# Patient Record
Sex: Male | Born: 1950 | Race: White | Hispanic: No | Marital: Married | Smoking: Never smoker
Health system: Southern US, Community
[De-identification: ages and names within clinical notes are randomized; demographics above are authoritative.]

## PROBLEM LIST (undated history)

## (undated) DIAGNOSIS — M67919 Unspecified disorder of synovium and tendon, unspecified shoulder: Secondary | ICD-10-CM

## (undated) DIAGNOSIS — R5383 Other fatigue: Secondary | ICD-10-CM

## (undated) DIAGNOSIS — F329 Major depressive disorder, single episode, unspecified: Secondary | ICD-10-CM

## (undated) DIAGNOSIS — M5030 Other cervical disc degeneration, unspecified cervical region: Secondary | ICD-10-CM

## (undated) DIAGNOSIS — E785 Hyperlipidemia, unspecified: Secondary | ICD-10-CM

## (undated) DIAGNOSIS — M719 Bursopathy, unspecified: Secondary | ICD-10-CM

## (undated) DIAGNOSIS — A4902 Methicillin resistant Staphylococcus aureus infection, unspecified site: Secondary | ICD-10-CM

## (undated) DIAGNOSIS — M7710 Lateral epicondylitis, unspecified elbow: Secondary | ICD-10-CM

## (undated) DIAGNOSIS — E039 Hypothyroidism, unspecified: Secondary | ICD-10-CM

## (undated) DIAGNOSIS — R0600 Dyspnea, unspecified: Secondary | ICD-10-CM

## (undated) DIAGNOSIS — M25519 Pain in unspecified shoulder: Secondary | ICD-10-CM

## (undated) DIAGNOSIS — I10 Essential (primary) hypertension: Secondary | ICD-10-CM

## (undated) DIAGNOSIS — J8410 Pulmonary fibrosis, unspecified: Secondary | ICD-10-CM

## (undated) DIAGNOSIS — F32A Depression, unspecified: Secondary | ICD-10-CM

## (undated) DIAGNOSIS — A6000 Herpesviral infection of urogenital system, unspecified: Secondary | ICD-10-CM

## (undated) DIAGNOSIS — R7301 Impaired fasting glucose: Secondary | ICD-10-CM

## (undated) DIAGNOSIS — M5430 Sciatica, unspecified side: Secondary | ICD-10-CM

## (undated) DIAGNOSIS — E119 Type 2 diabetes mellitus without complications: Secondary | ICD-10-CM

## (undated) DIAGNOSIS — M51369 Other intervertebral disc degeneration, lumbar region without mention of lumbar back pain or lower extremity pain: Secondary | ICD-10-CM

## (undated) DIAGNOSIS — IMO0002 Reserved for concepts with insufficient information to code with codable children: Secondary | ICD-10-CM

## (undated) DIAGNOSIS — M5136 Other intervertebral disc degeneration, lumbar region: Secondary | ICD-10-CM

## (undated) DIAGNOSIS — Z9889 Other specified postprocedural states: Secondary | ICD-10-CM

## (undated) DIAGNOSIS — M1610 Unilateral primary osteoarthritis, unspecified hip: Secondary | ICD-10-CM

## (undated) DIAGNOSIS — M5137 Other intervertebral disc degeneration, lumbosacral region: Secondary | ICD-10-CM

## (undated) HISTORY — DX: Hyperlipidemia, unspecified: E78.5

## (undated) HISTORY — DX: Type 2 diabetes mellitus without complications: E11.9

## (undated) HISTORY — DX: Unspecified disorder of synovium and tendon, unspecified shoulder: M67.919

## (undated) HISTORY — DX: Unilateral primary osteoarthritis, unspecified hip: M16.10

## (undated) HISTORY — DX: Sciatica, unspecified side: M54.30

## (undated) HISTORY — DX: Impaired fasting glucose: R73.01

## (undated) HISTORY — PX: ACHILLES TENDON REPAIR: SUR1153

## (undated) HISTORY — PX: OTHER SURGICAL HISTORY: SHX169

## (undated) HISTORY — DX: Herpesviral infection of urogenital system, unspecified: A60.00

## (undated) HISTORY — DX: Major depressive disorder, single episode, unspecified: F32.9

## (undated) HISTORY — DX: Other intervertebral disc degeneration, lumbar region: M51.36

## (undated) HISTORY — DX: Bursopathy, unspecified: M71.9

## (undated) HISTORY — DX: Reserved for concepts with insufficient information to code with codable children: IMO0002

## (undated) HISTORY — DX: Other fatigue: R53.83

## (undated) HISTORY — DX: Other cervical disc degeneration, unspecified cervical region: M50.30

## (undated) HISTORY — DX: Other intervertebral disc degeneration, lumbar region without mention of lumbar back pain or lower extremity pain: M51.369

## (undated) HISTORY — DX: Depression, unspecified: F32.A

## (undated) HISTORY — DX: Other intervertebral disc degeneration, lumbosacral region: M51.37

## (undated) HISTORY — DX: Essential (primary) hypertension: I10

## (undated) HISTORY — PX: COLONOSCOPY: SHX174

## (undated) HISTORY — DX: Lateral epicondylitis, unspecified elbow: M77.10

## (undated) HISTORY — DX: Hypothyroidism, unspecified: E03.9

## (undated) HISTORY — DX: Pain in unspecified shoulder: M25.519

---

## 1995-02-27 HISTORY — PX: ROTATOR CUFF REPAIR: SHX139

## 2001-07-17 ENCOUNTER — Ambulatory Visit (HOSPITAL_BASED_OUTPATIENT_CLINIC_OR_DEPARTMENT_OTHER): Admission: RE | Admit: 2001-07-17 | Discharge: 2001-07-17 | Payer: Self-pay | Admitting: *Deleted

## 2002-04-01 ENCOUNTER — Encounter: Admission: RE | Admit: 2002-04-01 | Discharge: 2002-04-01 | Payer: Self-pay | Admitting: Neurological Surgery

## 2002-04-01 ENCOUNTER — Encounter: Payer: Self-pay | Admitting: Neurological Surgery

## 2002-07-03 ENCOUNTER — Encounter: Payer: Self-pay | Admitting: Family Medicine

## 2002-07-03 ENCOUNTER — Encounter: Admission: RE | Admit: 2002-07-03 | Discharge: 2002-07-03 | Payer: Self-pay | Admitting: Family Medicine

## 2003-02-27 HISTORY — PX: ELBOW SURGERY: SHX618

## 2003-08-09 ENCOUNTER — Encounter: Admission: RE | Admit: 2003-08-09 | Discharge: 2003-11-07 | Payer: Self-pay | Admitting: Family Medicine

## 2004-02-29 ENCOUNTER — Ambulatory Visit: Payer: Self-pay | Admitting: Family Medicine

## 2004-03-16 ENCOUNTER — Ambulatory Visit: Payer: Self-pay | Admitting: Family Medicine

## 2004-03-27 ENCOUNTER — Ambulatory Visit: Payer: Self-pay | Admitting: Family Medicine

## 2004-07-04 ENCOUNTER — Ambulatory Visit: Payer: Self-pay | Admitting: Family Medicine

## 2004-12-26 ENCOUNTER — Ambulatory Visit: Payer: Self-pay | Admitting: Family Medicine

## 2005-01-11 ENCOUNTER — Ambulatory Visit: Payer: Self-pay | Admitting: Family Medicine

## 2005-02-26 HISTORY — PX: KNEE ARTHROSCOPY: SUR90

## 2005-06-12 ENCOUNTER — Ambulatory Visit: Payer: Self-pay | Admitting: Family Medicine

## 2005-06-19 ENCOUNTER — Ambulatory Visit: Payer: Self-pay | Admitting: Family Medicine

## 2006-01-15 ENCOUNTER — Ambulatory Visit: Payer: Self-pay | Admitting: Family Medicine

## 2006-03-27 ENCOUNTER — Ambulatory Visit: Payer: Self-pay | Admitting: Family Medicine

## 2006-03-27 LAB — CONVERTED CEMR LAB
Cholesterol: 275 mg/dL (ref 0–200)
Hgb A1c MFr Bld: 7.2 % — ABNORMAL HIGH (ref 4.6–6.0)

## 2006-04-21 ENCOUNTER — Ambulatory Visit: Payer: Self-pay | Admitting: Internal Medicine

## 2006-04-21 ENCOUNTER — Inpatient Hospital Stay (HOSPITAL_COMMUNITY): Admission: EM | Admit: 2006-04-21 | Discharge: 2006-04-26 | Payer: Self-pay | Admitting: Podiatry

## 2006-04-30 ENCOUNTER — Ambulatory Visit: Payer: Self-pay | Admitting: Family Medicine

## 2006-07-16 ENCOUNTER — Ambulatory Visit: Payer: Self-pay | Admitting: Family Medicine

## 2006-07-16 LAB — CONVERTED CEMR LAB
Alkaline Phosphatase: 26 units/L — ABNORMAL LOW (ref 39–117)
BUN: 17 mg/dL (ref 6–23)
Basophils Relative: 0.6 % (ref 0.0–1.0)
CO2: 32 meq/L (ref 19–32)
Creatinine, Ser: 1.2 mg/dL (ref 0.4–1.5)
Eosinophils Relative: 8.4 % — ABNORMAL HIGH (ref 0.0–5.0)
Glucose, Bld: 141 mg/dL — ABNORMAL HIGH (ref 70–99)
HCT: 42.9 % (ref 39.0–52.0)
Hemoglobin: 14.8 g/dL (ref 13.0–17.0)
LDL Cholesterol: 119 mg/dL — ABNORMAL HIGH (ref 0–99)
Microalb, Ur: 0.3 mg/dL (ref 0.0–1.9)
Monocytes Absolute: 0.5 10*3/uL (ref 0.2–0.7)
Neutrophils Relative %: 57.8 % (ref 43.0–77.0)
Potassium: 4 meq/L (ref 3.5–5.1)
RDW: 12.5 % (ref 11.5–14.6)
TSH: 4.85 microintl units/mL (ref 0.35–5.50)
Total Bilirubin: 1.1 mg/dL (ref 0.3–1.2)
Total Protein: 7.1 g/dL (ref 6.0–8.3)
VLDL: 32 mg/dL (ref 0–40)
WBC: 6 10*3/uL (ref 4.5–10.5)

## 2006-07-23 ENCOUNTER — Ambulatory Visit: Payer: Self-pay | Admitting: Family Medicine

## 2006-10-02 ENCOUNTER — Encounter: Payer: Self-pay | Admitting: Family Medicine

## 2006-11-25 DIAGNOSIS — F329 Major depressive disorder, single episode, unspecified: Secondary | ICD-10-CM | POA: Insufficient documentation

## 2006-11-25 DIAGNOSIS — I10 Essential (primary) hypertension: Secondary | ICD-10-CM | POA: Insufficient documentation

## 2006-11-25 DIAGNOSIS — F3289 Other specified depressive episodes: Secondary | ICD-10-CM

## 2006-11-25 HISTORY — DX: Essential (primary) hypertension: I10

## 2006-11-25 HISTORY — DX: Major depressive disorder, single episode, unspecified: F32.9

## 2006-11-25 HISTORY — DX: Other specified depressive episodes: F32.89

## 2006-11-27 ENCOUNTER — Ambulatory Visit: Payer: Self-pay | Admitting: Family Medicine

## 2006-11-27 DIAGNOSIS — M5030 Other cervical disc degeneration, unspecified cervical region: Secondary | ICD-10-CM

## 2006-11-27 DIAGNOSIS — M5137 Other intervertebral disc degeneration, lumbosacral region: Secondary | ICD-10-CM | POA: Insufficient documentation

## 2006-11-27 DIAGNOSIS — M51379 Other intervertebral disc degeneration, lumbosacral region without mention of lumbar back pain or lower extremity pain: Secondary | ICD-10-CM

## 2006-11-27 HISTORY — DX: Other intervertebral disc degeneration, lumbosacral region: M51.37

## 2006-11-27 HISTORY — DX: Other intervertebral disc degeneration, lumbosacral region without mention of lumbar back pain or lower extremity pain: M51.379

## 2006-11-27 HISTORY — DX: Other cervical disc degeneration, unspecified cervical region: M50.30

## 2006-11-28 ENCOUNTER — Telehealth: Payer: Self-pay | Admitting: Family Medicine

## 2006-12-12 ENCOUNTER — Telehealth: Payer: Self-pay | Admitting: Family Medicine

## 2006-12-26 ENCOUNTER — Ambulatory Visit: Payer: Self-pay | Admitting: Family Medicine

## 2006-12-26 DIAGNOSIS — R7301 Impaired fasting glucose: Secondary | ICD-10-CM

## 2006-12-26 HISTORY — DX: Impaired fasting glucose: R73.01

## 2007-01-13 ENCOUNTER — Ambulatory Visit: Payer: Self-pay | Admitting: Internal Medicine

## 2007-01-13 DIAGNOSIS — R300 Dysuria: Secondary | ICD-10-CM | POA: Insufficient documentation

## 2007-01-13 LAB — CONVERTED CEMR LAB
Blood in Urine, dipstick: NEGATIVE
Nitrite: NEGATIVE
Protein, U semiquant: NEGATIVE
WBC Urine, dipstick: NEGATIVE

## 2007-01-14 ENCOUNTER — Encounter: Payer: Self-pay | Admitting: Internal Medicine

## 2007-01-16 ENCOUNTER — Encounter: Payer: Self-pay | Admitting: Family Medicine

## 2007-01-16 LAB — CONVERTED CEMR LAB
Cholesterol: 189 mg/dL (ref 0–200)
Hgb A1c MFr Bld: 6.5 % — ABNORMAL HIGH (ref 4.6–6.0)
Total CHOL/HDL Ratio: 4.5
Triglycerides: 181 mg/dL — ABNORMAL HIGH (ref 0–149)

## 2007-02-05 ENCOUNTER — Telehealth: Payer: Self-pay | Admitting: Family Medicine

## 2007-03-31 ENCOUNTER — Ambulatory Visit: Payer: Self-pay | Admitting: Cardiology

## 2007-03-31 ENCOUNTER — Ambulatory Visit: Payer: Self-pay | Admitting: Family Medicine

## 2007-03-31 ENCOUNTER — Ambulatory Visit: Payer: Self-pay | Admitting: Internal Medicine

## 2007-03-31 DIAGNOSIS — E785 Hyperlipidemia, unspecified: Secondary | ICD-10-CM

## 2007-03-31 HISTORY — DX: Hyperlipidemia, unspecified: E78.5

## 2007-04-01 ENCOUNTER — Encounter: Payer: Self-pay | Admitting: Family Medicine

## 2007-04-02 ENCOUNTER — Inpatient Hospital Stay (HOSPITAL_COMMUNITY): Admission: EM | Admit: 2007-04-02 | Discharge: 2007-04-03 | Payer: Self-pay | Admitting: Emergency Medicine

## 2007-04-03 ENCOUNTER — Encounter: Payer: Self-pay | Admitting: Internal Medicine

## 2007-04-10 ENCOUNTER — Ambulatory Visit: Payer: Self-pay | Admitting: Family Medicine

## 2007-04-10 DIAGNOSIS — E119 Type 2 diabetes mellitus without complications: Secondary | ICD-10-CM | POA: Insufficient documentation

## 2007-04-10 DIAGNOSIS — R079 Chest pain, unspecified: Secondary | ICD-10-CM | POA: Insufficient documentation

## 2007-04-10 HISTORY — DX: Type 2 diabetes mellitus without complications: E11.9

## 2007-04-10 LAB — CONVERTED CEMR LAB
Hgb A1c MFr Bld: 6.9 % — ABNORMAL HIGH (ref 4.6–6.0)
Total CHOL/HDL Ratio: 4.9
Triglycerides: 106 mg/dL (ref 0–149)
VLDL: 21 mg/dL (ref 0–40)

## 2007-04-29 ENCOUNTER — Telehealth: Payer: Self-pay | Admitting: Family Medicine

## 2007-04-29 ENCOUNTER — Ambulatory Visit: Payer: Self-pay | Admitting: Family Medicine

## 2007-05-05 DIAGNOSIS — M7710 Lateral epicondylitis, unspecified elbow: Secondary | ICD-10-CM | POA: Insufficient documentation

## 2007-05-05 HISTORY — DX: Lateral epicondylitis, unspecified elbow: M77.10

## 2007-05-07 ENCOUNTER — Encounter: Payer: Self-pay | Admitting: Family Medicine

## 2007-06-04 ENCOUNTER — Telehealth: Payer: Self-pay | Admitting: Family Medicine

## 2007-07-16 ENCOUNTER — Telehealth: Payer: Self-pay | Admitting: Family Medicine

## 2007-07-22 ENCOUNTER — Ambulatory Visit: Payer: Self-pay | Admitting: Family Medicine

## 2007-07-22 LAB — CONVERTED CEMR LAB
Nitrite: NEGATIVE
Specific Gravity, Urine: 1.01
WBC Urine, dipstick: NEGATIVE

## 2007-07-30 ENCOUNTER — Ambulatory Visit: Payer: Self-pay | Admitting: Family Medicine

## 2007-07-30 LAB — CONVERTED CEMR LAB
ALT: 25 units/L (ref 0–53)
AST: 19 units/L (ref 0–37)
Albumin: 4.3 g/dL (ref 3.5–5.2)
Alkaline Phosphatase: 35 units/L — ABNORMAL LOW (ref 39–117)
BUN: 14 mg/dL (ref 6–23)
CO2: 30 meq/L (ref 19–32)
Calcium: 9.6 mg/dL (ref 8.4–10.5)
Creatinine,U: 65.6 mg/dL
Eosinophils Relative: 5 % (ref 0.0–5.0)
GFR calc Af Amer: 89 mL/min
Glucose, Bld: 157 mg/dL — ABNORMAL HIGH (ref 70–99)
HCT: 41.7 % (ref 39.0–52.0)
HDL: 35.5 mg/dL — ABNORMAL LOW (ref >39.0)
Hemoglobin: 14.3 g/dL (ref 13.0–17.0)
Microalb Creat Ratio: 6.1 mg/g (ref 0.0–30.0)
Monocytes Absolute: 0.5 10*3/uL (ref 0.1–1.0)
Monocytes Relative: 8.9 % (ref 3.0–12.0)
Neutro Abs: 3.6 10*3/uL (ref 1.4–7.7)
Platelets: 167 10*3/uL (ref 150–400)
Potassium: 4.6 meq/L (ref 3.5–5.1)
RDW: 12.7 % (ref 11.5–14.6)
Total CHOL/HDL Ratio: 5.1
Total Protein: 7.1 g/dL (ref 6.0–8.3)
WBC: 5.7 10*3/uL (ref 4.5–10.5)

## 2007-08-04 LAB — CONVERTED CEMR LAB: Herpes Simplex Vrs I&II-IgM Ab (EIA): NOT DETECTED

## 2007-08-05 ENCOUNTER — Ambulatory Visit: Payer: Self-pay | Admitting: Family Medicine

## 2007-08-07 ENCOUNTER — Telehealth: Payer: Self-pay | Admitting: Family Medicine

## 2007-08-12 ENCOUNTER — Telehealth: Payer: Self-pay | Admitting: Family Medicine

## 2007-08-13 ENCOUNTER — Ambulatory Visit: Payer: Self-pay | Admitting: Family Medicine

## 2007-08-28 ENCOUNTER — Ambulatory Visit: Payer: Self-pay | Admitting: Family Medicine

## 2007-08-28 DIAGNOSIS — B356 Tinea cruris: Secondary | ICD-10-CM | POA: Insufficient documentation

## 2007-09-17 ENCOUNTER — Ambulatory Visit: Payer: Self-pay | Admitting: Family Medicine

## 2007-09-17 DIAGNOSIS — A6000 Herpesviral infection of urogenital system, unspecified: Secondary | ICD-10-CM

## 2007-09-17 DIAGNOSIS — T887XXA Unspecified adverse effect of drug or medicament, initial encounter: Secondary | ICD-10-CM | POA: Insufficient documentation

## 2007-09-17 HISTORY — DX: Herpesviral infection of urogenital system, unspecified: A60.00

## 2007-09-30 ENCOUNTER — Telehealth: Payer: Self-pay | Admitting: Family Medicine

## 2007-10-14 ENCOUNTER — Telehealth: Payer: Self-pay | Admitting: Family Medicine

## 2007-10-15 ENCOUNTER — Telehealth: Payer: Self-pay | Admitting: *Deleted

## 2007-10-28 ENCOUNTER — Ambulatory Visit: Payer: Self-pay | Admitting: Family Medicine

## 2007-10-28 DIAGNOSIS — M5430 Sciatica, unspecified side: Secondary | ICD-10-CM

## 2007-10-28 DIAGNOSIS — E039 Hypothyroidism, unspecified: Secondary | ICD-10-CM

## 2007-10-28 HISTORY — DX: Hypothyroidism, unspecified: E03.9

## 2007-10-28 HISTORY — DX: Sciatica, unspecified side: M54.30

## 2007-11-04 LAB — CONVERTED CEMR LAB
Hgb A1c MFr Bld: 6.8 % — ABNORMAL HIGH (ref 4.6–6.0)
TSH: 7.94 microintl units/mL — ABNORMAL HIGH (ref 0.35–5.50)
Total Protein: 7.2 g/dL (ref 6.0–8.3)

## 2007-11-19 ENCOUNTER — Ambulatory Visit: Payer: Self-pay | Admitting: Family Medicine

## 2007-11-19 LAB — CONVERTED CEMR LAB
Glucose, Urine, Semiquant: NEGATIVE
Nitrite: NEGATIVE
Specific Gravity, Urine: 1.01
WBC Urine, dipstick: NEGATIVE
pH: 6.5

## 2007-12-10 ENCOUNTER — Ambulatory Visit: Payer: Self-pay | Admitting: Family Medicine

## 2007-12-31 ENCOUNTER — Telehealth: Payer: Self-pay | Admitting: Family Medicine

## 2008-04-01 ENCOUNTER — Telehealth: Payer: Self-pay | Admitting: Family Medicine

## 2008-04-08 ENCOUNTER — Ambulatory Visit: Payer: Self-pay | Admitting: Family Medicine

## 2008-04-08 DIAGNOSIS — B002 Herpesviral gingivostomatitis and pharyngotonsillitis: Secondary | ICD-10-CM | POA: Insufficient documentation

## 2008-05-05 ENCOUNTER — Telehealth: Payer: Self-pay | Admitting: Family Medicine

## 2008-05-11 ENCOUNTER — Telehealth: Payer: Self-pay | Admitting: Family Medicine

## 2008-05-20 ENCOUNTER — Ambulatory Visit: Payer: Self-pay | Admitting: Family Medicine

## 2008-05-20 DIAGNOSIS — M67919 Unspecified disorder of synovium and tendon, unspecified shoulder: Secondary | ICD-10-CM

## 2008-05-20 HISTORY — DX: Unspecified disorder of synovium and tendon, unspecified shoulder: M67.919

## 2008-05-27 ENCOUNTER — Ambulatory Visit: Payer: Self-pay | Admitting: Family Medicine

## 2008-05-27 DIAGNOSIS — K121 Other forms of stomatitis: Secondary | ICD-10-CM | POA: Insufficient documentation

## 2008-05-27 DIAGNOSIS — K1230 Oral mucositis (ulcerative), unspecified: Secondary | ICD-10-CM

## 2008-05-27 LAB — CONVERTED CEMR LAB
ALT: 28 units/L (ref 0–53)
Alkaline Phosphatase: 33 units/L — ABNORMAL LOW (ref 39–117)
Bilirubin, Direct: 0.2 mg/dL (ref 0.0–0.3)
Total Bilirubin: 0.8 mg/dL (ref 0.3–1.2)
Total Protein: 7 g/dL (ref 6.0–8.3)

## 2008-05-28 ENCOUNTER — Encounter: Payer: Self-pay | Admitting: Family Medicine

## 2008-06-01 ENCOUNTER — Telehealth: Payer: Self-pay | Admitting: Family Medicine

## 2008-06-03 ENCOUNTER — Telehealth: Payer: Self-pay | Admitting: Family Medicine

## 2008-06-15 ENCOUNTER — Telehealth: Payer: Self-pay | Admitting: Family Medicine

## 2008-07-08 ENCOUNTER — Ambulatory Visit: Payer: Self-pay | Admitting: Family Medicine

## 2008-07-08 LAB — CONVERTED CEMR LAB
Blood in Urine, dipstick: NEGATIVE
Ketones, urine, test strip: NEGATIVE
Nitrite: NEGATIVE
Protein, U semiquant: NEGATIVE
Specific Gravity, Urine: <1.005
WBC Urine, dipstick: NEGATIVE
pH: 7

## 2008-07-13 ENCOUNTER — Telehealth (INDEPENDENT_AMBULATORY_CARE_PROVIDER_SITE_OTHER): Payer: Self-pay | Admitting: *Deleted

## 2008-07-15 ENCOUNTER — Ambulatory Visit: Payer: Self-pay | Admitting: Family Medicine

## 2008-07-20 ENCOUNTER — Encounter: Payer: Self-pay | Admitting: Family Medicine

## 2008-08-03 ENCOUNTER — Ambulatory Visit: Payer: Self-pay | Admitting: Family Medicine

## 2008-08-09 ENCOUNTER — Encounter: Payer: Self-pay | Admitting: Family Medicine

## 2008-08-10 ENCOUNTER — Ambulatory Visit: Payer: Self-pay | Admitting: Family Medicine

## 2008-08-10 DIAGNOSIS — M1610 Unilateral primary osteoarthritis, unspecified hip: Secondary | ICD-10-CM | POA: Insufficient documentation

## 2008-08-10 HISTORY — DX: Unilateral primary osteoarthritis, unspecified hip: M16.10

## 2008-08-13 ENCOUNTER — Telehealth: Payer: Self-pay | Admitting: Family Medicine

## 2008-08-18 ENCOUNTER — Ambulatory Visit: Payer: Self-pay | Admitting: Family Medicine

## 2008-08-18 DIAGNOSIS — M25519 Pain in unspecified shoulder: Secondary | ICD-10-CM

## 2008-08-18 HISTORY — DX: Pain in unspecified shoulder: M25.519

## 2008-08-19 ENCOUNTER — Telehealth: Payer: Self-pay | Admitting: Family Medicine

## 2008-08-20 ENCOUNTER — Encounter: Payer: Self-pay | Admitting: Family Medicine

## 2008-09-07 ENCOUNTER — Encounter: Admission: RE | Admit: 2008-09-07 | Discharge: 2008-09-07 | Payer: Self-pay | Admitting: Sports Medicine

## 2008-09-13 ENCOUNTER — Ambulatory Visit: Payer: Self-pay | Admitting: Family Medicine

## 2008-09-13 LAB — CONVERTED CEMR LAB
Bilirubin Urine: NEGATIVE
Blood in Urine, dipstick: NEGATIVE
Ketones, urine, test strip: NEGATIVE
Nitrite: NEGATIVE
Specific Gravity, Urine: 1.015
Urobilinogen, UA: 0.2

## 2008-09-21 ENCOUNTER — Encounter: Admission: RE | Admit: 2008-09-21 | Discharge: 2008-09-21 | Payer: Self-pay | Admitting: Sports Medicine

## 2008-09-21 ENCOUNTER — Ambulatory Visit: Payer: Self-pay | Admitting: Family Medicine

## 2008-09-21 LAB — CONVERTED CEMR LAB
Blood in Urine, dipstick: NEGATIVE
Nitrite: NEGATIVE
Specific Gravity, Urine: 1.02

## 2008-09-22 ENCOUNTER — Telehealth: Payer: Self-pay | Admitting: Family Medicine

## 2008-09-29 ENCOUNTER — Ambulatory Visit: Payer: Self-pay | Admitting: Family Medicine

## 2008-10-01 LAB — CONVERTED CEMR LAB
AST: 20 units/L (ref 0–37)
Albumin: 4.3 g/dL (ref 3.5–5.2)
Alkaline Phosphatase: 32 units/L — ABNORMAL LOW (ref 39–117)
BUN: 16 mg/dL (ref 6–23)
Basophils Absolute: 0 10*3/uL (ref 0.0–0.1)
Bilirubin, Direct: 0.1 mg/dL (ref 0.0–0.3)
CO2: 33 meq/L — ABNORMAL HIGH (ref 19–32)
Calcium: 9.6 mg/dL (ref 8.4–10.5)
Cholesterol: 206 mg/dL — ABNORMAL HIGH (ref 0–200)
Creatinine, Ser: 1.1 mg/dL (ref 0.4–1.5)
Creatinine,U: 132.9 mg/dL
Direct LDL: 132.2 mg/dL
Eosinophils Absolute: 0.3 10*3/uL (ref 0.0–0.7)
GFR calc non Af Amer: 73.44 mL/min (ref >60)
Glucose, Bld: 148 mg/dL — ABNORMAL HIGH (ref 70–99)
HDL: 55.6 mg/dL (ref >39.00)
Lymphocytes Relative: 25.2 % (ref 12.0–46.0)
MCHC: 34.6 g/dL (ref 30.0–36.0)
Microalb Creat Ratio: 4.5 mg/g (ref 0.0–30.0)
Microalb, Ur: 0.6 mg/dL (ref 0.0–1.9)
Monocytes Relative: 8.4 % (ref 3.0–12.0)
Neutro Abs: 5.1 10*3/uL (ref 1.4–7.7)
Neutrophils Relative %: 62.7 % (ref 43.0–77.0)
Platelets: 174 10*3/uL (ref 150.0–400.0)
RDW: 12.6 % (ref 11.5–14.6)
Sodium: 143 meq/L (ref 135–145)
Total Bilirubin: 1.1 mg/dL (ref 0.3–1.2)
Triglycerides: 167 mg/dL — ABNORMAL HIGH (ref 0.0–149.0)

## 2008-10-04 ENCOUNTER — Encounter: Payer: Self-pay | Admitting: Family Medicine

## 2008-10-05 ENCOUNTER — Telehealth: Payer: Self-pay | Admitting: Family Medicine

## 2008-10-06 ENCOUNTER — Encounter: Payer: Self-pay | Admitting: Family Medicine

## 2008-10-27 ENCOUNTER — Ambulatory Visit: Payer: Self-pay | Admitting: Family Medicine

## 2008-11-10 ENCOUNTER — Telehealth: Payer: Self-pay | Admitting: Family Medicine

## 2008-12-14 ENCOUNTER — Telehealth: Payer: Self-pay | Admitting: Family Medicine

## 2008-12-20 ENCOUNTER — Ambulatory Visit: Payer: Self-pay | Admitting: Family Medicine

## 2008-12-21 LAB — CONVERTED CEMR LAB: Hgb A1c MFr Bld: 7.3 % — ABNORMAL HIGH (ref 4.6–6.5)

## 2009-01-11 ENCOUNTER — Encounter (INDEPENDENT_AMBULATORY_CARE_PROVIDER_SITE_OTHER): Payer: Self-pay | Admitting: *Deleted

## 2009-03-02 ENCOUNTER — Encounter: Payer: Self-pay | Admitting: Family Medicine

## 2009-03-02 ENCOUNTER — Encounter: Admission: RE | Admit: 2009-03-02 | Discharge: 2009-03-02 | Payer: Self-pay | Admitting: Neurosurgery

## 2009-04-25 ENCOUNTER — Encounter: Payer: Self-pay | Admitting: Family Medicine

## 2009-05-25 ENCOUNTER — Telehealth: Payer: Self-pay | Admitting: Family Medicine

## 2009-06-02 ENCOUNTER — Ambulatory Visit: Payer: Self-pay | Admitting: Family Medicine

## 2009-06-08 LAB — CONVERTED CEMR LAB: Hgb A1c MFr Bld: 7.8 % — ABNORMAL HIGH (ref 4.6–6.5)

## 2009-08-31 ENCOUNTER — Telehealth: Payer: Self-pay | Admitting: Family Medicine

## 2009-09-15 ENCOUNTER — Ambulatory Visit: Payer: Self-pay | Admitting: Family Medicine

## 2009-09-22 ENCOUNTER — Ambulatory Visit: Payer: Self-pay | Admitting: Family Medicine

## 2009-09-25 LAB — CONVERTED CEMR LAB
BUN: 19 mg/dL (ref 6–23)
Basophils Absolute: 0 10*3/uL (ref 0.0–0.1)
Basophils Relative: 0.7 % (ref 0.0–3.0)
Bilirubin Urine: NEGATIVE
Bilirubin, Direct: 0.1 mg/dL (ref 0.0–0.3)
Blood in Urine, dipstick: NEGATIVE
CO2: 30 meq/L (ref 19–32)
Chloride: 104 meq/L (ref 96–112)
Cholesterol: 170 mg/dL (ref 0–200)
Creatinine, Ser: 1.2 mg/dL (ref 0.4–1.5)
Eosinophils Absolute: 0.3 10*3/uL (ref 0.0–0.7)
HDL: 42.3 mg/dL (ref >39.00)
Hgb A1c MFr Bld: 7.9 % — ABNORMAL HIGH (ref 4.6–6.5)
Ketones, urine, test strip: NEGATIVE
MCHC: 34.5 g/dL (ref 30.0–36.0)
MCV: 90.9 fL (ref 78.0–100.0)
Microalb Creat Ratio: 0.9 mg/g (ref 0.0–30.0)
Monocytes Absolute: 0.6 10*3/uL (ref 0.1–1.0)
Neutrophils Relative %: 55.5 % (ref 43.0–77.0)
PSA: 2.06 ng/mL (ref 0.10–4.00)
Platelets: 170 10*3/uL (ref 150.0–400.0)
RDW: 12.7 % (ref 11.5–14.6)
Specific Gravity, Urine: 1.02
Total Bilirubin: 0.8 mg/dL (ref 0.3–1.2)
Total Protein: 6.8 g/dL (ref 6.0–8.3)
Triglycerides: 155 mg/dL — ABNORMAL HIGH (ref 0.0–149.0)
Urobilinogen, UA: 0.2

## 2009-09-29 ENCOUNTER — Telehealth: Payer: Self-pay | Admitting: Family Medicine

## 2009-12-13 ENCOUNTER — Telehealth: Payer: Self-pay | Admitting: Family Medicine

## 2009-12-13 ENCOUNTER — Telehealth (INDEPENDENT_AMBULATORY_CARE_PROVIDER_SITE_OTHER): Payer: Self-pay | Admitting: *Deleted

## 2009-12-20 ENCOUNTER — Telehealth: Payer: Self-pay | Admitting: Family Medicine

## 2009-12-21 ENCOUNTER — Ambulatory Visit: Payer: Self-pay | Admitting: Family Medicine

## 2009-12-22 ENCOUNTER — Telehealth (INDEPENDENT_AMBULATORY_CARE_PROVIDER_SITE_OTHER): Payer: Self-pay | Admitting: *Deleted

## 2009-12-23 ENCOUNTER — Telehealth: Payer: Self-pay | Admitting: Family Medicine

## 2009-12-23 ENCOUNTER — Encounter: Payer: Self-pay | Admitting: Family Medicine

## 2009-12-23 ENCOUNTER — Ambulatory Visit: Payer: Self-pay | Admitting: Family Medicine

## 2009-12-29 ENCOUNTER — Encounter: Payer: Self-pay | Admitting: Family Medicine

## 2010-01-27 ENCOUNTER — Telehealth: Payer: Self-pay | Admitting: Family Medicine

## 2010-02-02 ENCOUNTER — Encounter: Payer: Self-pay | Admitting: Family Medicine

## 2010-02-14 ENCOUNTER — Telehealth: Payer: Self-pay | Admitting: Family Medicine

## 2010-03-20 ENCOUNTER — Encounter: Payer: Self-pay | Admitting: Sports Medicine

## 2010-03-23 ENCOUNTER — Telehealth: Payer: Self-pay | Admitting: Family Medicine

## 2010-03-25 ENCOUNTER — Encounter: Payer: Self-pay | Admitting: Family Medicine

## 2010-03-29 ENCOUNTER — Telehealth: Payer: Self-pay | Admitting: Family Medicine

## 2010-03-29 NOTE — Telephone Encounter (Signed)
Pt called and said that Dr Scotty Court spoke to pt on Tues 03/28/10 and said that he would have letter ready for pt pick up [id number]. Pt called back to get status of letter. Pls call when ready for pick up.

## 2010-03-29 NOTE — Telephone Encounter (Signed)
Per Dr. Scotty Court, he did speak with the pt but did not tell pt that he would write the letter. He told pt that he would call him

## 2010-03-30 ENCOUNTER — Encounter: Payer: Self-pay | Admitting: Family Medicine

## 2010-03-30 NOTE — Progress Notes (Signed)
Summary: sooner ov  Phone Note Call from Patient Call back at Home Phone [phone]   Caller: Patient Call For: Judithann Sheen MD Summary of Call: pt has sch ov with doc on 01/05/2010. pt is requesting sooner ov for consultation. Can I work him in ? Initial call taken by: Heron Sabins,  December 13, 2009 10:46 AM  Follow-up for Phone Call        we have spots tomorrow? Its okay if hes willing to see Dr Abner Greenspan? Is he wanting today? Follow-up by: Josph Macho RMA,  December 13, 2009 11:05 AM  Additional Follow-up for Phone Call Additional follow up Details #1::        pt unable to come in tomorrow due to back surgery sch for tomorrow. Pt stated he will be down for about 2 wks Additional Follow-up by: Heron Sabins,  December 13, 2009 11:21 AM    Additional Follow-up for Phone Call Additional follow up Details #2::    Pt states he is having back surgery tomorrow and wants Dr Abner Greenspan to look over notes for dissability? Pt states he could lose his job if paperwork doesnt' list everything that is wrong with him? I informed pt that the dissability should be done for his back surgery.  Per md pt would have to come in after back surgery to get paperwork completed with other problems. Back surgery is enough for disability. I will inform pt.  Pt states he would like Gina to return his call. Follow-up by: Josph Macho RMA,  December 13, 2009 11:34 AM  Additional Follow-up for Phone Call Additional follow up Details #3:: Details for Additional Follow-up Action Taken: states has excellent chance going out on retirement disabiltiy. needs to be seen by the 10 th of NOv. 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 [facility] --putting plates  in the facets  by  Dr Sterling Big. pt stateas really needs to see dr Suzanne Boron but is aware he is not here at the time.  MESS READ BACK TO PT AND PT VERBALIZED CORRECT.  I can not help him with his current predicament, he would have to come in to discuss in more detail Additional Follow-up by: Pura Spice, RN,  December 13, 2009 12:02 PM  Pt would like to know when he could be seen? I informed pt that he would have to let me know since he is having back surgery tomorrow? Pt then stated that hes not sure if hes having back surgery due to hes waiting on workmans comp to finalize something?  Resolved, spoke with patient.  Trixie Dredge  December 15, 2009 9:17 AM

## 2010-03-30 NOTE — Letter (Signed)
Summary: [facility]  [facility]   Imported By: Maryln Gottron 06/02/2009 13:27:00  _____________________________________________________________________  External Attachment:    Type:   Image     Comment:   External Document

## 2010-03-30 NOTE — Progress Notes (Signed)
Summary: Pt needs to get referral for pain management  Phone Note Call from Patient Call back at Home Phone [phone]   Caller: Patient Summary of Call: Pt called and said that Workers Comp is req that he get pain management for back injury and pt is req a referral from Dr. Scotty Court. Initial call taken by: Lucy Antigua,  August 31, 2009 8:45 AM  Follow-up for Phone Call        spoke with pt he stated no rush in this referrral but thinking may be the route to follow. Informed pt since has appt july 28 this could be discussed at time of appt or appt next week.  pt verbalized no rush and would discuss july 28 with dr stafffford appt.  Follow-up by: Pura Spice, RN,  August 31, 2009 9:31 AM

## 2010-03-30 NOTE — Progress Notes (Signed)
Summary: doc to rtc  Phone Note Call from Patient   Caller: Patient Call For: Judithann Sheen MD Summary of Call: pt would like dr blyth to call him concerning work issue. Initial call taken by: Heron Sabins,  December 13, 2009 10:49 AM  Follow-up for Phone Call        Pt called and said that it is imperative that he gets a call back asap. Pt is req that [place] call him back. Pls call. [phone]. Follow-up by: Lucy Antigua,  December 13, 2009 11:24 AM  Additional Follow-up for Phone Call Additional follow up Details #1::        Patient would have to come for a visit to evaluate Additional Follow-up by: Danise Edge MD,  December 13, 2009 1:51 PM    Additional Follow-up for Phone Call Additional follow up Details #2::    Informed pt. Look at previous note. Follow-up by: Josph Macho RMA,  December 13, 2009 2:08 PM

## 2010-03-30 NOTE — Letter (Signed)
Summary: Generic Letter  [place] [facility]  [address] [place], [place] [address]   Phone: [phone]  Fax: [phone]    12/23/2009  Ricardo [name] [address] [place], [place]  [address]  Dear Mr. Doorn,           Sincerely,   Danise Edge MD

## 2010-03-30 NOTE — Letter (Signed)
Summary: [facility]  [facility]   Imported By: Maryln Gottron 01/12/2010 10:19:53  _____________________________________________________________________  External Attachment:    Type:   Image     Comment:   External Document

## 2010-03-30 NOTE — Progress Notes (Signed)
Summary: lab oked  Phone Note Call from Patient Call back at Home Phone [phone]   Caller: Patient Call For: Judithann Sheen MD Summary of Call: pt is sch for a1c on 06-01-2009 Initial call taken by: Heron Sabins,  May 25, 2009 11:51 AM  Follow-up for Phone Call        ok  Follow-up by: Pura Spice, RN,  May 25, 2009 12:36 PM

## 2010-03-30 NOTE — Letter (Signed)
Summary: Generic Letter  [place] [facility]  [address] [place], [place] [address]   Phone: [phone]  Fax: [phone]    12/23/2009  Ricardo [name] [address] [place], [place]  [address]  Dear [name],  [name] is a patient of [facility] and has been under the care of his Primary Medical Doctor, Dr Dianna Limbo for over 20 years. He has a complicated medical history and has been off of work for quite some time due to medical conditions.  Review of the chart reveals disability has been linked to these medical conditions. 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

## 2010-03-30 NOTE — Progress Notes (Signed)
Summary: QUESTION CONCERNING MED / HYPERGLYCEMIA  Phone Note Call from Patient   Caller: Patient  [phone] Summary of Call: Pt called to speak with Dr Abner Greenspan.... Pt states that he was just in for OV yesterday but his blood sugar was over 200 this morning and now it is 361 - adv he only had oatmeal to eat.... Wants to know if he should increase his med due to same?  Pt can be reached at [phone]   [phone]  to advise.   Initial call taken by: Debbra Riding,  December 22, 2009 12:15 PM  Follow-up for Phone Call        he should increase his Glipizide to 10mg  by mouth two times a day as we discussed at visit until his numbers keep staying above 200 and eat minimal carbs and lots of lean proteins Follow-up by: Danise Edge MD,  December 22, 2009 12:53 PM  Additional Follow-up for Phone Call Additional follow up Details #1::        Pt informed Additional Follow-up by: Josph Macho RMA,  December 23, 2009 9:03 AM

## 2010-03-30 NOTE — Assessment & Plan Note (Signed)
Summary: DISCUSS DISABILITY FORMS? // RS   Vital Signs:  Patient profile:   60 year old [name] Height:      66 inches (167.64 cm) Weight:      172 pounds (78.18 kg) O2 Sat:      95 % on Room air Temp:     98.8 degrees F (37.11 degrees C) oral Pulse rate:   100 / minute BP sitting:   150 / 78  (left arm) Cuff size:   regular  Vitals Entered By: Josph Macho RMA (December 21, 2009 2:45 PM)  O2 Flow:  Room air CC: Discuss Disability Forms/ CF Is Patient Diabetic? Yes   History of Present Illness: Patient is a 60 yo [place] [name] in today for help get paperwork completed. He is requesting that we compose a letter for him similar to a letter composed by his PMD, Dr. Scotty Court in 2005. 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 reports he had back surgery at [facility] on October 19 but performed by Dr. Glenetta Hew he says it was in the lumbar back he believes a fusion at L3 and L4 and a note hardware has been left in place. He notes he denied any fever or pain is actually improving since the surgery he denies any swelling, redness or discharge from the lesion. He is soon to see Dr. Glenetta Hew in followup. He is denying incontinence or radicular symptoms at this time. Is requesting that we compiled a list of his medical conditions which include herniated lumbar disc status post surgical correction and degenerative arthritis of the spine lateral epicondylitis carpal tunnel syndrome cervical disc herniation depression diabetes mellitus Achilles tendon injury arthritis in the hip knee injury hypertension hyperthyroidism. He is reporting poor control his blood sugars since her surgery 2-300 is not unusual. He says prior to surgery he was keeping his blood sugars between 1:30 and 180. 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) 12)  Adverse Drug Reaction  (ICD-995.20) 13)  Dermatophytosis of Groin and Perianal Area  (ICD-110.3) 14)  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) 15)  Physical Examination  (ICD-V70.0) 16)  Lateral Epicondylitis, Bilateral  (ICD-726.32) 17)  Diabetes Mellitus, Type II  (ICD-250.00) 18)  Chest Pain  (ICD-786.50) 19)  Hyperlipidemia  (ICD-272.4) 20)  Dysuria  (ICD-788.1) 21)  Impaired Fasting Glucose  (ICD-790.21) 22)  Dm W/complication Nos, Type II  (ICD-250.90) 23)  Disc Disease, Lumbar  (ICD-722.52) 24)  Degeneration, Cervical Disc  (ICD-722.4) 25)  Depression  (ICD-311) 26)  Hypertension  (ICD-401.9)  Current Problems (verified): 1)  Degenerative Disc Disease, Lumbosacral Spine  (ICD-722.52) 2)  Shoulder Pain, Right  (ICD-719.41) 3)  Arthritis, Hip  (ICD-716.95) 4)  Lateral Epicondylitis, Right  (ICD-726.32) 5)  Bursitis, Right Shoulder  (ICD-726.10) 6)  Stomatitis and Mucositis Unspecified  (ICD-528.00) 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) 9)  Sciatica  (ICD-724.3) 10)  Hypothyroidism  (ICD-244.9) 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) 12)  Adverse Drug Reaction  (ICD-995.20) 13)  Dermatophytosis of Groin and Perianal Area  (ICD-110.3) 14)  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 Take 1 Tablet By Mouth Once A Day 2)  Adult Aspirin Low Strength 81 Mg  Tbdp (Aspirin) 3)  Onetouch Ultra Test   Strp (Glucose Blood) .... Test As Directed 4)  Lisinopril 20 Mg  Tabs (Lisinopril) .... 1/2 Tablet By Mouth Once Daily 5)  Glipizide 10 Mg  Tb24 (Glipizide) .[name].. 1 By Mouth Once Daily 6)  Acyclovir 400 Mg Tabs (Acyclovir) .... Once Daily 7)  Diclofenac Sodium Ricardo Mg Tbec (Diclofenac Sodium) .[name].. 1  Two Times A Day For Inflammation 8)  Metformin Hcl 1000 Mg Tabs (Metformin Hcl) .[name].. 1 Bid 9)  Synthroid 100 Mcg Tabs (Levothyroxine Sodium) .[name].. 1 Qd  Current Medications (verified): 1)  Crestor 10 Mg Tabs (Rosuvastatin Calcium) .... Take 1 Tablet By Mouth Once A Day 2)  Adult Aspirin Low Strength 81 Mg  Tbdp (Aspirin) 3)  Onetouch Ultra Test   Strp (Glucose Blood) .... Test As Directed 4)  Lisinopril 20 Mg  Tabs (Lisinopril) .... 1/2 Tablet By Mouth Once Daily 5)  Glipizide 10 Mg  Tb24 (Glipizide) .[name].. 1 By Mouth Once Daily 6)  Acyclovir 400 Mg Tabs (Acyclovir) .... Once Daily 7)  Diclofenac Sodium Ricardo Mg Tbec (Diclofenac Sodium) .[name].. 1  Two Times A Day For Inflammation 8)  Metformin Hcl 1000 Mg Tabs (Metformin Hcl) .[name].. 1 Bid 9)  Synthroid 100 Mcg Tabs (Levothyroxine Sodium) .[name].. 1 Qd  Allergies (verified): No Known Drug Allergies  Past History:  Past medical history reviewed for relevance to current acute and chronic problems. Social history (including risk factors) reviewed for relevance to current acute and chronic problems.  Past Medical History: Reviewed history from 09/22/2009 and no changes required. High Cholesterol Hypertension Depression Fatigue degenerative  disc disease lumbar spine Diabetes mellitus type 2 B. simplex 2 Bilateral epicondylitis  Social History: Reviewed history from 11/25/2006 and no changes required. Occupation: Married Never Smoked Alcohol use-yes Drug use-no Regular exercise-no  Review of Systems      See HPI  Physical Exam  General:  Well-developed,well-nourished,in no acute distress; alert,appropriate and cooperative throughout examination Head:  Normocephalic and atraumatic without obvious abnormalities. No apparent alopecia or balding. Nose:  External nasal examination shows no deformity or inflammation. Nasal mucosa are pink and moist without lesions or exudates. Mouth:  Oral mucosa and oropharynx without lesions or exudates.  Teeth in good repair. Neck:  No deformities, masses, or tenderness noted. Lungs:  Normal respiratory effort, chest expands symmetrically. Lungs are clear to auscultation, no crackles or wheezes. Heart:  Normal rate and regular rhythm. S1 and S2 normal without gallop, murmur, click, rub or other extra sounds. Abdomen:  Bowel sounds positive,abdomen soft and non-tender without masses, organomegaly or hernias noted. Msk:  Has a brace covering low back on today Extremities:  No  clubbing, cyanosis, edema, or deformity noted with normal full range of motion of all joints.   Neurologic:  No cranial nerve deficits noted. Station and gait are normal. Plantar reflexes are down-going bilaterally. DTRs are symmetrical throughout. Sensory, motor and coordinative functions appear intact. Skin:  Intact without suspicious lesions or rashes Cervical Nodes:  No lymphadenopathy noted Psych:  Cognition and judgment appear intact. Alert and cooperative with normal attention span and concentration. No apparent delusions, illusions, hallucinations   Impression & Recommendations:  Problem # 1:  DEGENERATIVE DISC DISEASE, LUMBOSACRAL SPINE (ICD-722.52) Following with Neurosurgeon at [facility] for this, had surgery  on 10/19. No change in meds will provide him with a letter simply stating his medical conditions as diagnosed by his PMD  Problem # 2:  SCIATICA (ICD-724.3)  His updated medication list for this problem includes:    Adult Aspirin Low Strength 81 Mg Tbdp (Aspirin)    Diclofenac Sodium Ricardo Mg Tbec (Diclofenac sodium) .[name]... 1  two times a day for inflammation  Problem # 3:  DIABETES MELLITUS, TYPE II (ICD-250.00)  His updated medication list for this problem includes:    Adult Aspirin Low Strength 81 Mg Tbdp (Aspirin)    Lisinopril 20 Mg Tabs (Lisinopril) .[name]... 1/2 tablet by mouth once daily    Glipizide 10 Mg Tb24 (Glipizide) .[name]... 1 by mouth once daily    Metformin Hcl 1000 Mg Tabs (Metformin hcl) .[name]... 1 tab by mouth two times a day and 1/2 tabs by mouth q noon Avoid simple carbs  Complete Medication List: 1)  Crestor 10 Mg Tabs (Rosuvastatin calcium) .... Take 1 tablet by mouth once a day 2)  Adult Aspirin Low Strength 81 Mg Tbdp (Aspirin) 3)  Onetouch Ultra Test Strp (Glucose blood) .... Test as directed 4)  Lisinopril 20 Mg Tabs (Lisinopril) .... 1/2 tablet by mouth once daily 5)  Glipizide 10 Mg Tb24 (Glipizide) .[name].. 1 by mouth once daily 6)  Acyclovir 400 Mg Tabs (Acyclovir) .... Once daily 7)  Diclofenac Sodium Ricardo Mg Tbec (Diclofenac sodium) .[name].. 1  two times a day for inflammation 8)  Metformin Hcl 1000 Mg Tabs (Metformin hcl) .[name].. 1 tab by mouth two times a day and 1/2 tabs by mouth q noon 9)  Synthroid 100 Mcg Tabs (Levothyroxine sodium) .[name].. 1 qd  Patient Instructions: 1)  Please schedule a follow-up appointment in 2 months.  2)  BMP prior to visit, ICD-9: 250.01 3)  Hepatic Panel prior to visit ICD-9: 401.1 4)  Lipid panel prior to visit ICD-9 : 272.4 5)  TSH prior to visit ICD-9 : 401.1 6)  CBC w/ Diff prior to visit ICD-9 : 401.1 7)  HgBA1c prior to visit  ICD-9: 250.01 8)  Urine Microalbumin prior to visit ICD-9 : 250.01 Prescriptions: METFORMIN HCL 1000 MG TABS  (METFORMIN HCL) 1 tab by mouth two times a day and 1/2 tabs by mouth q noon  #Ricardo x 3   Entered and Authorized by:   Danise Edge MD   Signed by:   Danise Edge MD on 12/21/2009   Method used:   Electronically to        [facility]  [phone]* (retail)       [address] [place], [place]  [address]       Ph: [phone] or [phone]       Fax: [phone]   RxID:   [id number]    Orders Added: 1)  Est. Patient  Level IV [id number]

## 2010-03-30 NOTE — Letter (Signed)
Summary: [facility]  [facility]   Imported By: Maryln Gottron 02/21/2010 14:39:23  _____________________________________________________________________  External Attachment:    Type:   Image     Comment:   External Document

## 2010-03-30 NOTE — Progress Notes (Signed)
Summary: Pt is needing to get a letter written by Dr. Scotty Court asap  Phone Note Call from Patient Call back at Home Phone [phone]   Caller: Patient Summary of Call: Pt called and said that he is still needing a letter written by Dr. Scotty Court re: dissability. Pt says that he has to get this letter done asap or he will lose job and everything. Pt found old disability req letter from 10/11/2004 office [phone], that was written by Dr. Scotty Court. The letter needs to have all dxs, injuries, disease, treatments, that he has seen Dr. Scotty Court for. He needs a narrative re: all these issues and the most important thing is time frame. His job is giving him option to retire, or he will lose his job.   Initial call taken by: Lucy Antigua,  December 20, 2009 12:02 PM  Follow-up for Phone Call        Spoke with patient and advised per Dr. Abner Greenspan she is more than happy to see him however she will not be able to provide the information that he is requesting and he may therefore be referred to a disability clinic. Or he can wait to see Dr. Scotty Court upon his return to the office in Dec. Patient states he understands however he still wants to come and to dicuss with Dr. Abner Greenspan since he can not wait to Dec to handle this. Follow-up by: Trixie Dredge,  December 20, 2009 2:57 PM

## 2010-03-30 NOTE — Progress Notes (Signed)
Summary: requesting labs  Phone Note Call from Patient Call back at Home Phone [phone]   Caller: Patient====live call Summary of Call: pt's next appt is in February. However, he would like to have an A1C labs ordered within the next 2 weeks? Is this ok? Initial call taken by: Warnell Forester,  February 14, 2010 9:23 AM  Follow-up for Phone Call        ok per Dr. Scotty Court appt scheduled 04/11/10 Follow-up by: Trixie Dredge,  February 16, 2010 6:16 PM

## 2010-03-30 NOTE — Progress Notes (Signed)
Summary: REFILL REQUEST  Phone Note Refill Request Message from:  Patient on January 27, 2010 10:23 AM  Refills Requested: Medication #1:  ONETOUCH ULTRA TEST   STRP test as directed   Notes: [facility]    Initial call taken by: Debbra Riding,  January 27, 2010 10:23 AM    Prescriptions: [place] TEST   STRP (GLUCOSE BLOOD) test as directed  #100 Each x 1   Entered by:   Jeremy Johann CMA   Authorized by:   Judithann Sheen MD   Signed by:   Jeremy Johann CMA on 01/30/2010   Method used:   Faxed to ...       [facility]  [phone]* (retail)       [address] [place], [place]  [address]       Ph: [phone] or [phone]       Fax: [phone]   RxID:   [id number]

## 2010-03-30 NOTE — Progress Notes (Signed)
  Phone Note Call from Patient   Caller: Spouse Summary of Call: husb wants name of pain management doctor . Initial call taken by: Pura Spice, RN,  September 29, 2009 10:13 AM  Follow-up for Phone Call        called [place] [facility] Follow-up by: Judithann Sheen MD,  October 06, 2009 6:51 PM

## 2010-03-30 NOTE — Assessment & Plan Note (Signed)
Summary: cpx//ccm [facility] APPT TIME/NJR   Vital Signs:  Patient profile:   60 year old male Height:      66 inches Weight:      179 pounds BMI:     29.00 O2 Sat:      94 % Temp:     97.9 degrees F Pulse rate:   54 / minute Pulse rhythm:   regular BP sitting:   140 / 80  (left arm)  Vitals Entered By: Pura Spice, RN (September 22, 2009 10:47 AM) CC: cpx  Is Patient Diabetic? Yes Did you bring your meter with you today? 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 He is seeing Dr. Glenetta Hew at [facility] recommended fusion of the lumbar spine since she had previous surgery referred him to the doctor saw her for second opinion who recommends trying water therapy for one year or 2 continual distal Patient relates he has a return of bilateral epicondylitis on the previous postal injury Continues to have knee pain from the past He continues to be concerned about the return of herpes of which he is on daily treatment also possibility of HPV  Concerned about small lesions are labeled palpated on his lip and one lesion on his chest\par States he occasionally has slight dysuria at the tip of his pain a 10 is concerned about a herpetic lesion Otherwise patient is doing fine except for his diabetes which he relates CBGs 150-180 and he has not been watching his diet nor taking his medication as he should  Allergies (verified): No Known Drug Allergies  Past History:  Social History: Last updated: 11/25/2006 Occupation: Married Never Smoked Alcohol use-yes Drug use-no Regular exercise-no  Risk Factors: Smoking Status: never (11/25/2006)  Past Medical History: High Cholesterol Hypertension Depression Fatigue degenerative disc disease lumbar spine Diabetes mellitus type 2 B. simplex 2 Bilateral epicondylitis  Past Surgical History: Colonoscopy herniated lumbar disc  Review of Systems  See HPI  The patient denies anorexia, fever, weight loss, weight gain, vision loss, decreased hearing, hoarseness, chest pain, syncope, dyspnea on exertion, peripheral edema, prolonged cough, headaches, hemoptysis, abdominal pain, melena, hematochezia, severe indigestion/heartburn, hematuria, incontinence, genital sores, muscle weakness, suspicious skin lesions, transient blindness, difficulty walking, depression, unusual weight change, abnormal bleeding, enlarged lymph nodes, angioedema, breast masses, and testicular masses.    Physical Exam  General:  Well-developed,well-nourished,in no acute distress; alert,appropriate and cooperative throughout examination Head:  Normocephalic and atraumatic without obvious abnormalities. No apparent alopecia or balding. Eyes:  No corneal or conjunctival inflammation noted. EOMI. Perrla. Funduscopic exam benign, without hemorrhages, exudates or papilledema. Vision grossly normal. Ears:  External ear exam shows no significant lesions or deformities.  Otoscopic examination reveals clear canals, tympanic membranes are intact bilaterally without bulging, retraction, inflammation or discharge. Hearing is grossly normal bilaterally. Nose:  External nasal examination shows no deformity or inflammation. Nasal mucosa are pink and moist without lesions or exudates. Mouth:  Oral mucosa and oropharynx without lesions or exudates.  Teeth in good repair. Neck:  No deformities, masses, or tenderness noted. Chest Wall:  No deformities, masses, tenderness or gynecomastia noted. Breasts:  No masses or gynecomastia noted Lungs:  Normal respiratory effort, chest expands symmetrically. Lungs are clear to auscultation, no crackles or wheezes. Heart:  Normal rate and regular rhythm. S1 and S2 normal without gallop, murmur, click, rub or other extra sounds. Abdomen:  Bowel sounds positive,abdomen  soft and non-tender without masses, organomegaly or hernias noted. Rectal:  No external  abnormalities noted. Normal sphincter tone. No rectal masses or tenderness. Genitalia:  Testes bilaterally descended without nodularity, tenderness or masses. No scrotal masses or lesions. No penis lesions or urethral discharge. Prostate:  Prostate gland firm and smooth, no enlargement, nodularity, tenderness, mass, asymmetry or induration. Msk:  No deformity or scoliosis noted of thoracic or lumbar spine.   Pulses:  R and L carotid,radial,femoral,dorsalis pedis and posterior tibial pulses are full and equal bilaterally Extremities:  No clubbing, cyanosis, edema, or deformity noted with normal full range of motion of all joints.   Neurologic:  No cranial nerve deficits noted. Station and gait are normal. Plantar reflexes are down-going bilaterally. DTRs are symmetrical throughout. Sensory, motor and coordinative functions appear intact. Skin:  Intact without suspicious lesions or rashes Cervical Nodes:  No lymphadenopathy noted Axillary Nodes:  No palpable lymphadenopathy Inguinal Nodes:  No significant adenopathy Psych:  Cognition and judgment appear intact. Alert and cooperative with normal attention span and concentration. 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 1 tab qd His updated medication list for this problem includes:    Synthroid 100 Mcg Tabs (Levothyroxine sodium) .[name]... 1 qd  Problem # 5:  LATERAL EPICONDYLITIS, BILATERAL (ICD-726.32) Assessment: Improved  Problem # 6:  DIABETES MELLITUS, TYPE II (ICD-250.00) Assessment: Deteriorated  His updated  medication list for this problem includes:    Adult Aspirin Low Strength 81 Mg Tbdp (Aspirin)    Lisinopril 20 Mg Tabs (Lisinopril) .[name]... 1/2 tablet by mouth once daily    Glipizide 10 Mg Tb24 (Glipizide) .[name]... 1 by mouth once daily    Metformin Hcl 1000 Mg Tabs (Metformin hcl) .[name]... 1 bid  Problem # 7:  DEGENERATIVE DISC DISEASE, LUMBOSACRAL SPINE (ICD-722.52) Assessment: Unchanged on the care neurosurgeon at present water therapy  Problem # 8:  HYPERLIPIDEMIA (ICD-272.4) Assessment: Improved  His updated medication list for this problem includes:    Crestor 10 Mg Tabs (Rosuvastatin calcium) .[name]... Take 1 tablet by mouth once a day  Complete Medication List: 1)  Crestor 10 Mg Tabs (Rosuvastatin calcium) .... Take 1 tablet by mouth once a day 2)  Adult Aspirin Low Strength 81 Mg Tbdp (Aspirin) 3)  Onetouch Ultra Test Strp (Glucose blood) .... Test as directed 4)  Lisinopril 20 Mg Tabs (Lisinopril) .... 1/2 tablet by mouth once daily 5)  Glipizide 10 Mg Tb24 (Glipizide) .[name].. 1 by mouth once daily 6)  Acyclovir 400 Mg Tabs (Acyclovir) .... Once daily 7)  Diclofenac Sodium 75 Mg Tbec (Diclofenac sodium) .[name].. 1  two times a day for inflammation 8)  Metformin Hcl 1000 Mg Tabs (Metformin hcl) .[name].. 1 bid 9)  Synthroid 100 Mcg Tabs (Levothyroxine sodium) .[name].. 1 qd  Other Orders: EKG w/ Interpretation (93000)  Patient Instructions: 1)  To return in 3 months for HgbA1C and TSH 2)  increase Synthroid to 100 marker around the study 3)  Continue under the care of a neurosurgeon 4)  Refilled medications Prescriptions: METFORMIN HCL 1000 MG TABS (METFORMIN HCL) 1 bid  #180 x 3   Entered and Authorized by:  Judithann Sheen MD   Signed by:   Judithann Sheen MD on 09/22/2009   Method used:   Electronically to        [facility]  [phone]* (retail)       [address] [place], [place]  [address]       Ph: [phone] or [phone]       Fax: [phone]   RxID:    [id number] ACYCLOVIR 400 MG TABS (ACYCLOVIR) once daily  #90 x 3   Entered and Authorized by:   Judithann Sheen MD   Signed by:   Judithann Sheen MD on 09/22/2009   Method used:   Electronically to        [facility]  [phone]* (retail)       [address] [place], [place]  [address]       Ph: [phone] or [phone]       Fax: [phone]   RxID:   [id number] GLIPIZIDE 10 MG  TB24 (GLIPIZIDE) 1 by mouth once daily  #90 x 3   Entered and Authorized by:   Judithann Sheen MD   Signed by:   Judithann Sheen MD on 09/22/2009   Method used:   Electronically to        [facility]  [phone]* (retail)       [address] [place], [place]  [address]       Ph: [phone] or [phone]       Fax: [phone]   RxID:   [id number] LISINOPRIL 20 MG  TABS (LISINOPRIL) 1/2 tablet by mouth once daily  #135 x 3   Entered and Authorized by:   Judithann Sheen MD   Signed by:   Judithann Sheen MD on 09/22/2009   Method used:   Electronically to        [facility]  [phone]* (retail)       [address] [place], [place]  [address]       Ph: [phone] or [phone]       Fax: [phone]   RxID:   [id number] SYNTHROID 100 MCG TABS (LEVOTHYROXINE SODIUM) 1 qd  #90 x 3   Entered and Authorized by:   Judithann Sheen MD   Signed by:   Judithann Sheen MD on 09/22/2009   Method used:   Electronically to        [facility]  [phone]* (retail)       [address] [place], [place]  [address]       Ph: [phone] or [phone]       Fax: [phone]   RxID:   [id number]   Appended Document: [email or web address]//ccm [facility] APPT TIME/NJR electrocardiogram was completely normal with sinus bradycardia

## 2010-04-04 ENCOUNTER — Other Ambulatory Visit (INDEPENDENT_AMBULATORY_CARE_PROVIDER_SITE_OTHER): Payer: PRIVATE HEALTH INSURANCE | Admitting: Family Medicine

## 2010-04-04 DIAGNOSIS — E119 Type 2 diabetes mellitus without complications: Secondary | ICD-10-CM

## 2010-04-05 NOTE — Progress Notes (Signed)
Summary: REQUEST FOR LETTER  Phone Note Call from Patient Call back at Home Phone [phone]   Caller: Patient Summary of Call: Pt wants to know status of letter about the highs and lows (sugar spikes) for his diabetic condition.... Advised that he needs this for work.... Pt  can be reached at [phone].  Initial call taken by: Debbra Riding,  March 23, 2010 2:20 PM  Follow-up for Phone Call        Pt called back again and needs to get this letter for his work, for this coming week. Pls call. Pt wants work in ov with Dr Scotty Court on Tues 03/28/10.   Follow-up by: Lucy Antigua,  March 24, 2010 9:06 AM

## 2010-04-05 NOTE — Letter (Signed)
Summary: Generic Letter  [place] [facility]  [address] [place], [place] [address]   Phone: [phone]  Fax: [phone]    03/30/2010  JOSEALBERTO MONTALTO [address] [place], [place]  [address]  Dear Mr. Hagger,  I will attempt to explain the effect of the changing blood sugar levels in a diabetic patient. When the blood sugar is elevated or higher than it should be he does have an effect on the patient as far as clear thinking as well as at times dizziness or confusion  Also when the patient has hypo-glycemic low blood sugar levels he can become mild follow tall and also have problems with clear mental functions. If the blood sugar is low he will also contact you with her physical activities.  After years of poorly controlled diabetes he will have minimal changes which affects your cognitive abilities and function pertaining to your activities  I hope this will help to explain or give you the information that you desire  Sincerely  W.R. Scotty Court Junior M.D.FAAFP          Sincerely,   Harvie Heck MD

## 2010-04-11 ENCOUNTER — Other Ambulatory Visit: Payer: Self-pay

## 2010-04-13 ENCOUNTER — Ambulatory Visit (INDEPENDENT_AMBULATORY_CARE_PROVIDER_SITE_OTHER): Payer: PRIVATE HEALTH INSURANCE | Admitting: Family Medicine

## 2010-04-13 VITALS — BP 120/80 | HR 78 | Wt 182.0 lb

## 2010-04-13 DIAGNOSIS — E119 Type 2 diabetes mellitus without complications: Secondary | ICD-10-CM

## 2010-04-13 DIAGNOSIS — M549 Dorsalgia, unspecified: Secondary | ICD-10-CM

## 2010-04-13 DIAGNOSIS — E785 Hyperlipidemia, unspecified: Secondary | ICD-10-CM

## 2010-04-13 DIAGNOSIS — G8929 Other chronic pain: Secondary | ICD-10-CM

## 2010-04-13 MED ORDER — ATORVASTATIN CALCIUM 20 MG PO TABS: 20.0000 mg | ORAL_TABLET | Freq: Every day | ORAL | Status: DC

## 2010-04-13 NOTE — Patient Instructions (Signed)
HgbA1c is 9.6, continue metformin 1000mg  twice daily, increase glypizide 10mg  twice daily You need very much so to decrease carbs, increase exercise as much as you can with your back problems. To  Change crestor to lipitor 20mg  each day Schedule HgbA1c and Lipid profile, hepatic profile in 3 months

## 2010-04-17 NOTE — Progress Notes (Signed)
  Subjective:    Patient ID: Ricardo [name], male    DOB: 02/10/1951, 60 y.o.   MRN: [id number] This 60 year old white male he is in today to discuss his hemoglobin A1c for which he knows his diabetes is poorly controlled with a hemoglobin A1c of 9.5. He is not following a diet and has not taken his medication as prescribed and we discussed at some length the importance of both of these factorsHis only new complaint is that he have occasional headache. This is related to stress which is recently lost his job and the reason we discussed at lengthHe desires to change Crestor to Lipitor for financial reasons and will start him on Lipitor 20 mg each day  [facility] problemH[facility]    Review of Systemssee H&P     Objective:   Physical Exam  Constitutional: He is oriented to person, place, and time.  HENT:  Head: Normocephalic and atraumatic.  Left Ear: External ear normal.  Nose: Nose normal.  Cardiovascular: Normal rate, regular rhythm and intact distal pulses.  Exam reveals no gallop and no friction rub.   Murmur heard. Musculoskeletal: He exhibits tenderness. He exhibits no edema.       Examination of the lumbar spine revealed limitation and pain on extreme flexion. No neurological findings  Neurological: He is alert and oriented to person, place, and time. He has normal reflexes. He displays normal reflexes. A cranial nerve deficit is present. He exhibits normal muscle tone. Coordination normal.  Psychiatric: He has a normal mood and affect. His behavior is normal. Judgment and thought content normal.          Assessment & Plan:  Diabetes mellitus is poorly controlled with hemoglobin A1c of 9.5 here stressed following the diet decrease carbohydrates and increase exercise here at back certainly take his medications as prescribed. Change Crestor or Lipitor so repeat his but did family in 3 months as well as his hemoglobin A1c, also hepatic  profile

## 2010-04-18 ENCOUNTER — Ambulatory Visit: Payer: Self-pay | Admitting: Family Medicine

## 2010-04-22 IMAGING — CR DG HIP COMPLETE 2+V*R*
3 series · 3 of 3 positions shown · non-contrast
Comparison: None

CLINICAL DATA: Chronic right hip pain.

RIGHT HIP - COMPLETE 2+ VIEW

[view not recorded (1 of 3)]
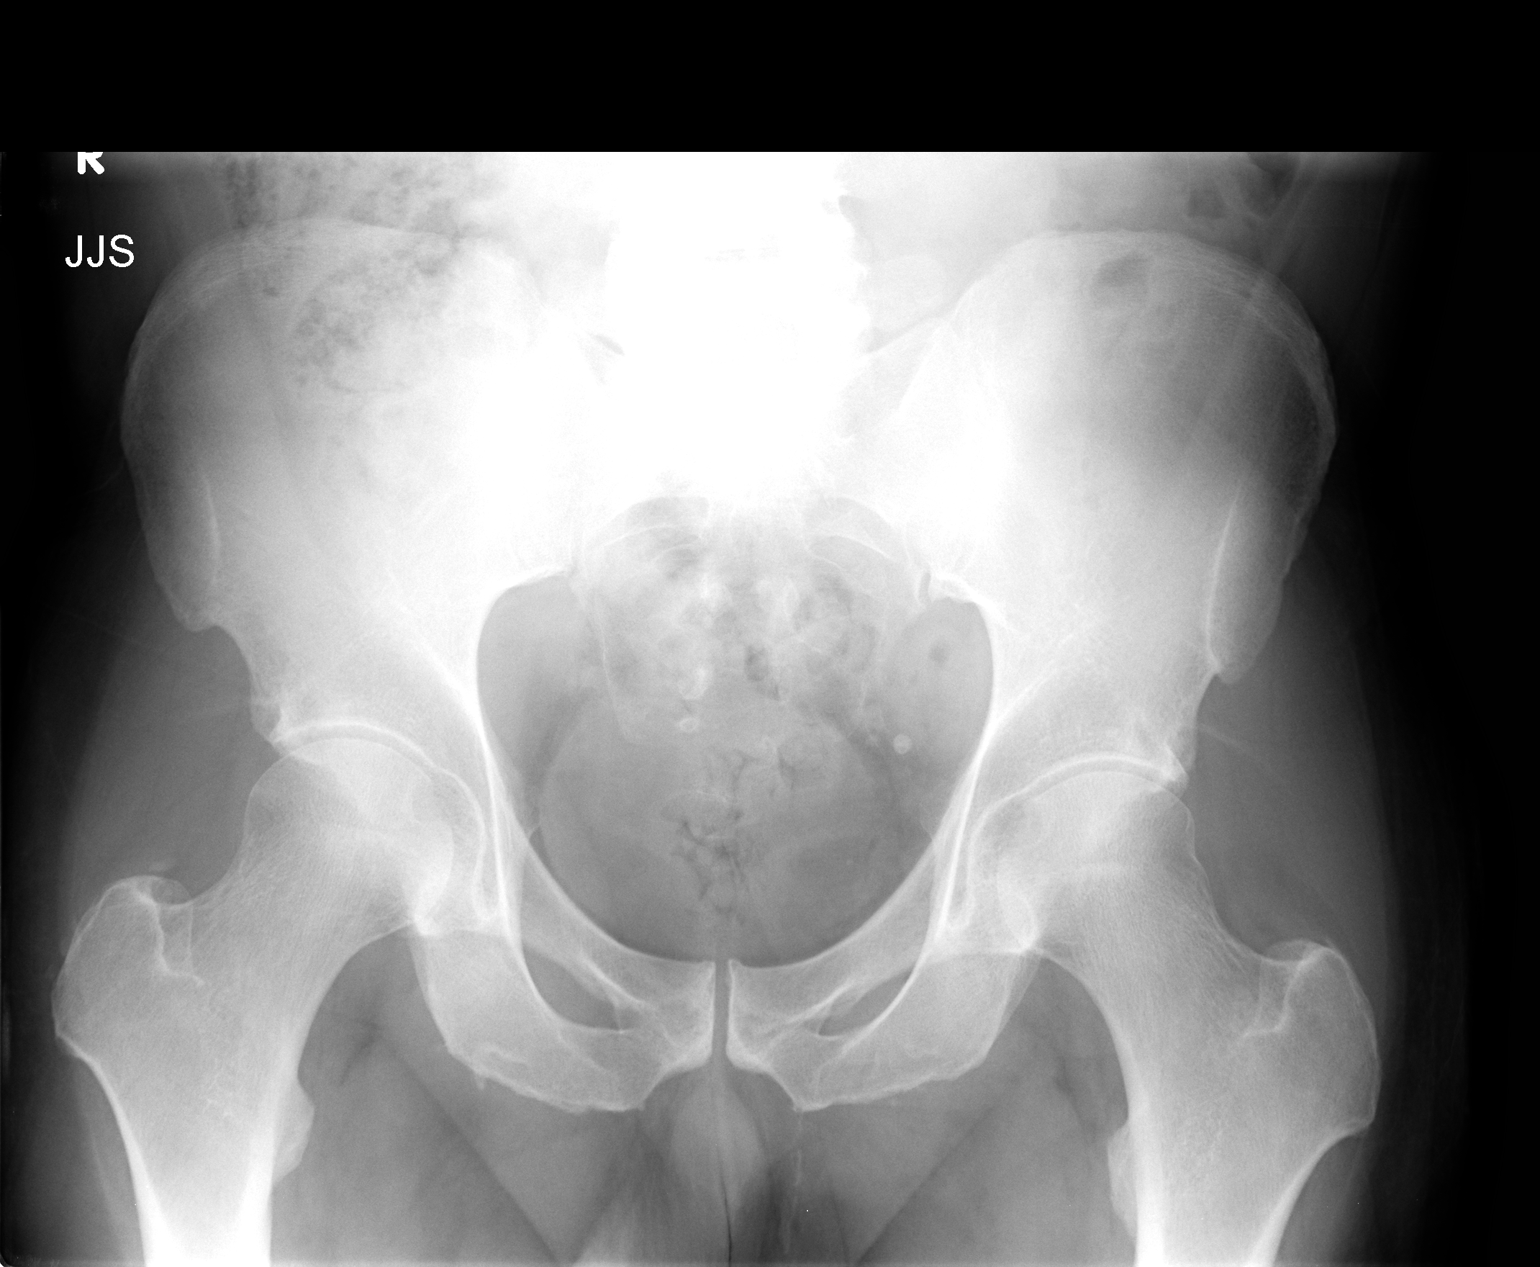

[view not recorded (2 of 3)]
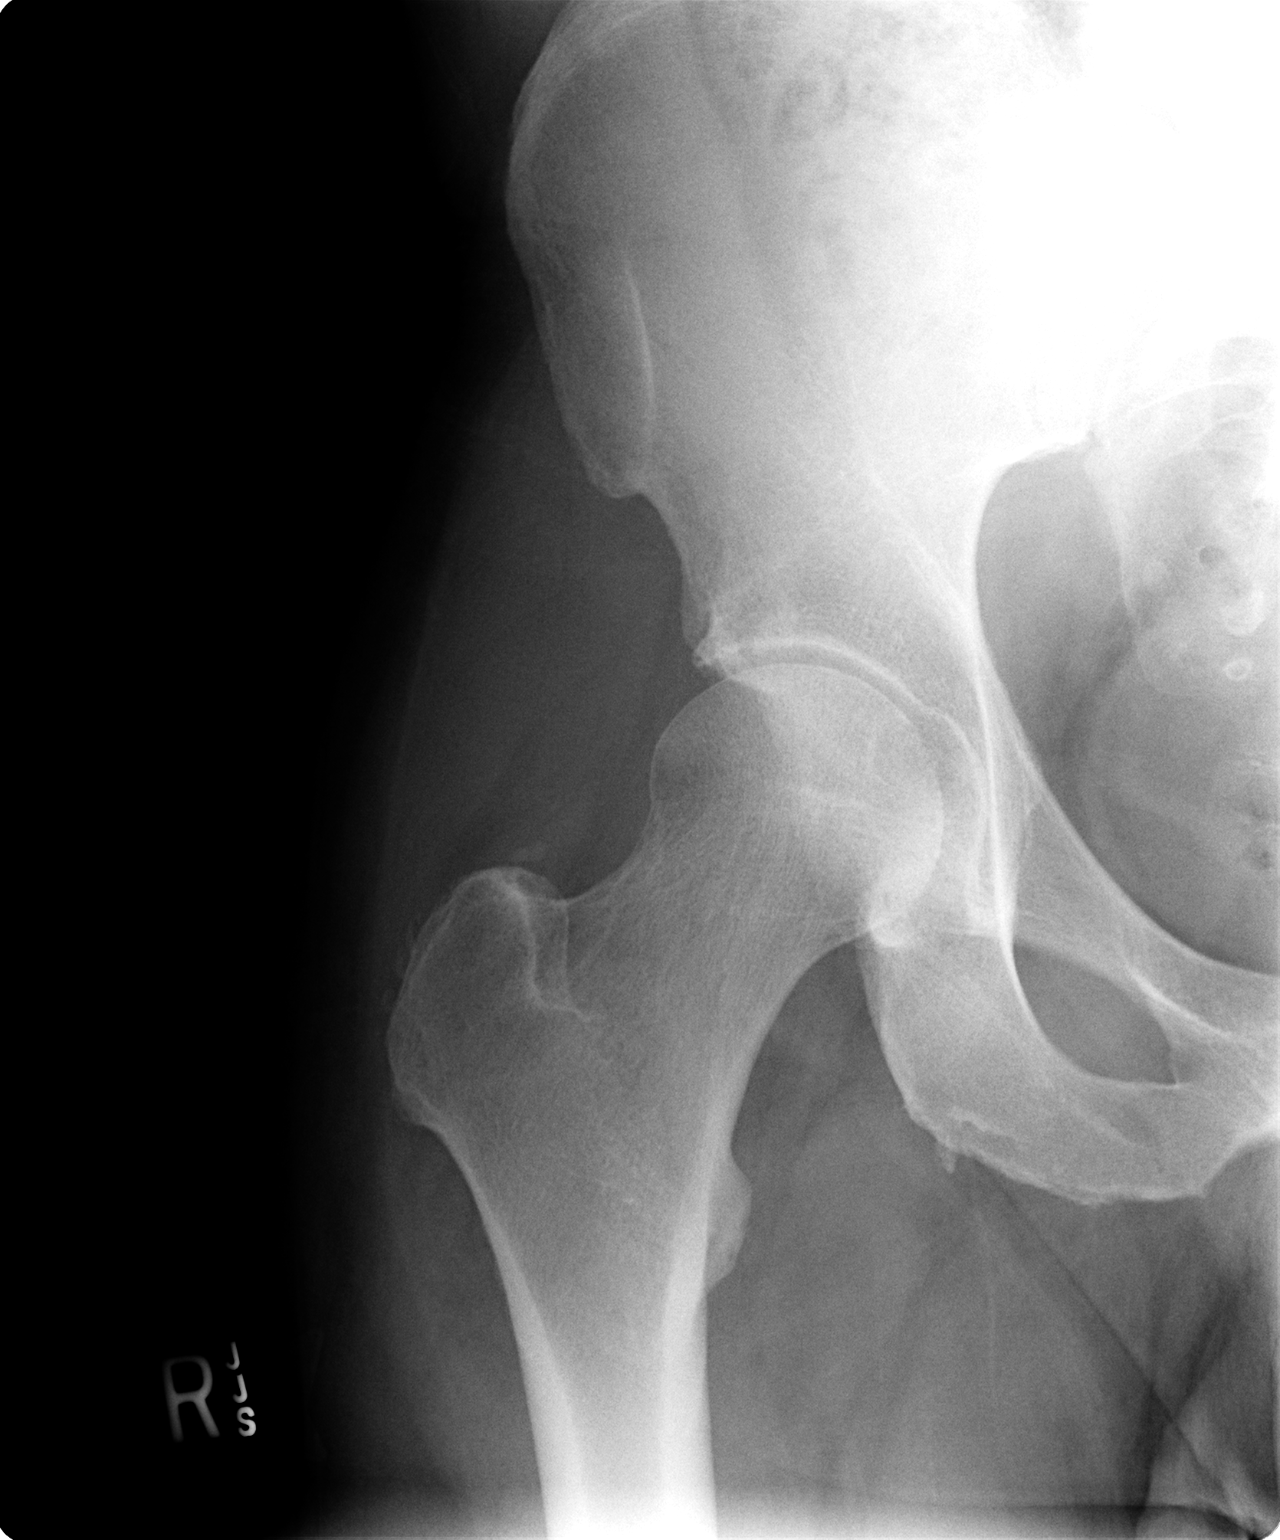

[view not recorded (3 of 3)]
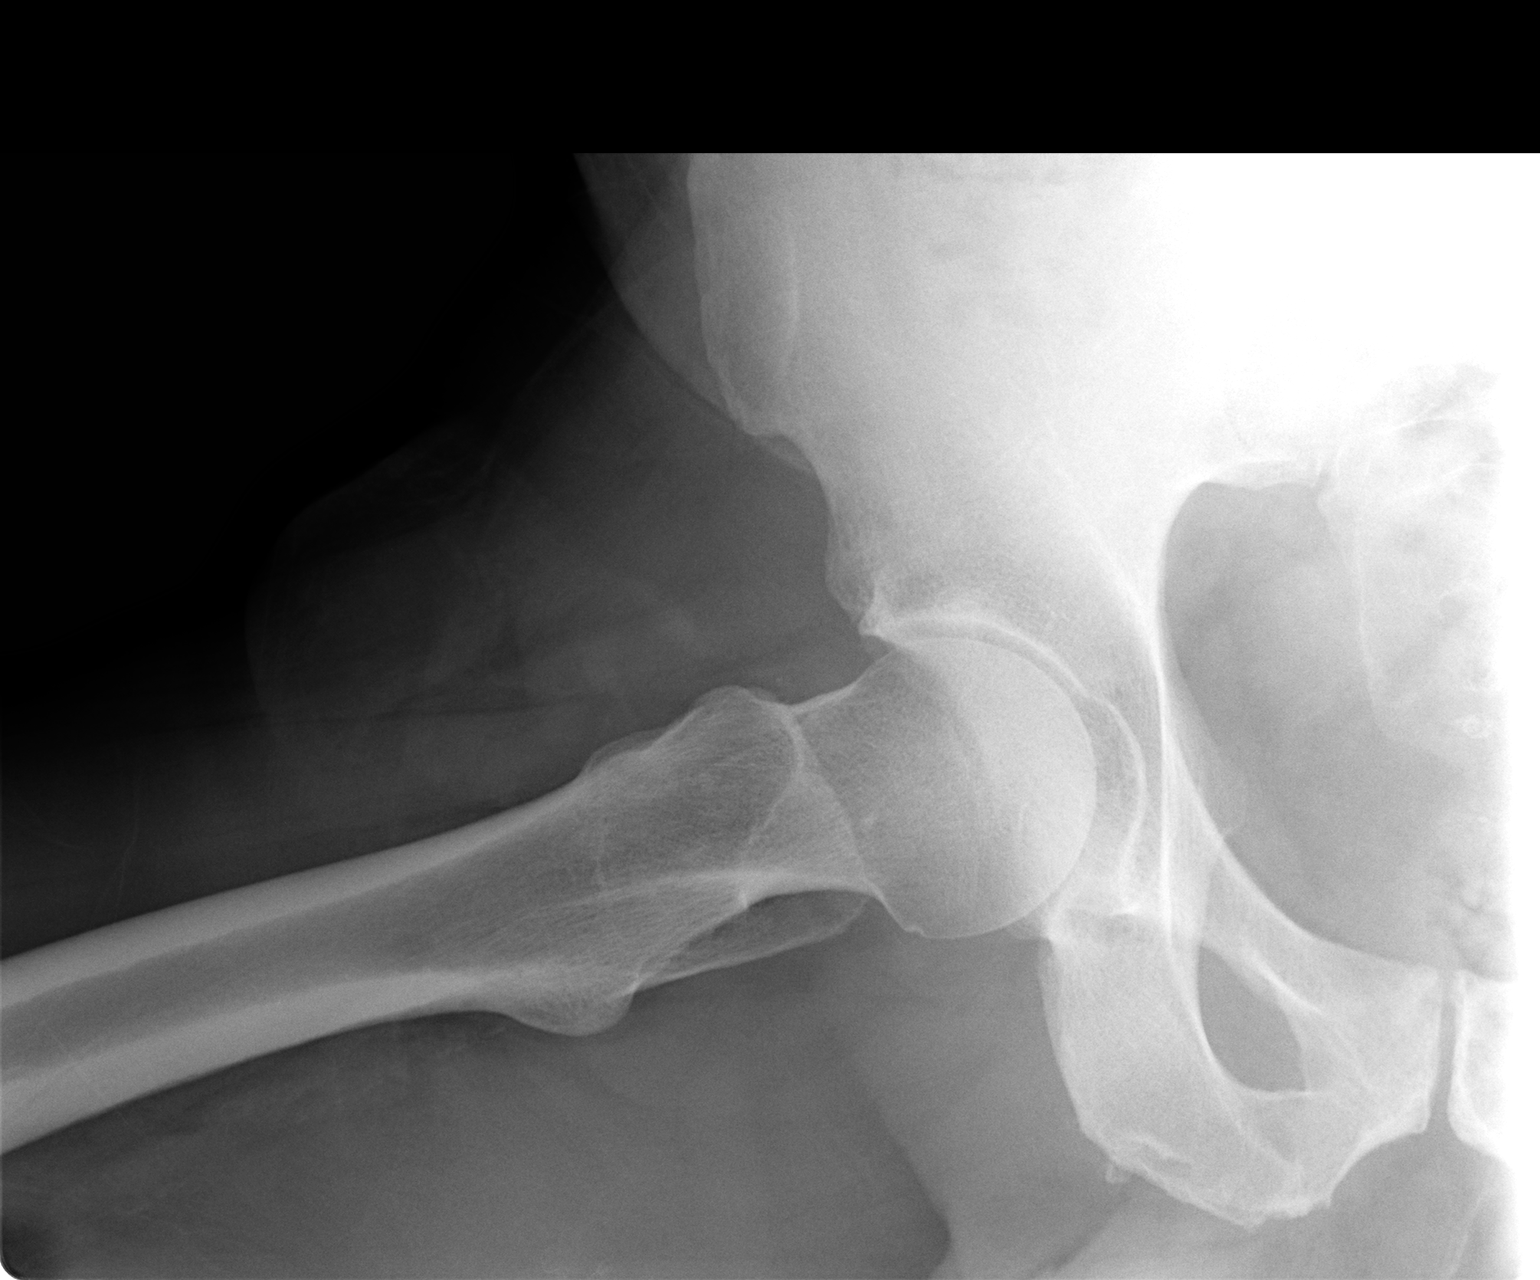

[3 of 3 positions shown; findings below may reference images not displayed]

FINDINGS: Degenerative changes lower lumbar spine.  SI joints are
symmetric and unremarkable.

Mild degenerative changes right hip.  Normal mineralization within
the proximal femur.  Degenerative changes of the pubic symphysis.
IMPRESSION: Degenerative changes as described.

## 2010-04-26 NOTE — Progress Notes (Signed)
Discussed with patient regarding better diet control

## 2010-06-13 ENCOUNTER — Other Ambulatory Visit: Payer: Self-pay

## 2010-06-13 MED ORDER — GLIPIZIDE ER 10 MG PO TB24: 10.0000 mg | ORAL_TABLET | Freq: Every day | ORAL | Status: DC

## 2010-06-16 ENCOUNTER — Other Ambulatory Visit: Payer: Self-pay

## 2010-06-16 NOTE — Telephone Encounter (Signed)
[facility] gl[facility]ide 10 mg 24 hr tab with sig:  Take 1 tablet by mouth bid  With 60 and 11 refills

## 2010-07-11 NOTE — Discharge Summary (Signed)
NAMEJAHLON, Ricardo [name]            ACCOUNT NO.:  [id number]   MEDICAL RECORD NO.:  [id number]          PATIENT TYPE:  OBV   LOCATION:  6524                         FACILITY:  [facility]   PHYSICIAN:  Valerie A. Felicity Coyer, MDDATE OF BIRTH:  Nov 21, 1950   DATE OF ADMISSION:  03/31/2007  DATE OF DISCHARGE:  04/03/2007                               DISCHARGE SUMMARY   DISCHARGE DIAGNOSES:  1. Atypical chest pain.  2. Type 2 diabetes.  3. Hyperlipidemia.   HISTORY OF PRESENT ILLNESS:  Ricardo [name] is a 60 year old white male  with no prior history of cardiac disease but with history of diabetes  who presented to the emergency room on day of admission with onset of  chest pain x2 days ago that was intermittent in nature.  The patient on  the day of admission went to work and after ambulating up stairs, the  chest pain increased to an 8 out of 10.  It was described as pressure  with shortness of breath.  He denied any nausea, vomiting, or  diaphoresis.  At this time the patient also developed left upper  extremity numbness and weakness.  He called EMS at that time for  transport to the ER.  En route to the ER the patient received sublingual  nitroglycerin as well as morphine.  However, he is not sure if this  helped his pain.  Workup in the emergency room revealed a normal sinus rhythm on EKG with  no acute changes, no signs of ischemia.  Cardiac enzymes were negative  x1.  Due to the patient's multiple risk factors, he was admitted to the  hospital for further evaluation and treatment.   PAST MEDICAL HISTORY:  1. Type 2 diabetes.  2. Hyperlipidemia.  3. Hypertension.  4. Multiple orthopedic surgeries.   HOSPITAL COURSE:  1. Atypical chest pain.  The patient was admitted to a telemetry floor      to monitor for any arrhythmias.  Serial cardiac enzymes were      negative x3.  Followup EKG again was normal sinus rhythm with no      signs of ischemia.  The patient with continued mild to  moderate mid      sternal chest pain on day after admission at which time cardiology      was asked to come evaluate the patient.  The initial plan was to      perform a cardiac catheterization, however, on arriving at the cath      lab the patient did not feel like he really needed this test and      after the procedure was explained to him by the cardiologist, he      preferred to defer the procedure for now.  The patient underwent      Myoview stress test on the day of discharge which revealed an      ejection fraction of 65% and was negative for any reversible      ischemia or infarction.  After a full cardiac workup, it was felt      the patient's pain and associated symptoms were unrelated to  any      cardiac disease.  The patient was discharged home to follow up with      primary care physician.  2. Type 2 diabetes.  The patient was continued on home medication      regimen during the hospitalization.  3. Hyperlipidemia.  A Statin was continued during hospitalization.   DISCHARGE MEDICATIONS:  1. Glucotrol 5 mg p.o. b.i.d.  2. Lisinopril 10 mg p.o. daily.  3. Glucophage 500 mg p.o. b.i.d.  4. Crestor 10 mg p.o. daily.   DISCHARGE LABORATORY:  Hemoglobin 14.7, hematocrit 42.9, platelet count  226.  Sodium 140, potassium 3.9, BUN 11, creatinine 1.24.  Hemoglobin  A1c 6.9.  Again cardiac enzymes were negative x3.  Cholesterol 150, HDL  33, LDL 87, triglycerides 148.   DISPOSITION:  The patient was discharged home with followup with Dr.  Scotty Court in 1-2 weeks or as needed.   He was instructed to return to the ER if he develops any recurrent chest  pain or shortness of breath.      Cordelia Pen, NP      Raenette Rover. Felicity Coyer, MD  Electronically Signed    LE/MEDQ  D:  04/10/2007  T:  04/11/2007  Job:  [id number]   cc:   Ellin Saba., MD

## 2010-07-11 NOTE — Letter (Signed)
January 08, 2007    To Whom It May Concern   RE:  Ricardo [name], Ricardo [name]  MRN:  [id number]  /  DOB:  06/27/1950   Dear Londell Moh,   Ricardo [name] has been a patient under my care, and as you are well  aware, he has had multiple surgeries and medical problems and continues  to have painful joints as well as back and neck pain on any extreme  activity.   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  As a result of his  back injury and subsequent surgeries involving also his neck, he is left  with residual problems, as he is limited to work only five days  consecutively with two days off.  I would like to request that you would  permit him to have two days off on Saturday and Sunday, which would be  ideal for him, since family members are also off on these days.  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  Therefore, it would be much better if Ricardo [name]  could be able to be off on Saturday and Sunday as the two consecutive  days.  If anyone would like to discuss this with me, please do not  hesitate to call me.    Sincerely,      Ellin Saba., MD  Electronically Signed    WRS/MedQ  DD: 01/08/2007  DT: 01/09/2007  Job #: [id number]

## 2010-07-11 NOTE — H&P (Signed)
NAMEORIN, EBERWEIN            ACCOUNT NO.:  [id number]   MEDICAL RECORD NO.:  [id number]          PATIENT TYPE:  OBV   LOCATION:  6524                         FACILITY:  [facility]   PHYSICIAN:  Rosalyn Gess. Norins, MD  DATE OF BIRTH:  10/19/1950   DATE OF ADMISSION:  03/31/2007  DATE OF DISCHARGE:                              HISTORY & PHYSICAL   This is 24 eval admission.   TIME OF ADMISSION:  1900 hours.   CHIEF COMPLAINT:  Chest pain.   HISTORY OF PRESENT ILLNESS:  Mr. Harvie a 60 year old married white  male, followed by Dr. Cecille Rubin.  He has a history of diabetes.  No  prior history of heart disease.  He presents with a 1-day history of  chest pain that is described as a sharp stabbing pain in the substernal  region that was constant, but waxing and waning, now improved at time of  the examination.  Deep inspiration causes pain in his sub-sternum.  He  denies frank diaphoresis.  He has had no radiation of pain or discomfort  to his arm or jaw.  Cardiac risk factors positive for diabetes.  Positive for hyperlipidemia.  Positive for hypertension.  Negative for  tobacco abuse.  Negative for obesity, negative for family history.   With the patient's history of chest discomfort, with his moderate to  high risk for coronary artery disease, he is now admitted for a 24-hour  evaluation to cycle cardiac enzymes to rule out MI.   PAST MEDICAL HISTORY:  1. Hypertension.  2. Diabetes.  3. Hyperlipidemia.  4. IBS.  5. Low back pain.   SURGICAL HISTORY:  1. Left knee arthroscopy.  2. Left shoulder surgery.   CURRENT MEDICATIONS:  1. Lisinopril 10 mg daily.  2. Metformin 500 mg b.i.d.  3. Glipizide 5 mg b.i.d.  4. Crestor 10 mg daily.   FAMILY HISTORY:  Negative for cardiovascular disease.   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  He is in a stable relationship.   REVIEW OF SYSTEMS:  Is negative except for low back  pain and mild  constipation associated with his IBS.   PHYSICAL EXAM:  VITAL SIGNS:  Temperature 97.7, blood pressure 147/93,  heart rate was 80, respirations 20, O2 sats 98% on room air.  GENERAL APPEARANCE:  Well-nourished, well-developed Caucasian male in no  acute distress.  HEENT:  Exam normocephalic, atraumatic.  Oropharynx with native  dentition, with no lesions noted.  Posterior pharynx was clear.  Conjunctiva and sclerae was clear.  Pupils equal, round, reactive to  light accommodation.  Funduscopic exam revealed normal disk margins with  no vascular abnormalities.  NECK:  Supple without thyromegaly.  No lymphadenopathy was noted in the  cervical or supraclavicular regions.  CHEST:  No CVA tenderness.  LUNGS:  Clear with no rales, wheezes or rhonchi.  CARDIOVASCULAR:  2+ radial pulses JVD, no carotid bruit.  She had a  quiet precordium with a regular rate and rhythm without murmurs, rubs or  gallops.  ABDOMEN:  The patient has bowel sounds  in all four quadrants.  There is  no guarding or rebound.  No organo-splenomegaly was appreciated.  There  was no tenderness to deep palpation.  RECTAL:  Deferred to Dr. Scotty Court as an outpatient.  EXTREMITIES:  Without clubbing, cyanosis, edema or deformity.  NEUROLOGIC:  Exam was nonfocal.   LABORATORY:  A 12-lead electrocardiogram revealed normal sinus rhythm.  I did not appreciate any acute changes.   LABORATORY:  Point of care CK-MB was 1.1.  Troponin was less than 0.05,  sodium 137, potassium 4.2, chloride 102, CO2 29, BUN 15, creatinine 1.2,  glucose was 130.   ASSESSMENT/PLAN:  Cardiovascular:  Patient with moderate to high risk  for cardiovascular disease, presenting with atypical chest pain.  It is  non-exertional.  His initial cardiac enzyme is negative.   PLAN:  1. Twenty-four hour telemetry admission.  Will cycle cardiac enzymes      q.8 x3, troponin-I x2.  Follow-up EKG is ordered.  Will start the      patient on  atenolol 25 mg q.12, nitroglycerin sublingual on a      p.r.n. basis, enteric-coated aspirin 325 mg daily.  Will continue      the patient's home medications including lisinopril.  2. Diabetes.  The patient is well controlled at home by his report.      Plan:  Will get a hemoglobin A1c.  Will continue home medications      including glipizide 5 mg b.i.d., metformin 500 mg b.i.d. and will      follow sliding scale.  3. Lipid:  The patient is on Crestor.  Plan will be a fasting lipid      profile.  Will continue the patient's home medication of Crestor 10      mg daily.  4. GI: The patient does have a history of IBS.  Suspect he may have      some reflux symptoms causing his significant discomfort.  Plan:      Will start the patient on Protonix 40 mg p.o. q.a.m.   SUMMARY:  A pleasant patient with moderate-to-high risk for of the  obstructive coronary artery disease, who is now admitted 24-hour eval,  to rule out MI.   Thank you very much for your help.      Rosalyn Gess Norins, MD  Electronically Signed     MEN/MEDQ  D:  03/31/2007  T:  04/01/2007  Job:  [id number]   cc:   Ellin Saba., MD

## 2010-07-11 NOTE — Consult Note (Signed)
NAMEQUINTELL, BONNIN            ACCOUNT NO.:  [id number]   MEDICAL RECORD NO.:  [id number]          PATIENT TYPE:  OBV   LOCATION:  6524                         FACILITY:  [facility]   PHYSICIAN:  Thomas C. Wall, MD, FACCDATE OF BIRTH:  1950/05/04   DATE OF CONSULTATION:  04/01/2007  DATE OF DISCHARGE:                                 CONSULTATION   PRIMARY CARE PHYSICIAN:  Dr. Scotty Court.   CHIEF COMPLAINT:  Chest pain.   HISTORY OF PRESENT ILLNESS:  Mr. Gombos is a 60 year old male with no  history of coronary artery disease.  He went for a motorcycle ride two  days ago and returned from the ride with some back pain which was not  unusual for him, but also had some chest pain he described as pressure  and tightness.  His symptoms did not subside over the course of the  evening but he was able to sleep.  He was still having pain when he woke  up yesterday morning, but went to work as usual.  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  It was associated with  shortness of breath, but no nausea, vomiting or diaphoresis.  After  about an hour, he developed left upper extremity numbness and tingling  and also had some symptoms radiating up into his right year.  EMS was  called, and he received sublingual nitroglycerin, aspirin and morphine  there and also received sublingual nitroglycerin and morphine in the  emergency room.  His symptoms decreased to 3/10 but had been continuous  at that level since then.  His pain is worse with deep inspiration and  he feels as though it is worsened by lying on his left side.  He has  been treated with pain control medications which have maintained his  symptoms at a low level, but they have not resolved.   PAST MEDICAL HISTORY:  1. Diabetes.  2. Hypertension.  3. Hyperlipidemia.  4. Family history of coronary artery disease (not prematurely).  5. History of  irritable bowel syndrome.  6. History of low back pain.   SURGICAL HISTORY:  1. Status post left elbow surgery.  2. Right shoulder surgery.  3. Left knee surgery.  4. Back surgery.  5. Left Achilles surgery.   ALLERGIES:  No known drug allergies.   MEDICATIONS:  Prior to admission include:  1. Crestor 10 mg daily.  2. Lisinopril to mg a day.  3. Metformin 5 mg b.i.d.  4. Glipizide 5 mg daily.   CURRENT MEDICATIONS:  Include those medications with the glipizide  increased to 5 mg b.i.d. plus:  1. Aspirin 325 mg daily.  2. Atenolol 25 mg b.i.d.  3. Colace 200 mg daily.  4. Protonix 40 mg daily  5. Lovenox 80 mg subcu b.i.d.   SOCIAL HISTORY:  He lives in [place] with his wife and works at the  [employer] but is on light duty secondary to orthopedic issues.  He has no history of alcohol, tobacco or drug abuse,  but drinks several  cups of caffeinated coffee a day.   FAMILY HISTORY:  His mother died at age 43 with a history of heart  disease and his father died at age 65 with no history of coronary artery  disease.  He has three siblings, one of whom deceased in his 30s, but  none of them have a history of coronary artery disease.   REVIEW OF SYSTEMS:  He felt his heart beating margin but not near  regularly during the worst episode of chest pain.  The chest pain and  shortness of breath as described above.  He denies dyspnea on exertion,  orthopnea, PND or edema.  There has been no coughing, wheezing or  presyncope.  He denies issues with depression or anxiety.  He has  chronic arthralgias and pain.  He has been constipated for a while and  has tried fiber therapy for that with limited success and occasionally  has bright red blood per rectum secondary to this but not recently.  Full 14 point review of systems is otherwise negative.   PHYSICAL EXAMINATION:  VITAL SIGNS:  Temperature is 97.8, blood pressure  111/70, heart rate 82, respiratory rate 17, O2  saturation 94% on room  air.  GENERAL:  He is a well-developed, well-nourished white male in no acute  distress.  HEENT is normal.  NECK:  There is no lymphadenopathy, thyromegaly, bruit or JVD noted.  CV:  Heart is regular in rate and rhythm with an S1-S2 and no  significant murmur, rub or gallop is noted.  Distal pulses are intact in  all for extremities and no femoral bruits are appreciated.  LUNGS:  Essentially clear to auscultation bilaterally although he has a  few rales at bases.  SKIN:  No rashes or lesions are noted.  ABDOMEN:  Soft and nontender with active bowel sounds and no  hepatosplenomegaly by palpation.  EXTREMITIES:  There is no cyanosis, but clubbing or edema noted.  Musculoskeletal:  There is no joint deformity or effusions and no spine  or CVA tenderness.  It is noted although there is an area in his right  back between the spine and the scapular and she that is slightly tender  to palpation.  NEUROLOGICAL:  He is alert and oriented.  Cranial nerves II-XII grossly  intact.   STUDIES:  CT angiogram of the chest, abdomen and pelvis was negative for  PE.  There was nonobstructive plaque present in the left renal artery  and both common iliac arteries but no stenosis.  He had bibasilar  atelectasis-like scar which was unchanged from previous exams, but no  adenopathy or masses were seen.   EKG is sinus rhythm rate 69 with no acute ischemic changes.   LABORATORY VALUES:  Hemoglobin 13.6, hematocrit 40.3, WBCs 9.2,  platelets 191.  Sodium 138, potassium 3.8, chloride 100, CO2 29, BUN 13,  creatinine 1.08, glucose 176, hemoglobin A1c 6.9, CK-MB negative x3,  troponin I negative x2.  Total cholesterol 150, triglycerides 148, HDL  33 LDL 87.   IMPRESSION:  Mr. Wray is a 60 year old male with multiple cardiac  risk factors.  He has had ongoing pain for approximately 48 hours.  Although his EKG is without acute changes and his enzymes are negative.  His symptoms  worsened with exertion and included chest tightness that  radiated to his left arm with numbness and improved with sublingual  nitroglycerin and morphine.  Cath is therefore indicated and the risks  and benefits of  the procedure were discussed with the patient and his  wife.  He agrees to proceed.  He is currently on an aspirin, beta  blocker and ACE inhibitor and these will be continued.  The metformin is  already on hold because of the dye load he received yesterday, and we  will recheck the B-met in the morning as well as a CBC and coags.  Further evaluation and treatment will depend on the results of the  catheterization.      Theodore Demark, PA-C      Jesse Sans. Daleen Squibb, MD, [facility]  Electronically Signed    RB/MEDQ  D:  04/01/2007  T:  04/02/2007  Job:  [id number]   cc:   Ellin Saba., MD

## 2010-07-12 ENCOUNTER — Other Ambulatory Visit: Payer: Self-pay | Admitting: Family Medicine

## 2010-07-12 ENCOUNTER — Other Ambulatory Visit (INDEPENDENT_AMBULATORY_CARE_PROVIDER_SITE_OTHER): Payer: PRIVATE HEALTH INSURANCE | Admitting: Family Medicine

## 2010-07-12 DIAGNOSIS — E785 Hyperlipidemia, unspecified: Secondary | ICD-10-CM

## 2010-07-12 DIAGNOSIS — IMO0001 Reserved for inherently not codable concepts without codable children: Secondary | ICD-10-CM

## 2010-07-12 DIAGNOSIS — T887XXA Unspecified adverse effect of drug or medicament, initial encounter: Secondary | ICD-10-CM

## 2010-07-12 LAB — HEPATIC FUNCTION PANEL
ALT: 24 U/L (ref 0–53)
Albumin: 4 g/dL (ref 3.5–5.2)
Alkaline Phosphatase: 31 U/L — ABNORMAL LOW (ref 39–117)
Bilirubin, Direct: 0 mg/dL (ref 0.0–0.3)
Total Protein: 6.6 g/dL (ref 6.0–8.3)

## 2010-07-12 LAB — LIPID PANEL
Total CHOL/HDL Ratio: 4
Triglycerides: 165 mg/dL — ABNORMAL HIGH (ref 0.0–149.0)

## 2010-07-12 MED ORDER — PIOGLITAZONE HCL 30 MG PO TABS: 30.0000 mg | ORAL_TABLET | Freq: Every day | ORAL | Status: DC

## 2010-07-12 MED ORDER — GLIPIZIDE ER 10 MG PO TB24: 20.0000 mg | ORAL_TABLET | Freq: Every day | ORAL | Status: DC

## 2010-07-14 NOTE — Discharge Summary (Signed)
Ricardo [name], Ricardo [name]            ACCOUNT NO.:  [id number]   MEDICAL RECORD NO.:  [id number]          PATIENT TYPE:  INP   LOCATION:  5702                         FACILITY:  [facility]   PHYSICIAN:  Valerie A. Felicity Coyer, MDDATE OF BIRTH:  09-27-1950   DATE OF ADMISSION:  04/21/2006  DATE OF DISCHARGE:  04/26/2006                               DISCHARGE SUMMARY   .   DISCHARGE DIAGNOSES:  1. Acute otitis externa with cellulitis, culture positive for      methicillin-sensitive staphylococcus aureus.  2. Mild thrombocytopenia.  3. Diabetes type 2, poorly controlled.  4. Dyslipidemia.  5. Hypertension.   HISTORY OF PRESENT ILLNESS:  Ricardo [name] is a 60 year old male who was  admitted on April 21, 2006 with chief complaint of right ear  swelling.  He noted a 24- to 48-hour history of right ear pain and  swelling which was extending to his parotid glans.  He presented to the  emergency room due to pain.  The patient was admitted for IV  antibiotics.   PAST MEDICAL HISTORY:  1. Hypertension.  2. Diabetes type 2.  3. Hyperlipidemia.  4. Status post recent left knee arthroscopy.  5. History of shoulder surgery.   COURSE OF HOSPITALIZATION.:  1. Acute otitis externa.  The patient was admitted and was initially      placed on IV vancomycin and Ceptaz.  He was incidentally noted to      have vesicular pustules developing anterior to his ear and near ear      canal.  As a result of these findings, he was placed on acyclovir      for several days.  At the time of this dictation, herpes simplex      virus culture is negative.  Culture for herpes zoster is currently      pending.  Will need follow-up at his appointment with Dr. Scotty Court.      In addition, he did have a wound culture sent which noted rare      staph aureus which was methicillin-sensitive.  The patient's ear      swelling is greatly improved at this time.  Pustules are noted to      be decreased and drying.  At the time,  we will continue p.o. Ceftin      for an additional 7 days.  2. Diabetes type 2.  The patient's diabetes was noted to be      suboptimally controlled.  His A1c was 7.6.  Early in his admission,      the glipizide was increased from 5 mg to 10 mg once daily.  As the      patient had been taking generic prior to admission, we will      continue glipizide 5 mg p.o. b.i.d. at time of discharge.  In      addition, we will add metformin 500 mg p.o. b.i.d. to hopefully      improve glycemic control.  3. Mild thrombocytopenia.  The patient was noted to have a drop in his      platelet count down to 119,000.  This was  likely secondary to acute      infection.  Follow-up platelet count was 161,000.  This will need      outpatient follow-up.   The patient did note some vague complaints of shortness of breath and  chest pain prior to admission.  As a result, we did pursue a CT angio of  the chest which was negative for PE.   MEDICATIONS:  At time of discharge.  1. Ceftin 500 mg p.o. b.i.d. for 7 days.  2. Glipizide 5 mg p.o. b.i.d.  3. Metformin 500 mg p.o. b.i.d.  4. Lisinopril 10 mg p.o. daily.  5. Crestor 10 mg p.o. daily in the evening.   PERTINENT LABORATORY DATA:  At discharge, herpes zoster culture is  pending.  Urine culture:  Rare staph aureus.  Blood culture April 21, 2006 remained no growth to date at this time.  Platelets 161,000.   DISPOSITION:  The patient will be discharged to home.  He is scheduled  to follow up with Dr. Dianna Limbo on Tuesday, March 4 at 1:30 p.m.  He is instructed to monitor his blood sugars and bring record to his  appointment with Dr. Scotty Court.  He is also instructed call Dr. Scotty Court  should she develop worsening swelling, fever over 101.      Sandford Craze, NP      Raenette Rover. Felicity Coyer, MD  Electronically Signed    MO/MEDQ  D:  04/26/2006  T:  04/26/2006  Job:  [id number]   cc:   Ellin Saba., MD

## 2010-07-14 NOTE — Op Note (Signed)
[facility]  Patient:    Ricardo [name], Ricardo [name] Visit Number: [id number] MRN: [id number]          Service Type: DSU Location: [facility] Attending Physician:  Maryanna Shape Dictated by:   Reynolds Bowl, M.D. 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  OPERATIVE PROCEDURES:  Partial exostectomy of lateral epicondyle and release of extensor carpi radialis brevis.  DESCRIPTION OF PROCEDURE:  The patient was given a general anesthetic by endotracheal tube.  The left upper extremity was prepped and draped in usual manner.  This included a proximal tourniquet which was elevated 250 mmHg after exsanguinated with an Esmarch bandage.  The skin was marked with a marking pen, outlining the underlying anatomy.  A 4-5 cm incision was made centered over the lateral epicondyle, slightly anterior.  The subcutaneous tissues were elevated, exposing the lateral epicondyle, the extensor fascia, and the extensor fascia extensor carpi radialis junction was opened along the lateral ridge and the underlying brevis elevated.  There was a small amount gray change in this tendinous portion; this was excised.  The lateral epicondyle area which he most frequently had a lot of pain was exposed and a 2 mm thick wafer of epicondyle, small amount of the ridge, was excised with the osteotome, leaving petechial bleeding areas behind.  The area was irrigated.  The extensor brevis muscle was allowed to lie back in place, then the myofascia was approximated to the extensor fascia.  The superficial fascia was approximated with 3-0 Vicryl and the subcutaneous tissues were approximated with 3-0 Vicryl, at which point the skin edges appeared anatomic.  I therefore cleansed, dried the skin, applied  Benzoin, and covered the skin edges with Steri-Strips.  The wound was then covered with 4x4s, Kerlix, sterile Webril, and a posterior splint applied, followed by a sling.  INSTRUCTIONS TO THE PATIENT:  Elevate the arm on a pillow, do frequent finger motion exercises, take Vicodin for pain, call me with concerns; otherwise, see me this coming Wednesday in the office. Dictated by:   Reynolds Bowl, M.D. Attending Physician:  Maryanna Shape DD:  07/17/01 TD:  07/18/01 Job: [id number]

## 2010-07-18 ENCOUNTER — Encounter: Payer: Self-pay | Admitting: Family Medicine

## 2010-07-18 ENCOUNTER — Ambulatory Visit (INDEPENDENT_AMBULATORY_CARE_PROVIDER_SITE_OTHER): Payer: PRIVATE HEALTH INSURANCE | Admitting: Family Medicine

## 2010-07-18 DIAGNOSIS — E119 Type 2 diabetes mellitus without complications: Secondary | ICD-10-CM

## 2010-07-18 DIAGNOSIS — B009 Herpesviral infection, unspecified: Secondary | ICD-10-CM

## 2010-07-18 DIAGNOSIS — G8929 Other chronic pain: Secondary | ICD-10-CM

## 2010-07-18 DIAGNOSIS — M549 Dorsalgia, unspecified: Secondary | ICD-10-CM

## 2010-07-18 MED ORDER — ACYCLOVIR 400 MG PO TABS: 400.0000 mg | ORAL_TABLET | Freq: Three times a day (TID) | ORAL | Status: DC

## 2010-07-18 NOTE — Patient Instructions (Addendum)
Since you desire to try natural fooods to treat diabetes and not take actos appt in 3 months for HgbA1C , continue Glucotrol 20 mg q.d. As well as metformin 1000 mg b.i.d.; decrease intake of carbs

## 2010-07-31 NOTE — Progress Notes (Signed)
  Subjective:    Patient ID: Ricardo [name], male    DOB: 12-03-1950, 60 y.o.   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 He is in today for [place] to discuss his diabetes mellitus with a hemoglobin A1c of 8.1 he admits he has not been taking his medication properly as well as Dieting poorly he continues to be very much concerned about his herpes simplex 2 and takes Zovirax on a regular basis. He continues to have problems with his wife who continues not to accept the fact her forgive him for his infidelity causes termites anxiety and unhappiness. Patient is waiting for a decision by the [employer] and Social Security service to determine his disability and/or retirementHPI    Review of Systemssee history of present illness no other symptoms are reviewed     Objective:   Physical Exam patient is well-developed well-nourished white male who does not appear in any distress Concerned about one lesion on his body which is not herpes No other positive findings        Assessment & Plan:  Diabetes mellitus type 2 under poor control recommend better diet take metformin 1000 mg b.i.d. And Glucotrol 10 mg b.i.d. Recheck in one by one CM 3 mild Hypertension control Herpes simplex 2 continue Zovirax 400 mg t.i.d. On regular basis

## 2010-09-25 ENCOUNTER — Other Ambulatory Visit: Payer: Self-pay | Admitting: Family Medicine

## 2010-09-27 ENCOUNTER — Other Ambulatory Visit: Payer: Self-pay | Admitting: Family Medicine

## 2010-09-28 ENCOUNTER — Other Ambulatory Visit: Payer: PRIVATE HEALTH INSURANCE

## 2010-10-05 ENCOUNTER — Encounter: Payer: PRIVATE HEALTH INSURANCE | Admitting: Family Medicine

## 2010-10-18 ENCOUNTER — Other Ambulatory Visit (INDEPENDENT_AMBULATORY_CARE_PROVIDER_SITE_OTHER): Payer: PRIVATE HEALTH INSURANCE

## 2010-10-18 ENCOUNTER — Ambulatory Visit: Payer: PRIVATE HEALTH INSURANCE

## 2010-10-18 DIAGNOSIS — Z0000 Encounter for general adult medical examination without abnormal findings: Secondary | ICD-10-CM

## 2010-10-18 LAB — MICROALBUMIN / CREATININE URINE RATIO
Creatinine,U: 62.6 mg/dL
Microalb Creat Ratio: 0.3 mg/g (ref 0.0–30.0)
Microalb, Ur: 0.2 mg/dL (ref 0.0–1.9)

## 2010-10-18 LAB — CBC WITH DIFFERENTIAL/PLATELET
Basophils Relative: 0.8 % (ref 0.0–3.0)
Eosinophils Absolute: 0.2 10*3/uL (ref 0.0–0.7)
HCT: 43.8 % (ref 39.0–52.0)
Hemoglobin: 14.4 g/dL (ref 13.0–17.0)
Lymphs Abs: 1.9 10*3/uL (ref 0.7–4.0)
MCHC: 32.9 g/dL (ref 30.0–36.0)
MCV: 90.8 fl (ref 78.0–100.0)
Monocytes Absolute: 0.6 10*3/uL (ref 0.1–1.0)
Neutro Abs: 4.4 10*3/uL (ref 1.4–7.7)
RBC: 4.82 Mil/uL (ref 4.22–5.81)

## 2010-10-18 LAB — POCT URINALYSIS DIPSTICK
Blood, UA: NEGATIVE
Ketones, UA: NEGATIVE
Protein, UA: NEGATIVE
Spec Grav, UA: 1.01
pH, UA: 6

## 2010-10-18 LAB — BASIC METABOLIC PANEL
CO2: 29 mEq/L (ref 19–32)
Chloride: 100 mEq/L (ref 96–112)
Creatinine, Ser: 1 mg/dL (ref 0.4–1.5)
Potassium: 4.6 mEq/L (ref 3.5–5.1)
Sodium: 138 mEq/L (ref 135–145)

## 2010-10-18 LAB — LIPID PANEL
Cholesterol: 189 mg/dL (ref 0–200)
HDL: 47.7 mg/dL (ref >39.00)
Triglycerides: 95 mg/dL (ref 0.0–149.0)
VLDL: 19 mg/dL (ref 0.0–40.0)

## 2010-10-18 LAB — HEPATIC FUNCTION PANEL
ALT: 18 U/L (ref 0–53)
Albumin: 4.4 g/dL (ref 3.5–5.2)
Total Protein: 7 g/dL (ref 6.0–8.3)

## 2010-10-25 ENCOUNTER — Ambulatory Visit (INDEPENDENT_AMBULATORY_CARE_PROVIDER_SITE_OTHER): Payer: PRIVATE HEALTH INSURANCE | Admitting: Family Medicine

## 2010-10-25 ENCOUNTER — Encounter: Payer: Self-pay | Admitting: Family Medicine

## 2010-10-25 VITALS — BP 126/86 | HR 96 | Temp 98.5°F | Resp 12 | Ht 65.5 in | Wt 174.0 lb

## 2010-10-25 DIAGNOSIS — M5030 Other cervical disc degeneration, unspecified cervical region: Secondary | ICD-10-CM

## 2010-10-25 DIAGNOSIS — G8929 Other chronic pain: Secondary | ICD-10-CM

## 2010-10-25 DIAGNOSIS — M1610 Unilateral primary osteoarthritis, unspecified hip: Secondary | ICD-10-CM

## 2010-10-25 DIAGNOSIS — I10 Essential (primary) hypertension: Secondary | ICD-10-CM

## 2010-10-25 DIAGNOSIS — E119 Type 2 diabetes mellitus without complications: Secondary | ICD-10-CM

## 2010-10-25 DIAGNOSIS — M549 Dorsalgia, unspecified: Secondary | ICD-10-CM

## 2010-10-25 DIAGNOSIS — E039 Hypothyroidism, unspecified: Secondary | ICD-10-CM

## 2010-10-25 DIAGNOSIS — Z0000 Encounter for general adult medical examination without abnormal findings: Secondary | ICD-10-CM

## 2010-10-25 DIAGNOSIS — Z8619 Personal history of other infectious and parasitic diseases: Secondary | ICD-10-CM

## 2010-10-25 NOTE — Patient Instructions (Addendum)
I would like for you to get over-the-counter zyrtec and take one tablet each day for 2 weeks at the onset of any lesion that you think is a breakout  Like to recommend that you make an apptwith Dr. Para Skeans to discuss herpes and it would be advisable if you could when you do have a lesion Pleased the hemoglobin A1c has come down to 7 try  to keep it at least 7 or less repeat the hemoglobin A1c and lipid profile in 3 months

## 2010-10-30 ENCOUNTER — Encounter: Payer: Self-pay | Admitting: Family Medicine

## 2010-10-30 NOTE — Progress Notes (Signed)
  Subjective:    Patient ID: Ricardo [name], male    DOB: 1950-04-24, 60 y.o.   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 He has multiple medical problems including cervical disc degeneration chronic back pain arthritis of the right heel fact low back pain for which he has had surgery in the history of lateral epicondylitis bilaterally hypertension controlled 126/86 also diabetes mellitus for which he is attempting to control better. He relates at this time he is doing rather well and is in for a routine physical examination as well as to discuss his history of herpes simplex 2 of which he has considerable emotional psychological problem ear gaining unusual dermatological lesion occurs he is concerned that it is herpes to this has caused considerable problem with his wife which she does not forget the infidelity You have other systems negative at this time HPI    Review of Systems see history of present illness     Objective:   Physical Exam the patient is a well-built well-nourished muscular white male who appears to be in no distress very talkative and cooperative HEENT reveals no abnormalities at your is clear nasal mucosa negative no congestion oropharynx negative him a carotid pulses good thyroid nonpalpable Lungs clear to palpation percussion auscultation no rales are heard no wheezing no dullness Heart no evidence of cardiomegaly heart sounds are good without murmurs regular rhythm peripheral pulses are good and equal bilaterally Abdomen liver spleen kidneys are nonpalpable no masses felt bowel sounds normal Genitalia normal no evidence of any herpetic lesions Rectal examination negative prostate normal no nodules firm Extremities and spine reveals cervical scars as well as scars over the lumbosacral spine no tenderness no abnormalities this time no limitation straight leg  raising Tenderness over the right hip joint minimal Skin no lesions present , no nevi Psychological patient as assessed with having had herpes simplex 2 in the past and causes considerable anxiety        Assessment & Plan:  Routine physical examination reveals a healthy male with past history of multiple injuries but functioning very well at this time Herpes simplex 2 no lesions present Diabetes mellitus2 under much better control with hemoglobin A1c of 7 Attention controlled Hypothyroidism controlled Hyperlipidemia controlled continue Lipitor 20 mg daily Hypertension controlled continue lisinopril milligrams daily

## 2010-11-17 LAB — COMPREHENSIVE METABOLIC PANEL
ALT: 17
AST: 12
Alkaline Phosphatase: 35 — ABNORMAL LOW
CO2: 29
Chloride: 100
GFR calc Af Amer: >60
GFR calc non Af Amer: >60
Glucose, Bld: 176 — ABNORMAL HIGH
Potassium: 3.8
Sodium: 138
Total Bilirubin: 1

## 2010-11-17 LAB — HEMOGLOBIN A1C: Mean Plasma Glucose: 168

## 2010-11-17 LAB — CBC
HCT: 40.3
HCT: 42.9
MCHC: 33.9
MCHC: 34.4
MCV: 88.6
MCV: 89.4
Platelets: 226
RBC: 4.55
WBC: 11.1 — ABNORMAL HIGH
WBC: 9.2

## 2010-11-17 LAB — I-STAT 8, (EC8 V) (CONVERTED LAB)
BUN: 15
Bicarbonate: 27.8 — ABNORMAL HIGH
Glucose, Bld: 130 — ABNORMAL HIGH
Potassium: 4.2
TCO2: 29
pH, Ven: 7.331 — ABNORMAL HIGH

## 2010-11-17 LAB — CK TOTAL AND CKMB (NOT AT ARMC)
CK, MB: 1.4
Relative Index: 1
Relative Index: 1.3

## 2010-11-17 LAB — POCT CARDIAC MARKERS
CKMB, poc: 1.1
Myoglobin, poc: 126

## 2010-11-17 LAB — BASIC METABOLIC PANEL
BUN: 11
CO2: 33 — ABNORMAL HIGH
Chloride: 101
Glucose, Bld: 165 — ABNORMAL HIGH
Potassium: 3.9

## 2010-11-17 LAB — LIPID PANEL
Cholesterol: 150
HDL: 33 — ABNORMAL LOW

## 2010-11-17 LAB — TROPONIN I
Troponin I: 0.03
Troponin I: 0.03

## 2010-11-17 LAB — POCT I-STAT CREATININE: Creatinine, Ser: 1.2

## 2011-01-02 ENCOUNTER — Telehealth: Payer: Self-pay | Admitting: Family Medicine

## 2011-01-02 NOTE — Telephone Encounter (Signed)
I was a patient of Dr Scotty Court, I need to know when I was put on the medication Crestor. Please call me, on my cell #[phone]. Thanks

## 2011-01-04 NOTE — Telephone Encounter (Signed)
Left a message for pt to return call.  Called to make pt aware that per our records Crestor 10 mg was started on 07/16/2007.

## 2011-01-07 ENCOUNTER — Other Ambulatory Visit: Payer: Self-pay | Admitting: Family Medicine

## 2011-01-09 ENCOUNTER — Telehealth: Payer: Self-pay | Admitting: Family Medicine

## 2011-01-09 NOTE — Telephone Encounter (Signed)
Pt called and said that he had spoken with Dr Clent Ridges re: taking over care for pt and spouse and was told to call Dr Clent Ridges back for decision. Pls call back asap.

## 2011-01-09 NOTE — Telephone Encounter (Signed)
Pt called and needs to know the date and year that he started on Crestor?

## 2011-01-10 NOTE — Telephone Encounter (Signed)
Please tell him that I am overfilled with patients and that I cannot see him or his wife. They will need to find another PCP. I cannot tell from his chart when the Crestor was started.

## 2011-01-11 NOTE — Telephone Encounter (Signed)
Called and pt is aware.  Pt states he will call [facility] to establish with a new pcp.

## 2011-01-12 NOTE — Telephone Encounter (Signed)
Pt aware Dr.Fry is full at this time. Pt requested chart be ordered. Will call pt when chart is received.

## 2011-02-06 ENCOUNTER — Other Ambulatory Visit: Payer: PRIVATE HEALTH INSURANCE

## 2011-03-12 ENCOUNTER — Encounter: Payer: Self-pay | Admitting: Internal Medicine

## 2011-03-14 ENCOUNTER — Encounter: Payer: Self-pay | Admitting: Internal Medicine

## 2011-03-14 ENCOUNTER — Ambulatory Visit (INDEPENDENT_AMBULATORY_CARE_PROVIDER_SITE_OTHER): Payer: PRIVATE HEALTH INSURANCE | Admitting: Internal Medicine

## 2011-03-14 VITALS — BP 148/98 | HR 88 | Temp 99.0°F | Resp 16 | Ht 66.0 in | Wt 179.0 lb

## 2011-03-14 DIAGNOSIS — I10 Essential (primary) hypertension: Secondary | ICD-10-CM

## 2011-03-14 DIAGNOSIS — N320 Bladder-neck obstruction: Secondary | ICD-10-CM

## 2011-03-14 DIAGNOSIS — M545 Low back pain, unspecified: Secondary | ICD-10-CM | POA: Insufficient documentation

## 2011-03-14 DIAGNOSIS — Z0000 Encounter for general adult medical examination without abnormal findings: Secondary | ICD-10-CM

## 2011-03-14 DIAGNOSIS — E119 Type 2 diabetes mellitus without complications: Secondary | ICD-10-CM

## 2011-03-14 DIAGNOSIS — E039 Hypothyroidism, unspecified: Secondary | ICD-10-CM

## 2011-03-14 DIAGNOSIS — E785 Hyperlipidemia, unspecified: Secondary | ICD-10-CM

## 2011-03-14 MED ORDER — VITAMIN D 1000 UNITS PO TABS: 1000.0000 [IU] | ORAL_TABLET | Freq: Every day | ORAL | Status: DC

## 2011-03-14 NOTE — Assessment & Plan Note (Signed)
Continue with current prescription therapy as reflected on the Med list.  

## 2011-03-14 NOTE — Progress Notes (Signed)
  Subjective:    Patient ID: Ricardo [name], male    DOB: 06/02/1950, 61 y.o.   MRN: [id number]  HPI  The patient presents for a follow-up of  chronic hypertension, chronic dyslipidemia, type 2 diabetes controlled with medicines  C/o LBP  Review of Systems  Constitutional: Negative for appetite change, fatigue and unexpected weight change.  HENT: Negative for nosebleeds, congestion, sore throat, sneezing, trouble swallowing and neck pain.   Eyes: Negative for itching and visual disturbance.  Respiratory: Negative for cough.   Cardiovascular: Negative for chest pain, palpitations and leg swelling.  Gastrointestinal: Negative for nausea, diarrhea, blood in stool and abdominal distention.  Genitourinary: Negative for frequency and hematuria.  Musculoskeletal: Positive for back pain and gait problem. Negative for joint swelling.  Skin: Negative for rash.  Neurological: Negative for dizziness, tremors, speech difficulty and weakness.  Psychiatric/Behavioral: Negative for suicidal ideas, sleep disturbance, dysphoric mood and agitation. The patient is not nervous/anxious.        Objective:   Physical Exam  Constitutional: He is oriented to person, place, and time. He appears well-developed.  HENT:  Mouth/Throat: Oropharynx is clear and moist.  Eyes: Conjunctivae are normal. Pupils are equal, round, and reactive to light.  Neck: Normal range of motion. No JVD present. No thyromegaly present.  Cardiovascular: Normal rate, regular rhythm, normal heart sounds and intact distal pulses.  Exam reveals no gallop and no friction rub.   No murmur heard. Pulmonary/Chest: Effort normal and breath sounds normal. No respiratory distress. He has no wheezes. He has no rales. He exhibits no tenderness.  Abdominal: Soft. Bowel sounds are normal. He exhibits no distension and no mass. There is no tenderness. There is no rebound and no guarding.  Musculoskeletal: Normal range of motion. He exhibits  tenderness (LS is stiff and tender). He exhibits no edema.  Lymphadenopathy:    He has no cervical adenopathy.  Neurological: He is alert and oriented to person, place, and time. He has normal reflexes. No cranial nerve deficit. He exhibits normal muscle tone. Coordination normal.  Skin: Skin is warm and dry. No rash noted.  Psychiatric: He has a normal mood and affect. His behavior is normal. Judgment and thought content normal.          Assessment & Plan:

## 2011-03-14 NOTE — Assessment & Plan Note (Signed)
Continue with current prescription therapy as reflected on the Med list. BP Readings from Last 3 Encounters:  03/14/11 148/98  10/25/10 126/86  07/18/10 128/74

## 2011-03-14 NOTE — Assessment & Plan Note (Signed)
Chronic - s/p back surgery Start exercising

## 2011-05-01 ENCOUNTER — Other Ambulatory Visit: Payer: Self-pay | Admitting: Family Medicine

## 2011-05-10 ENCOUNTER — Other Ambulatory Visit: Payer: Self-pay | Admitting: *Deleted

## 2011-05-10 MED ORDER — LEVOTHYROXINE SODIUM 100 MCG PO TABS: 100.0000 ug | ORAL_TABLET | Freq: Every day | ORAL | Status: DC

## 2011-06-05 ENCOUNTER — Other Ambulatory Visit (INDEPENDENT_AMBULATORY_CARE_PROVIDER_SITE_OTHER): Payer: PRIVATE HEALTH INSURANCE

## 2011-06-05 DIAGNOSIS — E039 Hypothyroidism, unspecified: Secondary | ICD-10-CM

## 2011-06-05 DIAGNOSIS — I10 Essential (primary) hypertension: Secondary | ICD-10-CM

## 2011-06-05 DIAGNOSIS — M545 Low back pain: Secondary | ICD-10-CM

## 2011-06-05 DIAGNOSIS — Z0000 Encounter for general adult medical examination without abnormal findings: Secondary | ICD-10-CM

## 2011-06-05 DIAGNOSIS — E119 Type 2 diabetes mellitus without complications: Secondary | ICD-10-CM

## 2011-06-05 DIAGNOSIS — N320 Bladder-neck obstruction: Secondary | ICD-10-CM

## 2011-06-05 DIAGNOSIS — E785 Hyperlipidemia, unspecified: Secondary | ICD-10-CM

## 2011-06-05 LAB — URINALYSIS
Leukocytes, UA: NEGATIVE
Nitrite: NEGATIVE
Specific Gravity, Urine: 1.02 (ref 1.000–1.030)
Urobilinogen, UA: 0.2 (ref 0.0–1.0)

## 2011-06-05 LAB — LIPID PANEL
Cholesterol: 300 mg/dL — ABNORMAL HIGH (ref 0–200)
Total CHOL/HDL Ratio: 7
Triglycerides: 560 mg/dL — ABNORMAL HIGH (ref 0.0–149.0)
VLDL: 112 mg/dL — ABNORMAL HIGH (ref 0.0–40.0)

## 2011-06-05 LAB — HEPATIC FUNCTION PANEL
Albumin: 4.5 g/dL (ref 3.5–5.2)
Alkaline Phosphatase: 43 U/L (ref 39–117)

## 2011-06-05 LAB — LDL CHOLESTEROL, DIRECT: Direct LDL: 135.4 mg/dL

## 2011-06-05 LAB — BASIC METABOLIC PANEL
CO2: 29 mEq/L (ref 19–32)
Chloride: 99 mEq/L (ref 96–112)
Creatinine, Ser: 1.1 mg/dL (ref 0.4–1.5)
Glucose, Bld: 143 mg/dL — ABNORMAL HIGH (ref 70–99)

## 2011-06-05 LAB — TSH: TSH: 1.99 u[IU]/mL (ref 0.35–5.50)

## 2011-06-05 LAB — PSA: PSA: 2.23 ng/mL (ref 0.10–4.00)

## 2011-06-07 ENCOUNTER — Telehealth: Payer: Self-pay | Admitting: *Deleted

## 2011-06-07 ENCOUNTER — Telehealth: Payer: Self-pay | Admitting: Internal Medicine

## 2011-06-07 DIAGNOSIS — Z0389 Encounter for observation for other suspected diseases and conditions ruled out: Secondary | ICD-10-CM

## 2011-06-07 DIAGNOSIS — Z0000 Encounter for general adult medical examination without abnormal findings: Secondary | ICD-10-CM

## 2011-06-07 LAB — HEMOGLOBIN A1C: Hgb A1c MFr Bld: 7.2 % — ABNORMAL HIGH (ref 4.6–6.5)

## 2011-06-07 NOTE — Telephone Encounter (Signed)
Sept CPE labs entered.

## 2011-06-07 NOTE — Telephone Encounter (Signed)
Keep ROV 

## 2011-06-13 ENCOUNTER — Encounter: Payer: Self-pay | Admitting: Internal Medicine

## 2011-06-13 ENCOUNTER — Other Ambulatory Visit: Payer: PRIVATE HEALTH INSURANCE

## 2011-06-13 ENCOUNTER — Ambulatory Visit (INDEPENDENT_AMBULATORY_CARE_PROVIDER_SITE_OTHER): Payer: PRIVATE HEALTH INSURANCE | Admitting: Internal Medicine

## 2011-06-13 VITALS — BP 130/90 | HR 88 | Resp 16 | Wt 175.0 lb

## 2011-06-13 DIAGNOSIS — I10 Essential (primary) hypertension: Secondary | ICD-10-CM

## 2011-06-13 DIAGNOSIS — R7301 Impaired fasting glucose: Secondary | ICD-10-CM

## 2011-06-13 DIAGNOSIS — E119 Type 2 diabetes mellitus without complications: Secondary | ICD-10-CM

## 2011-06-13 DIAGNOSIS — E039 Hypothyroidism, unspecified: Secondary | ICD-10-CM

## 2011-06-13 DIAGNOSIS — L089 Local infection of the skin and subcutaneous tissue, unspecified: Secondary | ICD-10-CM | POA: Insufficient documentation

## 2011-06-13 DIAGNOSIS — A6000 Herpesviral infection of urogenital system, unspecified: Secondary | ICD-10-CM

## 2011-06-13 DIAGNOSIS — M545 Low back pain: Secondary | ICD-10-CM

## 2011-06-13 MED ORDER — ATORVASTATIN CALCIUM 20 MG PO TABS: 20.0000 mg | ORAL_TABLET | Freq: Every day | ORAL | Status: DC

## 2011-06-13 NOTE — Assessment & Plan Note (Signed)
Continue with current prescription therapy as reflected on the Med list.  

## 2011-06-13 NOTE — Assessment & Plan Note (Signed)
Per pt - recurrent I'm not shure about the extensive rashes he is describing. 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

## 2011-06-13 NOTE — Assessment & Plan Note (Signed)
Chronic - states BP stays nl at home, not taking ACE 

## 2011-06-13 NOTE — Patient Instructions (Signed)
Saw [facility] for prostate 

## 2011-06-13 NOTE — Progress Notes (Signed)
Patient ID: Ricardo [name], male   DOB: 02-Mar-1950, 61 y.o.   MRN: [id number]  Subjective:    Patient ID: Ricardo [name], male    DOB: [birth year], 61 y.o.   MRN: [id number]  HPI  The patient presents for a follow-up of  chronic hypertension, chronic dyslipidemia, type 2 diabetes controlled with medicines  C/o LBP C/o herpes rash off and on on the body (??) in different places  BP Readings from Last 3 Encounters:  06/13/11 130/90  03/14/11 148/98  10/25/10 126/86   Wt Readings from Last 3 Encounters:  06/13/11 175 lb (79.379 kg)  03/14/11 179 lb (81.194 kg)  10/25/10 174 lb (78.926 kg)     Review of Systems  Constitutional: Negative for appetite change, fatigue and unexpected weight change.  HENT: Negative for nosebleeds, congestion, sore throat, sneezing, trouble swallowing and neck pain.   Eyes: Negative for itching and visual disturbance.  Respiratory: Negative for cough.   Cardiovascular: Negative for chest pain, palpitations and leg swelling.  Gastrointestinal: Negative for nausea, diarrhea, blood in stool and abdominal distention.  Genitourinary: Negative for frequency and hematuria.  Musculoskeletal: Positive for back pain and gait problem. Negative for joint swelling.  Skin: Negative for rash.  Neurological: Negative for dizziness, tremors, speech difficulty and weakness.  Psychiatric/Behavioral: Negative for suicidal ideas, sleep disturbance, dysphoric mood and agitation. The patient is not nervous/anxious.        Objective:   Physical Exam  Constitutional: He is oriented to person, place, and time. He appears well-developed.  HENT:  Mouth/Throat: Oropharynx is clear and moist.  Eyes: Conjunctivae are normal. Pupils are equal, round, and reactive to light.  Neck: Normal range of motion. No JVD present. No thyromegaly present.  Cardiovascular: Normal rate, regular rhythm, normal heart sounds and intact distal pulses.  Exam reveals no gallop and no friction rub.    No murmur heard. Pulmonary/Chest: Effort normal and breath sounds normal. No respiratory distress. He has no wheezes. He has no rales. He exhibits no tenderness.  Abdominal: Soft. Bowel sounds are normal. He exhibits no distension and no mass. There is no tenderness. There is no rebound and no guarding.  Musculoskeletal: Normal range of motion. He exhibits tenderness (LS is stiff and tender). He exhibits no edema.  Lymphadenopathy:    He has no cervical adenopathy.  Neurological: He is alert and oriented to person, place, and time. He has normal reflexes. No cranial nerve deficit. He exhibits normal muscle tone. Coordination normal.  Skin: Skin is warm and dry. No rash noted.  Psychiatric: He has a normal mood and affect. His behavior is normal. Judgment and thought content normal.  2 mm pustule on L thigh  - cx obtained  Lab Results  Component Value Date   WBC 7.2 10/18/2010   HGB 14.4 10/18/2010   HCT 43.8 10/18/2010   PLT 191.0 10/18/2010   GLUCOSE 143* 06/05/2011   CHOL 300* 06/05/2011   TRIG 560.0* 06/05/2011   HDL 41.50 06/05/2011   LDLDIRECT 135.4 06/05/2011   LDLCALC 122* 10/18/2010   ALT 18 06/05/2011   AST 16 06/05/2011   NA 137 06/05/2011   K 4.6 06/05/2011   CL 99 06/05/2011   CREATININE 1.1 06/05/2011   BUN 18 06/05/2011   CO2 29 06/05/2011   TSH 1.99 06/05/2011   PSA 2.23 06/05/2011   INR 1.0 04/02/2007   HGBA1C 7.2* 06/05/2011   MICROALBUR 0.2 10/18/2010         Assessment & Plan:

## 2011-06-16 LAB — WOUND CULTURE
Gram Stain: NONE SEEN
Organism ID, Bacteria: NO GROWTH

## 2011-08-03 ENCOUNTER — Other Ambulatory Visit: Payer: Self-pay | Admitting: Family Medicine

## 2011-08-03 ENCOUNTER — Other Ambulatory Visit: Payer: Self-pay | Admitting: Internal Medicine

## 2011-08-03 NOTE — Telephone Encounter (Signed)
Dr. Posey Rea pt

## 2011-08-08 ENCOUNTER — Other Ambulatory Visit: Payer: Self-pay | Admitting: *Deleted

## 2011-08-08 MED ORDER — GLUCOSE BLOOD VI STRP: ORAL_STRIP | Status: DC

## 2011-08-21 ENCOUNTER — Other Ambulatory Visit: Payer: Self-pay | Admitting: Family Medicine

## 2011-08-27 ENCOUNTER — Other Ambulatory Visit: Payer: Self-pay | Admitting: Internal Medicine

## 2011-09-05 ENCOUNTER — Other Ambulatory Visit: Payer: Self-pay | Admitting: Internal Medicine

## 2011-09-06 ENCOUNTER — Ambulatory Visit (INDEPENDENT_AMBULATORY_CARE_PROVIDER_SITE_OTHER): Payer: PRIVATE HEALTH INSURANCE | Admitting: Internal Medicine

## 2011-09-06 ENCOUNTER — Encounter: Payer: Self-pay | Admitting: Internal Medicine

## 2011-09-06 VITALS — BP 122/86 | HR 80 | Temp 98.3°F | Resp 16 | Wt 173.0 lb

## 2011-09-06 DIAGNOSIS — L089 Local infection of the skin and subcutaneous tissue, unspecified: Secondary | ICD-10-CM

## 2011-09-06 DIAGNOSIS — E119 Type 2 diabetes mellitus without complications: Secondary | ICD-10-CM

## 2011-09-06 DIAGNOSIS — E039 Hypothyroidism, unspecified: Secondary | ICD-10-CM

## 2011-09-06 DIAGNOSIS — N320 Bladder-neck obstruction: Secondary | ICD-10-CM

## 2011-09-06 MED ORDER — PIOGLITAZONE HCL 30 MG PO TABS: 30.0000 mg | ORAL_TABLET | Freq: Every day | ORAL | Status: DC

## 2011-09-06 MED ORDER — FINASTERIDE 5 MG PO TABS: 5.0000 mg | ORAL_TABLET | Freq: Every day | ORAL | Status: DC

## 2011-09-06 MED ORDER — TAMSULOSIN HCL 0.4 MG PO CAPS: 0.4000 mg | ORAL_CAPSULE | Freq: Every day | ORAL | Status: DC

## 2011-09-06 MED ORDER — DOXYCYCLINE HYCLATE 100 MG PO TABS: 100.0000 mg | ORAL_TABLET | Freq: Two times a day (BID) | ORAL | Status: DC

## 2011-09-06 NOTE — Assessment & Plan Note (Signed)
Continue with current prescription therapy as reflected on the Med list.  

## 2011-09-06 NOTE — Progress Notes (Signed)
Subjective:    Patient ID: Ricardo [name], male    DOB: 1950/09/15, 61 y.o.   MRN: [id number]  HPI C/o worsening urinary urgency and frequency The patient presents for a follow-up of  chronic hypertension, chronic dyslipidemia, type 2 diabetes controlled with medicines  F/u LBP F/u herpes rash off and on on the body (??) in different places  BP Readings from Last 3 Encounters:  09/06/11 122/86  06/13/11 130/90  03/14/11 148/98   Wt Readings from Last 3 Encounters:  09/06/11 173 lb (78.472 kg)  06/13/11 175 lb (79.379 kg)  03/14/11 179 lb (81.194 kg)     Review of Systems  Constitutional: Negative for appetite change, fatigue and unexpected weight change.  HENT: Negative for nosebleeds, congestion, sore throat, sneezing, trouble swallowing and neck pain.   Eyes: Negative for itching and visual disturbance.  Respiratory: Negative for cough.   Cardiovascular: Negative for chest pain, palpitations and leg swelling.  Gastrointestinal: Negative for nausea, diarrhea, blood in stool and abdominal distention.  Genitourinary: Positive for urgency, frequency and difficulty urinating. Negative for hematuria.  Musculoskeletal: Positive for back pain and gait problem. Negative for joint swelling.  Skin: Negative for rash.  Neurological: Negative for dizziness, tremors, speech difficulty and weakness.  Psychiatric/Behavioral: Negative for suicidal ideas, disturbed wake/sleep cycle, dysphoric mood and agitation. The patient is not nervous/anxious.        Objective:   Physical Exam  Constitutional: He is oriented to person, place, and time. He appears well-developed.  HENT:  Mouth/Throat: Oropharynx is clear and moist.  Eyes: Conjunctivae are normal. Pupils are equal, round, and reactive to light.  Neck: Normal range of motion. No JVD present. No thyromegaly present.  Cardiovascular: Normal rate, regular rhythm, normal heart sounds and intact distal pulses.  Exam reveals no gallop  and no friction rub.   No murmur heard. Pulmonary/Chest: Effort normal and breath sounds normal. No respiratory distress. He has no wheezes. He has no rales. He exhibits no tenderness.  Abdominal: Soft. Bowel sounds are normal. He exhibits no distension and no mass. There is no tenderness. There is no rebound and no guarding.  Genitourinary: Rectum normal. Guaiac negative stool.       Prostate is 1+ NT  Musculoskeletal: Normal range of motion. He exhibits tenderness (LS is stiff and tender). He exhibits no edema.  Lymphadenopathy:    He has no cervical adenopathy.  Neurological: He is alert and oriented to person, place, and time. He has normal reflexes. No cranial nerve deficit. He exhibits normal muscle tone. Coordination normal.  Skin: Skin is warm and dry. No rash noted.  Psychiatric: He has a normal mood and affect. His behavior is normal. Judgment and thought content normal.  2 mm pustule on R thigh    Lab Results  Component Value Date   WBC 7.2 10/18/2010   HGB 14.4 10/18/2010   HCT 43.8 10/18/2010   PLT 191.0 10/18/2010   GLUCOSE 143* 06/05/2011   CHOL 300* 06/05/2011   TRIG 560.0* 06/05/2011   HDL 41.50 06/05/2011   LDLDIRECT 135.4 06/05/2011   LDLCALC 122* 10/18/2010   ALT 18 06/05/2011   AST 16 06/05/2011   NA 137 06/05/2011   K 4.6 06/05/2011   CL 99 06/05/2011   CREATININE 1.1 06/05/2011   BUN 18 06/05/2011   CO2 29 06/05/2011   TSH 1.99 06/05/2011   PSA 2.23 06/05/2011   INR 1.0 04/02/2007   HGBA1C 7.2* 06/05/2011   MICROALBUR 0.2 10/18/2010  Assessment & Plan:

## 2011-09-06 NOTE — Assessment & Plan Note (Addendum)
Not well controlled. Added Actos

## 2011-09-06 NOTE — Assessment & Plan Note (Signed)
BPH, chronic 7/13 worse.  Options discussed. Start Proscar and Flomax Improve CBG control

## 2011-09-06 NOTE — Assessment & Plan Note (Addendum)
Trial of Doxy for poss folliculitis. Acyclovir hasn't reduced the outbreaks frequency Hibiclense soap

## 2011-10-02 ENCOUNTER — Telehealth: Payer: Self-pay | Admitting: *Deleted

## 2011-10-02 MED ORDER — DOXYCYCLINE HYCLATE 100 MG PO TABS: 100.0000 mg | ORAL_TABLET | Freq: Two times a day (BID) | ORAL | Status: AC

## 2011-10-02 NOTE — Telephone Encounter (Signed)
Rf req for Doxycycline 100 mg 1 po bid. # 28. Ok to Rf?

## 2011-10-02 NOTE — Telephone Encounter (Signed)
OK to fill this prescription with additional refills x0 OV if problems Thank you!

## 2011-10-02 NOTE — Telephone Encounter (Signed)
Done

## 2011-10-26 ENCOUNTER — Other Ambulatory Visit (INDEPENDENT_AMBULATORY_CARE_PROVIDER_SITE_OTHER): Payer: PRIVATE HEALTH INSURANCE

## 2011-10-26 DIAGNOSIS — E119 Type 2 diabetes mellitus without complications: Secondary | ICD-10-CM

## 2011-10-26 DIAGNOSIS — M545 Low back pain: Secondary | ICD-10-CM

## 2011-10-26 DIAGNOSIS — Z0000 Encounter for general adult medical examination without abnormal findings: Secondary | ICD-10-CM

## 2011-10-26 DIAGNOSIS — E039 Hypothyroidism, unspecified: Secondary | ICD-10-CM

## 2011-10-26 DIAGNOSIS — Z0389 Encounter for observation for other suspected diseases and conditions ruled out: Secondary | ICD-10-CM

## 2011-10-26 DIAGNOSIS — I10 Essential (primary) hypertension: Secondary | ICD-10-CM

## 2011-10-26 LAB — URINALYSIS, ROUTINE W REFLEX MICROSCOPIC
Bilirubin Urine: NEGATIVE
Hgb urine dipstick: NEGATIVE
Leukocytes, UA: NEGATIVE
Nitrite: NEGATIVE
Total Protein, Urine: NEGATIVE
pH: 6.5 (ref 5.0–8.0)

## 2011-10-26 LAB — BASIC METABOLIC PANEL
BUN: 15 mg/dL (ref 6–23)
Creatinine, Ser: 1.2 mg/dL (ref 0.4–1.5)
GFR: 66.35 mL/min (ref >60.00)
Glucose, Bld: 133 mg/dL — ABNORMAL HIGH (ref 70–99)

## 2011-10-26 LAB — CBC WITH DIFFERENTIAL/PLATELET
Basophils Absolute: 0.1 10*3/uL (ref 0.0–0.1)
Eosinophils Absolute: 0.3 10*3/uL (ref 0.0–0.7)
Hemoglobin: 13.7 g/dL (ref 13.0–17.0)
Lymphocytes Relative: 26.4 % (ref 12.0–46.0)
MCHC: 32.9 g/dL (ref 30.0–36.0)
Neutro Abs: 3.7 10*3/uL (ref 1.4–7.7)
Neutrophils Relative %: 58.4 % (ref 43.0–77.0)
RDW: 13.1 % (ref 11.5–14.6)

## 2011-10-26 LAB — LIPID PANEL
Cholesterol: 167 mg/dL (ref 0–200)
HDL: 48.1 mg/dL (ref >39.00)
LDL Cholesterol: 96 mg/dL (ref 0–99)
Triglycerides: 114 mg/dL (ref 0.0–149.0)
VLDL: 22.8 mg/dL (ref 0.0–40.0)

## 2011-10-26 LAB — HEPATIC FUNCTION PANEL
Albumin: 4.3 g/dL (ref 3.5–5.2)
Total Bilirubin: 0.7 mg/dL (ref 0.3–1.2)

## 2011-10-26 LAB — PSA: PSA: 1.04 ng/mL (ref 0.10–4.00)

## 2011-10-30 ENCOUNTER — Encounter: Payer: Self-pay | Admitting: Internal Medicine

## 2011-10-30 ENCOUNTER — Ambulatory Visit (INDEPENDENT_AMBULATORY_CARE_PROVIDER_SITE_OTHER): Payer: PRIVATE HEALTH INSURANCE | Admitting: Internal Medicine

## 2011-10-30 VITALS — BP 150/84 | HR 80 | Temp 99.5°F | Resp 16 | Ht 66.0 in | Wt 170.0 lb

## 2011-10-30 DIAGNOSIS — N4289 Other specified disorders of prostate: Secondary | ICD-10-CM

## 2011-10-30 DIAGNOSIS — N4282 Prostatosis syndrome: Secondary | ICD-10-CM | POA: Insufficient documentation

## 2011-10-30 DIAGNOSIS — N320 Bladder-neck obstruction: Secondary | ICD-10-CM

## 2011-10-30 DIAGNOSIS — A6000 Herpesviral infection of urogenital system, unspecified: Secondary | ICD-10-CM

## 2011-10-30 DIAGNOSIS — I10 Essential (primary) hypertension: Secondary | ICD-10-CM

## 2011-10-30 DIAGNOSIS — Z1211 Encounter for screening for malignant neoplasm of colon: Secondary | ICD-10-CM

## 2011-10-30 DIAGNOSIS — E119 Type 2 diabetes mellitus without complications: Secondary | ICD-10-CM

## 2011-10-30 DIAGNOSIS — Z0000 Encounter for general adult medical examination without abnormal findings: Secondary | ICD-10-CM

## 2011-10-30 DIAGNOSIS — Z136 Encounter for screening for cardiovascular disorders: Secondary | ICD-10-CM

## 2011-10-30 DIAGNOSIS — E785 Hyperlipidemia, unspecified: Secondary | ICD-10-CM

## 2011-10-30 MED ORDER — DOXYCYCLINE HYCLATE 100 MG PO TABS: 100.0000 mg | ORAL_TABLET | Freq: Two times a day (BID) | ORAL | Status: AC

## 2011-10-30 MED ORDER — ATORVASTATIN CALCIUM 20 MG PO TABS: 20.0000 mg | ORAL_TABLET | Freq: Every day | ORAL | Status: DC

## 2011-10-30 NOTE — Assessment & Plan Note (Signed)
Continue with current prescription therapy as reflected on the Med list.  

## 2011-10-30 NOTE — Assessment & Plan Note (Signed)
Monitor BP at home - call if not nl

## 2011-10-30 NOTE — Assessment & Plan Note (Signed)
We discussed age appropriate health related issues, including available/recomended screening tests and vaccinations. We discussed a need for adhering to healthy diet and exercise. Labs/EKG were reviewed/ordered. All questions were answered.  Zostavax suggested Colon is due

## 2011-10-30 NOTE — Assessment & Plan Note (Signed)
I haven't seen lesions

## 2011-10-30 NOTE — Progress Notes (Signed)
Patient ID: Ricardo [name], male   DOB: Jun 07, 1950, 61 y.o.   MRN: [id number]   Subjective:    Patient ID: Ricardo [name], male    DOB: Oct 30, 1950, 61 y.o.   MRN: [id number]  HPI  The patient is here for a wellness exam. The patient has been doing well overall without major physical or psychological issues going on lately. The patient presents for a follow-up of  chronic hypertension, chronic dyslipidemia, type 2 diabetes controlled with medicines  F/u LBP F/u herpes rash off and on on the body (??) in different places  BP Readings from Last 3 Encounters:  10/30/11 150/84  09/06/11 122/86  06/13/11 130/90   Wt Readings from Last 3 Encounters:  10/30/11 170 lb (77.111 kg)  09/06/11 173 lb (78.472 kg)  06/13/11 175 lb (79.379 kg)     Review of Systems  Constitutional: Negative for appetite change, fatigue and unexpected weight change.  HENT: Negative for nosebleeds, congestion, sore throat, sneezing, trouble swallowing and neck pain.   Eyes: Negative for itching and visual disturbance.  Respiratory: Negative for cough.   Cardiovascular: Negative for chest pain, palpitations and leg swelling.  Gastrointestinal: Negative for nausea, diarrhea, blood in stool and abdominal distention.  Genitourinary: Positive for urgency, frequency and difficulty urinating. Negative for hematuria.  Musculoskeletal: Positive for back pain and gait problem. Negative for joint swelling.  Skin: Negative for rash.  Neurological: Negative for dizziness, tremors, speech difficulty and weakness.  Psychiatric/Behavioral: Negative for suicidal ideas, disturbed wake/sleep cycle, dysphoric mood and agitation. The patient is not nervous/anxious.        Objective:   Physical Exam  Constitutional: He is oriented to person, place, and time. He appears well-developed.  HENT:  Mouth/Throat: Oropharynx is clear and moist.  Eyes: Conjunctivae are normal. Pupils are equal, round, and reactive to light.  Neck:  Normal range of motion. No JVD present. No thyromegaly present.  Cardiovascular: Normal rate, regular rhythm, normal heart sounds and intact distal pulses.  Exam reveals no gallop and no friction rub.   No murmur heard. Pulmonary/Chest: Effort normal and breath sounds normal. No respiratory distress. He has no wheezes. He has no rales. He exhibits no tenderness.  Abdominal: Soft. Bowel sounds are normal. He exhibits no distension and no mass. There is no tenderness. There is no rebound and no guarding.  Genitourinary: Rectum normal. Guaiac negative stool.       Prostate is 1+ NT  Musculoskeletal: Normal range of motion. He exhibits tenderness (LS is stiff and tender). He exhibits no edema.  Lymphadenopathy:    He has no cervical adenopathy.  Neurological: He is alert and oriented to person, place, and time. He has normal reflexes. No cranial nerve deficit. He exhibits normal muscle tone. Coordination normal.  Skin: Skin is warm and dry. No rash noted.  Psychiatric: He has a normal mood and affect. His behavior is normal. Judgment and thought content normal.    Lab Results  Component Value Date   WBC 6.3 10/26/2011   HGB 13.7 10/26/2011   HCT 41.5 10/26/2011   PLT 195.0 10/26/2011   GLUCOSE 133* 10/26/2011   CHOL 167 10/26/2011   TRIG 114.0 10/26/2011   HDL 48.10 10/26/2011   LDLDIRECT 135.4 06/05/2011   LDLCALC 96 10/26/2011   ALT 19 10/26/2011   AST 18 10/26/2011   NA 139 10/26/2011   K 5.2* 10/26/2011   CL 103 10/26/2011   CREATININE 1.2 10/26/2011   BUN 15 10/26/2011   CO2 30 10/26/2011  TSH 1.88 10/26/2011   PSA 1.04 10/26/2011   INR 1.0 04/02/2007   HGBA1C 6.9* 10/26/2011   MICROALBUR 0.2 10/18/2010         Assessment & Plan:

## 2011-10-30 NOTE — Assessment & Plan Note (Signed)
Doing better.   

## 2011-10-30 NOTE — Assessment & Plan Note (Signed)
Will try Doxy x 1 mo again

## 2011-11-05 ENCOUNTER — Other Ambulatory Visit: Payer: Self-pay | Admitting: *Deleted

## 2011-11-05 MED ORDER — TAMSULOSIN HCL 0.4 MG PO CAPS: 0.4000 mg | ORAL_CAPSULE | Freq: Every day | ORAL | Status: DC

## 2011-11-06 ENCOUNTER — Other Ambulatory Visit: Payer: Self-pay | Admitting: *Deleted

## 2011-11-06 MED ORDER — TAMSULOSIN HCL 0.4 MG PO CAPS: 0.4000 mg | ORAL_CAPSULE | Freq: Every day | ORAL | Status: DC

## 2011-12-20 ENCOUNTER — Telehealth: Payer: Self-pay | Admitting: Internal Medicine

## 2011-12-20 MED ORDER — MIRABEGRON ER 50 MG PO TB24: 50.0000 mg | ORAL_TABLET | Freq: Every day | ORAL | Status: DC

## 2011-12-20 NOTE — Telephone Encounter (Signed)
Pt informed of MD's advisement-samples of Myrbetriq 50mg  placed upfront in cabinet for pt to pickup.

## 2011-12-20 NOTE — Telephone Encounter (Signed)
Caller: [name]/Patient; Patient Name: Ricardo [name]; PCP: [name], Alex (Adults only); [facility] Phone Number: [phone] Pt is calling because he continues to have issues with Prostate enlargement. He completed medication physician prescribed and wants to know if he can try another medication prior to coming back into office for an appointment. He c/o of urinary frequency, feeling pressure in lower abdomen, a few seconds of  hesitancy getting urine stream to start at times. Emergent s/s of Urinary symptoms protocol r/o. Pt to see provider within 72hrs. Pt wants to try another medication before coming into office again for an appointment. Pharmacy is [facility].

## 2011-12-20 NOTE — Telephone Encounter (Signed)
Cont Flomax Add Myrbetriq 50 mg a day - samples/voucher to pick up here  Thx

## 2011-12-21 ENCOUNTER — Telehealth: Payer: Self-pay | Admitting: Internal Medicine

## 2011-12-21 NOTE — Telephone Encounter (Signed)
Pt informed of MD's advisement, did not see discount card in samples. Put another upfront in cabinet for pt to pickup, he will come by Monday to do so.

## 2011-12-21 NOTE — Telephone Encounter (Signed)
Caller: [name]/Patient; Patient Name: Ricardo [name]; PCP: [name], Alex (Adults only); [facility] Phone Number: [phone]. Call regarding script for MYRBETRIQ 50 mg.   Pt went to pick up medication and it was over $100. Pt wants to know  if there is another medication that is equivalant to this medicine in a generic form or if he can have another 14 day supply of the  medication to try for 30 days.  No triage.     PLEASE FOLLOW UP WITH PT IN REGARD TO SCRIPT and  SAMPLES.  Thank you.

## 2011-12-21 NOTE — Telephone Encounter (Signed)
I gave him a card w/samples; it should have been free x 1 month (6 wks total free) and discounted price in the future We have used other generic meds w/no much help previously.Thx

## 2012-01-07 ENCOUNTER — Ambulatory Visit (INDEPENDENT_AMBULATORY_CARE_PROVIDER_SITE_OTHER): Payer: PRIVATE HEALTH INSURANCE | Admitting: Internal Medicine

## 2012-01-07 ENCOUNTER — Encounter: Payer: Self-pay | Admitting: Internal Medicine

## 2012-01-07 VITALS — BP 140/80 | HR 80 | Resp 16 | Wt 173.0 lb

## 2012-01-07 DIAGNOSIS — R109 Unspecified abdominal pain: Secondary | ICD-10-CM | POA: Insufficient documentation

## 2012-01-07 DIAGNOSIS — I10 Essential (primary) hypertension: Secondary | ICD-10-CM

## 2012-01-07 DIAGNOSIS — E119 Type 2 diabetes mellitus without complications: Secondary | ICD-10-CM

## 2012-01-07 DIAGNOSIS — N4289 Other specified disorders of prostate: Secondary | ICD-10-CM

## 2012-01-07 DIAGNOSIS — N320 Bladder-neck obstruction: Secondary | ICD-10-CM

## 2012-01-07 DIAGNOSIS — N4282 Prostatosis syndrome: Secondary | ICD-10-CM

## 2012-01-07 NOTE — Assessment & Plan Note (Signed)
Urol ref No sx's now Advil/Tylnol prn Repeat Doxy if needed

## 2012-01-07 NOTE — Assessment & Plan Note (Signed)
11/13 diffuse - resolved

## 2012-01-07 NOTE — Assessment & Plan Note (Signed)
Chronic - states BP stays nl at home, not taking ACE

## 2012-01-07 NOTE — Assessment & Plan Note (Addendum)
On Marbetriq -- 11/13 Cont Proscar Urol ref was offered Dr McDiarmid

## 2012-01-07 NOTE — Progress Notes (Signed)
Subjective:    Patient ID: Ricardo [name], male    DOB: Jun 12, 1950, 61 y.o.   MRN: [id number]  Abdominal Pain This is a recurrent problem. The current episode started 1 to 4 weeks ago. The onset quality is gradual. The problem occurs constantly. The problem has been rapidly improving. The pain is located in the suprapubic region, epigastric region and periumbilical region. The pain is at a severity of 5/10. The pain is moderate. The quality of the pain is dull. The abdominal pain does not radiate. Associated symptoms include frequency. Pertinent negatives include no diarrhea, hematuria or nausea. Treatments tried: merbetriq. The treatment provided significant relief.     The patient presents for a follow-up of  chronic hypertension, chronic dyslipidemia, type 2 diabetes controlled with medicines  F/u LBP F/u herpes rash off and on on the body (??) in different places - none lately  BP Readings from Last 3 Encounters:  01/07/12 140/100  10/30/11 150/84  09/06/11 122/86   Wt Readings from Last 3 Encounters:  01/07/12 173 lb (78.472 kg)  10/30/11 170 lb (77.111 kg)  09/06/11 173 lb (78.472 kg)     Review of Systems  Constitutional: Negative for appetite change, fatigue and unexpected weight change.  HENT: Negative for nosebleeds, congestion, sore throat, sneezing, trouble swallowing and neck pain.   Eyes: Negative for itching and visual disturbance.  Respiratory: Negative for cough.   Cardiovascular: Negative for chest pain, palpitations and leg swelling.  Gastrointestinal: Positive for abdominal pain. Negative for nausea, diarrhea, blood in stool and abdominal distention.  Genitourinary: Positive for urgency, frequency and difficulty urinating. Negative for hematuria.  Musculoskeletal: Positive for back pain and gait problem. Negative for joint swelling.  Skin: Negative for rash.  Neurological: Negative for dizziness, tremors, speech difficulty and weakness.    Psychiatric/Behavioral: Negative for suicidal ideas, sleep disturbance, dysphoric mood and agitation. The patient is not nervous/anxious.        Objective:   Physical Exam  Constitutional: He is oriented to person, place, and time. He appears well-developed.  HENT:  Mouth/Throat: Oropharynx is clear and moist.  Eyes: Conjunctivae normal are normal. Pupils are equal, round, and reactive to light.  Neck: Normal range of motion. No JVD present. No thyromegaly present.  Cardiovascular: Normal rate, regular rhythm, normal heart sounds and intact distal pulses.  Exam reveals no gallop and no friction rub.   No murmur heard. Pulmonary/Chest: Effort normal and breath sounds normal. No respiratory distress. He has no wheezes. He has no rales. He exhibits no tenderness.  Abdominal: Soft. Bowel sounds are normal. He exhibits no distension and no mass. There is tenderness (very mild to none, diffuse). There is no rebound and no guarding.  Genitourinary: Rectum normal. Guaiac negative stool.       Prostate is 1+ NT  Musculoskeletal: Normal range of motion. He exhibits tenderness (LS is stiff and tender). He exhibits no edema.  Lymphadenopathy:    He has no cervical adenopathy.  Neurological: He is alert and oriented to person, place, and time. He has normal reflexes. No cranial nerve deficit. He exhibits normal muscle tone. Coordination normal.  Skin: Skin is warm and dry. No rash noted.  Psychiatric: He has a normal mood and affect. His behavior is normal. Judgment and thought content normal.    Lab Results  Component Value Date   WBC 6.3 10/26/2011   HGB 13.7 10/26/2011   HCT 41.5 10/26/2011   PLT 195.0 10/26/2011   GLUCOSE 133* 10/26/2011  CHOL 167 10/26/2011   TRIG 114.0 10/26/2011   HDL 48.10 10/26/2011   LDLDIRECT 135.4 06/05/2011   LDLCALC 96 10/26/2011   ALT 19 10/26/2011   AST 18 10/26/2011   NA 139 10/26/2011   K 5.2* 10/26/2011   CL 103 10/26/2011   CREATININE 1.2 10/26/2011   BUN 15  10/26/2011   CO2 30 10/26/2011   TSH 1.88 10/26/2011   PSA 1.04 10/26/2011   INR 1.0 04/02/2007   HGBA1C 6.9* 10/26/2011   MICROALBUR 0.2 10/18/2010         Assessment & Plan:

## 2012-01-07 NOTE — Assessment & Plan Note (Signed)
Continue with current prescription therapy as reflected on the Med list.  

## 2012-02-28 ENCOUNTER — Other Ambulatory Visit: Payer: Self-pay | Admitting: Internal Medicine

## 2012-04-02 ENCOUNTER — Encounter: Payer: Self-pay | Admitting: Gastroenterology

## 2012-04-25 ENCOUNTER — Ambulatory Visit (INDEPENDENT_AMBULATORY_CARE_PROVIDER_SITE_OTHER): Payer: PRIVATE HEALTH INSURANCE | Admitting: Internal Medicine

## 2012-04-25 ENCOUNTER — Encounter: Payer: Self-pay | Admitting: Internal Medicine

## 2012-04-25 VITALS — BP 130/90 | HR 80 | Temp 98.2°F | Resp 16 | Wt 176.0 lb

## 2012-04-25 DIAGNOSIS — A6000 Herpesviral infection of urogenital system, unspecified: Secondary | ICD-10-CM

## 2012-04-25 DIAGNOSIS — M25512 Pain in left shoulder: Secondary | ICD-10-CM | POA: Insufficient documentation

## 2012-04-25 DIAGNOSIS — M25519 Pain in unspecified shoulder: Secondary | ICD-10-CM

## 2012-04-25 DIAGNOSIS — M67919 Unspecified disorder of synovium and tendon, unspecified shoulder: Secondary | ICD-10-CM

## 2012-04-25 MED ORDER — IBUPROFEN 600 MG PO TABS: 600.0000 mg | ORAL_TABLET | Freq: Three times a day (TID) | ORAL | Status: DC | PRN

## 2012-04-25 NOTE — Assessment & Plan Note (Signed)
See procedure 

## 2012-04-25 NOTE — Patient Instructions (Signed)
Postprocedure instructions :    A Band-Aid should be left on for 12 hours. Injection therapy is not a cure itself. It is used in conjunction with other modalities. You can use nonsteroidal anti-inflammatories like ibuprofen , hot and cold compresses. Rest is recommended in the next 24 hours. You need to report immediately  if fever, chills or any signs of infection develop. 

## 2012-04-27 MED ORDER — METHYLPREDNISOLONE ACETATE 80 MG/ML IJ SUSP: 40.0000 mg | Freq: Once | INTRAMUSCULAR | Status: DC

## 2012-04-27 NOTE — Progress Notes (Signed)
Subjective:    Shoulder Pain  The pain is present in the left shoulder. This is a recurrent problem. The current episode started 1 to 4 weeks ago. There has been no history of extremity trauma. The problem occurs intermittently. The problem has been gradually worsening. The quality of the pain is described as sharp. The pain is at a severity of 7/10. The pain is severe. Pertinent negatives include no fever. He has tried NSAIDS for the symptoms. The treatment provided mild relief. Family history does not include gout or rheumatoid arthritis. His past medical history is significant for osteoarthritis.     The patient presents for a follow-up of  chronic hypertension, chronic dyslipidemia, type 2 diabetes controlled with medicines  F/u LBP F/u herpes rash off and on on the body (??) in different places - none lately  BP Readings from Last 3 Encounters:  04/25/12 130/90  01/07/12 140/80  10/30/11 150/84   Wt Readings from Last 3 Encounters:  04/25/12 176 lb (79.833 kg)  01/07/12 173 lb (78.472 kg)  10/30/11 170 lb (77.111 kg)     Review of Systems  Constitutional: Negative for fever, appetite change, fatigue and unexpected weight change.  HENT: Negative for nosebleeds, congestion, sore throat, sneezing, trouble swallowing and neck pain.   Eyes: Negative for itching and visual disturbance.  Respiratory: Negative for cough.   Cardiovascular: Negative for chest pain, palpitations and leg swelling.  Gastrointestinal: Negative for blood in stool and abdominal distention.  Genitourinary: Positive for urgency and difficulty urinating.  Musculoskeletal: Positive for back pain and gait problem. Negative for joint swelling.  Skin: Negative for rash.  Neurological: Negative for dizziness, tremors, speech difficulty and weakness.  Psychiatric/Behavioral: Negative for suicidal ideas, sleep disturbance, dysphoric mood and agitation. The patient is not nervous/anxious.        Objective:   Physical Exam  Constitutional: He is oriented to person, place, and time. He appears well-developed.  HENT:  Mouth/Throat: Oropharynx is clear and moist.  Eyes: Conjunctivae are normal. Pupils are equal, round, and reactive to light.  Neck: Normal range of motion. No JVD present. No thyromegaly present.  Cardiovascular: Normal rate, regular rhythm, normal heart sounds and intact distal pulses.  Exam reveals no gallop and no friction rub.   No murmur heard. Pulmonary/Chest: Effort normal and breath sounds normal. No respiratory distress. He has no wheezes. He has no rales. He exhibits no tenderness.  Abdominal: Soft. Bowel sounds are normal. He exhibits no distension and no mass. There is no tenderness. There is no rebound and no guarding.  Genitourinary: Rectum normal. Guaiac negative stool.  Musculoskeletal: Normal range of motion. He exhibits tenderness. He exhibits no edema.  Lymphadenopathy:    He has no cervical adenopathy.  Neurological: He is alert and oriented to person, place, and time. He has normal reflexes. No cranial nerve deficit. He exhibits normal muscle tone. Coordination normal.  Skin: Skin is warm and dry. No rash noted.  Psychiatric: He has a normal mood and affect. His behavior is normal. Judgment and thought content normal.  L bicipital tendon is tender; subacr space is ok   Procedure Note :   Biceps tendon Injection L:   Indication : tendonitis   Risks including unsuccessful procedure , bleeding, infection, bruising, skin atrophy and others were explained to the patient in detail as well as the benefits. Informed consent was obtained and signed.   Tthe patient was placed in a comfortable position.   L bicepse tendon was marked  and  the skin was prepped with Betadine and alcohol. 1 inch 25-gauge needle was used. The needle was advanced and the tendon was injected with 1 mL of 2% lidocaine and 20 mg of Depo-Medrol in a usual fashion.  Band-Aids applied.   Tolerated  well. Complications: None. Good pain relief following the procedure.     Assessment & Plan:

## 2012-04-27 NOTE — Assessment & Plan Note (Signed)
Discussed again. Questions answered

## 2012-06-27 ENCOUNTER — Encounter: Payer: Self-pay | Admitting: Gastroenterology

## 2012-07-04 ENCOUNTER — Ambulatory Visit (INDEPENDENT_AMBULATORY_CARE_PROVIDER_SITE_OTHER): Payer: PRIVATE HEALTH INSURANCE | Admitting: Internal Medicine

## 2012-07-04 ENCOUNTER — Encounter: Payer: Self-pay | Admitting: Internal Medicine

## 2012-07-04 VITALS — BP 132/98 | HR 80 | Resp 16 | Wt 166.0 lb

## 2012-07-04 DIAGNOSIS — R21 Rash and other nonspecific skin eruption: Secondary | ICD-10-CM | POA: Insufficient documentation

## 2012-07-04 MED ORDER — METHYLPREDNISOLONE ACETATE 80 MG/ML IJ SUSP
80.0000 mg | Freq: Once | INTRAMUSCULAR | Status: AC
  Administered 2012-07-04: 80 mg via INTRAMUSCULAR

## 2012-07-04 MED ORDER — LORATADINE 10 MG PO TABS: 10.0000 mg | ORAL_TABLET | Freq: Every day | ORAL | Status: DC

## 2012-07-04 MED ORDER — TRIAMCINOLONE ACETONIDE 0.5 % EX CREA: TOPICAL_CREAM | Freq: Three times a day (TID) | CUTANEOUS | Status: DC

## 2012-07-04 NOTE — Assessment & Plan Note (Signed)
5/14 ? etiol Triamc cream Loratidine Depomerol 80 mg IM

## 2012-07-04 NOTE — Progress Notes (Signed)
Patient ID: Ricardo [name], male   DOB: 1950-08-12, 62 y.o.   MRN: [id number]   Subjective:    Rash This is a new problem. The current episode started in the past 7 days. The problem has been gradually worsening since onset. The affected locations include the chest, torso, left arm and right arm. He was exposed to nothing. Pertinent negatives include no congestion, cough, fatigue or sore throat. The treatment provided no relief. His past medical history is significant for allergies.     The patient presents for a follow-up of  chronic hypertension, chronic dyslipidemia, type 2 diabetes controlled with medicines  F/u LBP F/u herpes rash off and on on the body (??) in different places - none lately  BP Readings from Last 3 Encounters:  07/04/12 132/98  04/25/12 130/90  01/07/12 140/80   Wt Readings from Last 3 Encounters:  07/04/12 166 lb (75.297 kg)  04/25/12 176 lb (79.833 kg)  01/07/12 173 lb (78.472 kg)     Review of Systems  Constitutional: Negative for appetite change, fatigue and unexpected weight change.  HENT: Negative for nosebleeds, congestion, sore throat, sneezing, trouble swallowing and neck pain.   Eyes: Negative for itching and visual disturbance.  Respiratory: Negative for cough.   Cardiovascular: Negative for chest pain, palpitations and leg swelling.  Gastrointestinal: Negative for blood in stool and abdominal distention.  Genitourinary: Positive for urgency and difficulty urinating.  Musculoskeletal: Positive for back pain and gait problem. Negative for joint swelling.  Skin: Positive for rash.  Neurological: Negative for dizziness, tremors, speech difficulty and weakness.  Psychiatric/Behavioral: Negative for suicidal ideas, sleep disturbance, dysphoric mood and agitation. The patient is not nervous/anxious.        Objective:   Physical Exam  Constitutional: He is oriented to person, place, and time. He appears well-developed.  HENT:  Mouth/Throat:  Oropharynx is clear and moist.  Eyes: Conjunctivae are normal. Pupils are equal, round, and reactive to light.  Neck: Normal range of motion. No JVD present. No thyromegaly present.  Cardiovascular: Normal rate, regular rhythm, normal heart sounds and intact distal pulses.  Exam reveals no gallop and no friction rub.   No murmur heard. Pulmonary/Chest: Effort normal and breath sounds normal. No respiratory distress. He has no wheezes. He has no rales. He exhibits no tenderness.  Abdominal: Soft. Bowel sounds are normal. He exhibits no distension and no mass. There is no tenderness. There is no rebound and no guarding.  Genitourinary: Rectum normal. Guaiac negative stool.  Musculoskeletal: Normal range of motion. He exhibits tenderness. He exhibits no edema.  Lymphadenopathy:    He has no cervical adenopathy.  Neurological: He is alert and oriented to person, place, and time. He has normal reflexes. No cranial nerve deficit. He exhibits normal muscle tone. Coordination normal.  Skin: Skin is warm and dry. Rash noted.  Small 1 mm papules scattered diffusely  Psychiatric: He has a normal mood and affect. His behavior is normal. Judgment and thought content normal.       Assessment & Plan:

## 2012-07-07 ENCOUNTER — Ambulatory Visit (AMBULATORY_SURGERY_CENTER): Payer: PRIVATE HEALTH INSURANCE | Admitting: *Deleted

## 2012-07-07 VITALS — Ht 66.0 in | Wt 170.4 lb

## 2012-07-07 DIAGNOSIS — Z1211 Encounter for screening for malignant neoplasm of colon: Secondary | ICD-10-CM

## 2012-07-07 MED ORDER — NA SULFATE-K SULFATE-MG SULF 17.5-3.13-1.6 GM/177ML PO SOLN: ORAL | Status: DC

## 2012-07-11 ENCOUNTER — Encounter: Payer: Self-pay | Admitting: Gastroenterology

## 2012-07-16 ENCOUNTER — Telehealth: Payer: Self-pay | Admitting: Gastroenterology

## 2012-07-16 NOTE — Telephone Encounter (Signed)
yes

## 2012-07-18 ENCOUNTER — Encounter: Payer: PRIVATE HEALTH INSURANCE | Admitting: Gastroenterology

## 2012-07-24 ENCOUNTER — Other Ambulatory Visit: Payer: Self-pay | Admitting: Gastroenterology

## 2012-07-24 ENCOUNTER — Ambulatory Visit (AMBULATORY_SURGERY_CENTER): Payer: PRIVATE HEALTH INSURANCE | Admitting: Gastroenterology

## 2012-07-24 ENCOUNTER — Encounter: Payer: Self-pay | Admitting: Gastroenterology

## 2012-07-24 VITALS — BP 130/82 | HR 63 | Temp 97.2°F | Resp 18 | Ht 66.0 in | Wt 170.0 lb

## 2012-07-24 DIAGNOSIS — K648 Other hemorrhoids: Secondary | ICD-10-CM

## 2012-07-24 DIAGNOSIS — Z1211 Encounter for screening for malignant neoplasm of colon: Secondary | ICD-10-CM

## 2012-07-24 LAB — GLUCOSE, CAPILLARY: Glucose-Capillary: 140 mg/dL — ABNORMAL HIGH (ref 70–99)

## 2012-07-24 MED ORDER — SODIUM CHLORIDE 0.9 % IV SOLN: 500.0000 mL | INTRAVENOUS | Status: DC

## 2012-07-24 NOTE — Op Note (Signed)
[facility] [address] [place], [address]   COLONOSCOPY PROCEDURE REPORT  PATIENT: [name], Ricardo  MR#: [id number] BIRTHDATE: Feb 22, 1951 , 61  yrs. old GENDER: Male ENDOSCOPIST: Louis Meckel, MD REFERRED XB:JYNW Buckner [place], M.D. PROCEDURE DATE:  07/24/2012 PROCEDURE:   Colonoscopy, diagnostic ASA CLASS:   Class II INDICATIONS:average risk screening. MEDICATIONS: MAC sedation, administered by CRNA and propofol (Diprivan) 200mg  IV  DESCRIPTION OF PROCEDURE:   After the risks benefits and alternatives of the procedure were thoroughly explained, informed consent was obtained.  A digital rectal exam revealed no abnormalities of the rectum.   The LB [id number]  endoscope was introduced through the anus and advanced to the cecum, which was identified by both the appendix and ileocecal valve. No adverse events experienced.   Limited by poor preparation.  There was a moderate amount of retained, liquid and solid stool.  The quality of the prep was Suprep fair  The instrument was then slowly withdrawn as the colon was fully examined.      COLON FINDINGS: Internal hemorrhoids were found.   The colon mucosa was otherwise normal.  Retroflexed views revealed no abnormalities. The time to cecum=5 minutes 07 seconds.  Withdrawal time=7 minutes 14 seconds.  The scope was withdrawn and the procedure completed. COMPLICATIONS: There were no complications.  ENDOSCOPIC IMPRESSION: 1.   Internal hemorrhoids 2.   The colon mucosa was otherwise normal  RECOMMENDATIONS: colonoscopy 1 year due to limitations of prep   eSigned:  Louis Meckel, MD 07/24/2012 9:15 AM   cc:

## 2012-07-24 NOTE — Patient Instructions (Addendum)

## 2012-07-24 NOTE — Progress Notes (Addendum)
Patient did not have preoperative order for IV antibiotic SSI prophylaxis. (G8918)  Patient did not experience any of the following events: a burn prior to discharge; a fall within the facility; wrong site/side/patient/procedure/implant event; or a hospital transfer or hospital admission upon discharge from the facility. (G8907)  

## 2012-07-24 NOTE — Progress Notes (Signed)
Stable to RR 

## 2012-07-25 ENCOUNTER — Telehealth: Payer: Self-pay | Admitting: *Deleted

## 2012-07-25 NOTE — Telephone Encounter (Signed)
  Follow up Call-  Call back number 07/24/2012  Post procedure Call Back phone  # [phone]  Permission to leave phone message Yes     Patient questions:  Do you have a fever, pain , or abdominal swelling? no Pain Score  0 *  Have you tolerated food without any problems? yes  Have you been able to return to your normal activities? yes  Do you have any questions about your discharge instructions: Diet   no Medications  no Follow up visit  no  Do you have questions or concerns about your Care? no  Actions: * If pain score is 4 or above: No action needed, pain <4.

## 2012-09-01 ENCOUNTER — Telehealth: Payer: Self-pay | Admitting: Internal Medicine

## 2012-09-01 MED ORDER — METFORMIN HCL 1000 MG PO TABS: 1000.0000 mg | ORAL_TABLET | Freq: Two times a day (BID) | ORAL | Status: DC

## 2012-09-01 NOTE — Telephone Encounter (Signed)
Pt is in [place], [place].  He has 2 days left of the Metformin HCL 1000.  He wants it called or faxed to [facility] in [place].  The phone number is [phone] the fax is [phone].

## 2012-09-01 NOTE — Telephone Encounter (Signed)
Rf sent to pharmacy requested.

## 2012-10-02 ENCOUNTER — Telehealth: Payer: Self-pay | Admitting: *Deleted

## 2012-10-02 DIAGNOSIS — Z0000 Encounter for general adult medical examination without abnormal findings: Secondary | ICD-10-CM

## 2012-10-02 DIAGNOSIS — Z125 Encounter for screening for malignant neoplasm of prostate: Secondary | ICD-10-CM

## 2012-10-02 DIAGNOSIS — E119 Type 2 diabetes mellitus without complications: Secondary | ICD-10-CM

## 2012-10-02 NOTE — Telephone Encounter (Signed)
Message copied by Deatra James on Thu Oct 02, 2012 11:51 AM ------      Message from: Etheleen Sia      Created: Tue Sep 02, 2012  8:30 AM      Regarding: LAB       PHYSICAL ON SEPT 29 ------

## 2012-10-30 ENCOUNTER — Other Ambulatory Visit: Payer: Self-pay | Admitting: Internal Medicine

## 2012-11-21 ENCOUNTER — Ambulatory Visit (INDEPENDENT_AMBULATORY_CARE_PROVIDER_SITE_OTHER): Payer: PRIVATE HEALTH INSURANCE | Admitting: Internal Medicine

## 2012-11-21 ENCOUNTER — Encounter: Payer: Self-pay | Admitting: Internal Medicine

## 2012-11-21 VITALS — BP 160/100 | HR 74 | Temp 97.4°F | Wt 172.0 lb

## 2012-11-21 DIAGNOSIS — E119 Type 2 diabetes mellitus without complications: Secondary | ICD-10-CM

## 2012-11-21 DIAGNOSIS — M7521 Bicipital tendinitis, right shoulder: Secondary | ICD-10-CM

## 2012-11-21 DIAGNOSIS — M67919 Unspecified disorder of synovium and tendon, unspecified shoulder: Secondary | ICD-10-CM

## 2012-11-21 DIAGNOSIS — M25512 Pain in left shoulder: Secondary | ICD-10-CM

## 2012-11-21 DIAGNOSIS — M7520 Bicipital tendinitis, unspecified shoulder: Secondary | ICD-10-CM

## 2012-11-21 DIAGNOSIS — M25519 Pain in unspecified shoulder: Secondary | ICD-10-CM

## 2012-11-21 DIAGNOSIS — Z0000 Encounter for general adult medical examination without abnormal findings: Secondary | ICD-10-CM

## 2012-11-21 NOTE — Patient Instructions (Signed)
Postprocedure instructions :    A Band-Aid should be left on for 12 hours. Injection therapy is not a cure itself. It is used in conjunction with other modalities. You can use nonsteroidal anti-inflammatories like ibuprofen , hot and cold compresses. Rest is recommended in the next 24 hours. You need to report immediately  if fever, chills or any signs of infection develop. 

## 2012-11-21 NOTE — Assessment & Plan Note (Addendum)
Flared up this week See procedure

## 2012-11-21 NOTE — Progress Notes (Signed)
Subjective:    Shoulder Pain  The pain is present in the left shoulder. This is a recurrent problem. The current episode started 1 to 4 weeks ago. There has been no history of extremity trauma. The problem occurs intermittently. The problem has been gradually worsening. The quality of the pain is described as sharp. The pain is at a severity of 7/10. The pain is severe. Pertinent negatives include no fever. He has tried NSAIDS for the symptoms. Family history does not include gout or rheumatoid arthritis. His past medical history is significant for osteoarthritis.   Shot helped x 6 months  The patient presents for a follow-up of  chronic hypertension, chronic dyslipidemia, type 2 diabetes controlled with medicines  F/u LBP F/u herpes rash off and on on the body (??) in different places - none lately  BP Readings from Last 3 Encounters:  11/21/12 160/100  07/24/12 130/82  07/04/12 132/98   Wt Readings from Last 3 Encounters:  11/21/12 172 lb (78.019 kg)  07/24/12 170 lb (77.111 kg)  07/07/12 170 lb 6.4 oz (77.293 kg)     Review of Systems  Constitutional: Negative for fever, appetite change, fatigue and unexpected weight change.  HENT: Negative for nosebleeds, congestion, sore throat, sneezing, trouble swallowing and neck pain.   Eyes: Negative for itching and visual disturbance.  Respiratory: Negative for cough.   Cardiovascular: Negative for chest pain, palpitations and leg swelling.  Gastrointestinal: Negative for blood in stool and abdominal distention.  Genitourinary: Positive for urgency and difficulty urinating.  Musculoskeletal: Positive for back pain and gait problem. Negative for joint swelling.  Skin: Negative for rash.  Neurological: Negative for dizziness, tremors, speech difficulty and weakness.  Psychiatric/Behavioral: Negative for suicidal ideas, sleep disturbance, dysphoric mood and agitation. The patient is not nervous/anxious.        Objective:   Physical Exam  Constitutional: He is oriented to person, place, and time. He appears well-developed.  HENT:  Mouth/Throat: Oropharynx is clear and moist.  Eyes: Conjunctivae are normal. Pupils are equal, round, and reactive to light.  Neck: Normal range of motion. No JVD present. No thyromegaly present.  Cardiovascular: Normal rate, regular rhythm, normal heart sounds and intact distal pulses.  Exam reveals no gallop and no friction rub.   No murmur heard. Pulmonary/Chest: Effort normal and breath sounds normal. No respiratory distress. He has no wheezes. He has no rales. He exhibits no tenderness.  Abdominal: Soft. Bowel sounds are normal. He exhibits no distension and no mass. There is no tenderness. There is no rebound and no guarding.  Genitourinary: Rectum normal. Guaiac negative stool.  Musculoskeletal: Normal range of motion. He exhibits tenderness. He exhibits no edema.  Lymphadenopathy:    He has no cervical adenopathy.  Neurological: He is alert and oriented to person, place, and time. He has normal reflexes. No cranial nerve deficit. He exhibits normal muscle tone. Coordination normal.  Skin: Skin is warm and dry. No rash noted.  Psychiatric: He has a normal mood and affect. His behavior is normal. Judgment and thought content normal.  L bicipital tendon is tender; L subacr space is tender too   Procedure Note :   Biceps tendon Injection L:   Indication : tendonitis   Risks including unsuccessful procedure , bleeding, infection, bruising, skin atrophy and others were explained to the patient in detail as well as the benefits. Informed consent was obtained and signed.   Tthe patient was placed in a comfortable position.   L bicepse  tendon was marked and  the skin was prepped with Betadine and alcohol. 1 inch 25-gauge needle was used. The needle was advanced and the tendon was injected with 1 mL of 2% lidocaine and 20 mg of Depo-Medrol in a usual fashion.  Band-Aids applied.    Tolerated well. Complications: None. Good pain relief following the procedure.  -----------------------------------------------------------   Procedure :Joint Injection,  L shoulder   Indication:  Subacromial bursitis with refractory  chronic pain.   Risks including unsuccessful procedure , bleeding, infection, bruising, skin atrophy, "steroid flare-up" and others were explained to the patient in detail as well as the benefits. Informed consent was obtained and signed.   Tthe patient was placed in a comfortable position. Lateral approach was used. Skin was prepped with Betadine and alcohol  and anesthetized with a cooling spray. Then, a 5 cc syringe with a 2 inch long 24-gauge needle was used for a joint injection.. The needle was advanced  Into the subacromial space.The bursa was injected with 3 mL of 2% lidocaine and 40 mg of Depo-Medrol .  Band-Aid was applied.   Tolerated well. Complications: None. Good pain relief following the procedure.        Assessment & Plan:

## 2012-11-22 ENCOUNTER — Encounter: Payer: Self-pay | Admitting: Internal Medicine

## 2012-11-22 NOTE — Assessment & Plan Note (Signed)
Continue with current prescription therapy as reflected on the Med list.  

## 2012-11-22 NOTE — Assessment & Plan Note (Signed)
See procedure 

## 2012-11-24 ENCOUNTER — Encounter: Payer: PRIVATE HEALTH INSURANCE | Admitting: Internal Medicine

## 2012-12-23 ENCOUNTER — Other Ambulatory Visit (INDEPENDENT_AMBULATORY_CARE_PROVIDER_SITE_OTHER): Payer: PRIVATE HEALTH INSURANCE

## 2012-12-23 DIAGNOSIS — Z0000 Encounter for general adult medical examination without abnormal findings: Secondary | ICD-10-CM

## 2012-12-23 LAB — CBC WITH DIFFERENTIAL/PLATELET
Eosinophils Absolute: 0.3 10*3/uL (ref 0.0–0.7)
Hemoglobin: 14 g/dL (ref 13.0–17.0)
MCHC: 33.5 g/dL (ref 30.0–36.0)
MCV: 88.4 fl (ref 78.0–100.0)
Monocytes Absolute: 0.6 10*3/uL (ref 0.1–1.0)
Neutrophils Relative %: 60.2 % (ref 43.0–77.0)
Platelets: 217 10*3/uL (ref 150.0–400.0)

## 2012-12-23 LAB — URINALYSIS
Bilirubin Urine: NEGATIVE
Leukocytes, UA: NEGATIVE
Nitrite: NEGATIVE
Specific Gravity, Urine: 1.015 (ref 1.000–1.030)
Total Protein, Urine: NEGATIVE
Urine Glucose: NEGATIVE
pH: 6 (ref 5.0–8.0)

## 2012-12-23 LAB — LIPID PANEL
Cholesterol: 211 mg/dL — ABNORMAL HIGH (ref 0–200)
HDL: 51.3 mg/dL (ref >39.00)
Triglycerides: 200 mg/dL — ABNORMAL HIGH (ref 0.0–149.0)

## 2012-12-23 LAB — BASIC METABOLIC PANEL
BUN: 13 mg/dL (ref 6–23)
CO2: 32 mEq/L (ref 19–32)
Chloride: 103 mEq/L (ref 96–112)
Creatinine, Ser: 1.1 mg/dL (ref 0.4–1.5)
GFR: 70.88 mL/min (ref >60.00)
Glucose, Bld: 145 mg/dL — ABNORMAL HIGH (ref 70–99)
Sodium: 140 mEq/L (ref 135–145)

## 2012-12-23 LAB — HEPATIC FUNCTION PANEL
Bilirubin, Direct: 0.1 mg/dL (ref 0.0–0.3)
Total Bilirubin: 0.8 mg/dL (ref 0.3–1.2)
Total Protein: 6.9 g/dL (ref 6.0–8.3)

## 2012-12-23 LAB — LDL CHOLESTEROL, DIRECT: Direct LDL: 138.1 mg/dL

## 2012-12-23 LAB — PSA: PSA: 2.47 ng/mL (ref 0.10–4.00)

## 2012-12-26 ENCOUNTER — Encounter: Payer: Self-pay | Admitting: Internal Medicine

## 2012-12-26 ENCOUNTER — Ambulatory Visit (INDEPENDENT_AMBULATORY_CARE_PROVIDER_SITE_OTHER): Payer: PRIVATE HEALTH INSURANCE | Admitting: Internal Medicine

## 2012-12-26 VITALS — BP 140/98 | HR 72 | Temp 99.7°F | Ht 66.0 in | Wt 170.4 lb

## 2012-12-26 DIAGNOSIS — M545 Low back pain, unspecified: Secondary | ICD-10-CM

## 2012-12-26 DIAGNOSIS — I10 Essential (primary) hypertension: Secondary | ICD-10-CM

## 2012-12-26 DIAGNOSIS — E119 Type 2 diabetes mellitus without complications: Secondary | ICD-10-CM

## 2012-12-26 DIAGNOSIS — E039 Hypothyroidism, unspecified: Secondary | ICD-10-CM

## 2012-12-26 DIAGNOSIS — Z0000 Encounter for general adult medical examination without abnormal findings: Secondary | ICD-10-CM

## 2012-12-26 MED ORDER — LEVOTHYROXINE SODIUM 100 MCG PO TABS: ORAL_TABLET | ORAL | Status: DC

## 2012-12-26 MED ORDER — LOSARTAN POTASSIUM 100 MG PO TABS: 100.0000 mg | ORAL_TABLET | Freq: Every day | ORAL | Status: DC

## 2012-12-26 NOTE — Assessment & Plan Note (Signed)
Continue with current prn prescription therapy as reflected on the Med list.  

## 2012-12-26 NOTE — Assessment & Plan Note (Signed)
We discussed age appropriate health related issues, including available/recomended screening tests and vaccinations. We discussed a need for adhering to healthy diet and exercise. Labs/EKG were reviewed/ordered. All questions were answered.  Zostavax suggested Colon is done

## 2012-12-26 NOTE — Progress Notes (Signed)
   Subjective:    HPI  The patient is here for a wellness exam. The patient has been doing well overall without major physical or psychological issues going on lately.  F/u LBP off and on  F/u herpes rash off and on on the body (??) in different places  BP Readings from Last 3 Encounters:  12/26/12 140/98  11/21/12 160/100  07/24/12 130/82   Wt Readings from Last 3 Encounters:  12/26/12 170 lb 6 oz (77.282 kg)  11/21/12 172 lb (78.019 kg)  07/24/12 170 lb (77.111 kg)     Review of Systems  Constitutional: Negative for appetite change, fatigue and unexpected weight change.  HENT: Negative for congestion, nosebleeds, sneezing, sore throat and trouble swallowing.   Eyes: Negative for itching and visual disturbance.  Respiratory: Negative for cough.   Cardiovascular: Negative for chest pain, palpitations and leg swelling.  Gastrointestinal: Negative for nausea, diarrhea, blood in stool and abdominal distention.  Genitourinary: Positive for urgency, frequency and difficulty urinating. Negative for hematuria.  Musculoskeletal: Positive for back pain and gait problem. Negative for joint swelling and neck pain.  Skin: Negative for rash.  Neurological: Negative for dizziness, tremors, speech difficulty and weakness.  Psychiatric/Behavioral: Negative for suicidal ideas, sleep disturbance, dysphoric mood and agitation. The patient is not nervous/anxious.        Objective:   Physical Exam  Constitutional: He is oriented to person, place, and time. He appears well-developed.  HENT:  Mouth/Throat: Oropharynx is clear and moist.  Eyes: Conjunctivae are normal. Pupils are equal, round, and reactive to light.  Neck: Normal range of motion. No JVD present. No thyromegaly present.  Cardiovascular: Normal rate, regular rhythm, normal heart sounds and intact distal pulses.  Exam reveals no gallop and no friction rub.   No murmur heard. Pulmonary/Chest: Effort normal and breath sounds  normal. No respiratory distress. He has no wheezes. He has no rales. He exhibits no tenderness.  Abdominal: Soft. Bowel sounds are normal. He exhibits no distension and no mass. There is no tenderness. There is no rebound and no guarding.  Genitourinary: Rectum normal. Guaiac negative stool.  Prostate is 1+ NT  Musculoskeletal: Normal range of motion. He exhibits tenderness (LS is stiff and tender). He exhibits no edema.  Lymphadenopathy:    He has no cervical adenopathy.  Neurological: He is alert and oriented to person, place, and time. He has normal reflexes. No cranial nerve deficit. He exhibits normal muscle tone. Coordination normal.  Skin: Skin is warm and dry. No rash noted.  Psychiatric: He has a normal mood and affect. His behavior is normal. Judgment and thought content normal.    Lab Results  Component Value Date   WBC 6.5 12/23/2012   HGB 14.0 12/23/2012   HCT 41.9 12/23/2012   PLT 217.0 12/23/2012   GLUCOSE 145* 12/23/2012   CHOL 211* 12/23/2012   TRIG 200.0* 12/23/2012   HDL 51.30 12/23/2012   LDLDIRECT 138.1 12/23/2012   LDLCALC 96 10/26/2011   ALT 16 12/23/2012   AST 15 12/23/2012   NA 140 12/23/2012   K 5.3* 12/23/2012   CL 103 12/23/2012   CREATININE 1.1 12/23/2012   BUN 13 12/23/2012   CO2 32 12/23/2012   TSH 1.94 12/23/2012   PSA 2.47 12/23/2012   INR 1.0 04/02/2007   HGBA1C 6.9* 10/26/2011   MICROALBUR 0.2 10/18/2010         Assessment & Plan:

## 2012-12-26 NOTE — Assessment & Plan Note (Signed)
Continue with current prescription therapy as reflected on the Med list.  

## 2013-01-30 ENCOUNTER — Other Ambulatory Visit: Payer: Self-pay | Admitting: Internal Medicine

## 2013-03-14 ENCOUNTER — Other Ambulatory Visit: Payer: Self-pay | Admitting: Family Medicine

## 2013-03-24 ENCOUNTER — Other Ambulatory Visit: Payer: Self-pay | Admitting: *Deleted

## 2013-03-24 MED ORDER — ACYCLOVIR 400 MG PO TABS: 400.0000 mg | ORAL_TABLET | Freq: Three times a day (TID) | ORAL | Status: DC

## 2013-04-10 ENCOUNTER — Ambulatory Visit (INDEPENDENT_AMBULATORY_CARE_PROVIDER_SITE_OTHER): Payer: PRIVATE HEALTH INSURANCE | Admitting: Internal Medicine

## 2013-04-10 ENCOUNTER — Encounter: Payer: Self-pay | Admitting: Internal Medicine

## 2013-04-10 VITALS — BP 150/85 | HR 80 | Temp 96.8°F | Resp 16 | Wt 176.0 lb

## 2013-04-10 DIAGNOSIS — M545 Low back pain, unspecified: Secondary | ICD-10-CM

## 2013-04-10 DIAGNOSIS — I10 Essential (primary) hypertension: Secondary | ICD-10-CM

## 2013-04-10 DIAGNOSIS — E119 Type 2 diabetes mellitus without complications: Secondary | ICD-10-CM

## 2013-04-10 MED ORDER — LOSARTAN POTASSIUM 100 MG PO TABS: 100.0000 mg | ORAL_TABLET | Freq: Every day | ORAL | Status: DC

## 2013-04-10 NOTE — Progress Notes (Signed)
Pre visit review using our clinic review tool, if applicable. No additional management support is needed unless otherwise documented below in the visit note. 

## 2013-04-10 NOTE — Assessment & Plan Note (Signed)
Labs today He is wondering if he should use other DM meds

## 2013-04-10 NOTE — Assessment & Plan Note (Signed)
White coat HTN

## 2013-04-10 NOTE — Assessment & Plan Note (Signed)
Continue with current prescription therapy as reflected on the Med list.  

## 2013-04-10 NOTE — Progress Notes (Signed)
   Subjective:    HPI  C/o elevated BP at [facility] (he presented there for teeth cleaning). SBP was 180. Nl BP at home  F/u DM and LBP off and on  F/u herpes rash off and on on the body (??) in different places  BP Readings from Last 3 Encounters:  04/10/13 150/85  12/26/12 140/98  11/21/12 160/100   Wt Readings from Last 3 Encounters:  04/10/13 176 lb (79.833 kg)  12/26/12 170 lb 6 oz (77.282 kg)  11/21/12 172 lb (78.019 kg)     Review of Systems  Constitutional: Negative for appetite change, fatigue and unexpected weight change.  HENT: Negative for congestion, nosebleeds, sneezing, sore throat and trouble swallowing.   Eyes: Negative for itching and visual disturbance.  Respiratory: Negative for cough.   Cardiovascular: Negative for chest pain, palpitations and leg swelling.  Gastrointestinal: Negative for nausea, diarrhea, blood in stool and abdominal distention.  Genitourinary: Positive for urgency, frequency and difficulty urinating. Negative for hematuria.  Musculoskeletal: Positive for back pain and gait problem. Negative for joint swelling and neck pain.  Skin: Negative for rash.  Neurological: Negative for dizziness, tremors, speech difficulty and weakness.  Psychiatric/Behavioral: Negative for suicidal ideas, sleep disturbance, dysphoric mood and agitation. The patient is not nervous/anxious.        Objective:   Physical Exam  Constitutional: He is oriented to person, place, and time. He appears well-developed.  HENT:  Mouth/Throat: Oropharynx is clear and moist.  Eyes: Conjunctivae are normal. Pupils are equal, round, and reactive to light.  Neck: Normal range of motion. No JVD present. No thyromegaly present.  Cardiovascular: Normal rate, regular rhythm, normal heart sounds and intact distal pulses.  Exam reveals no gallop and no friction rub.   No murmur heard. Pulmonary/Chest: Effort normal and breath sounds normal. No respiratory distress. He has no  wheezes. He has no rales. He exhibits no tenderness.  Abdominal: Soft. Bowel sounds are normal. He exhibits no distension and no mass. There is no tenderness. There is no rebound and no guarding.  Genitourinary: Rectum normal. Guaiac negative stool.  Prostate is 1+ NT  Musculoskeletal: Normal range of motion. He exhibits tenderness (LS is stiff and tender). He exhibits no edema.  Lymphadenopathy:    He has no cervical adenopathy.  Neurological: He is alert and oriented to person, place, and time. He has normal reflexes. No cranial nerve deficit. He exhibits normal muscle tone. Coordination normal.  Skin: Skin is warm and dry. No rash noted.  Psychiatric: He has a normal mood and affect. His behavior is normal. Judgment and thought content normal.    Lab Results  Component Value Date   WBC 6.5 12/23/2012   HGB 14.0 12/23/2012   HCT 41.9 12/23/2012   PLT 217.0 12/23/2012   GLUCOSE 145* 12/23/2012   CHOL 211* 12/23/2012   TRIG 200.0* 12/23/2012   HDL 51.30 12/23/2012   LDLDIRECT 138.1 12/23/2012   LDLCALC 96 10/26/2011   ALT 16 12/23/2012   AST 15 12/23/2012   NA 140 12/23/2012   K 5.3* 12/23/2012   CL 103 12/23/2012   CREATININE 1.1 12/23/2012   BUN 13 12/23/2012   CO2 32 12/23/2012   TSH 1.94 12/23/2012   PSA 2.47 12/23/2012   INR 1.0 04/02/2007   HGBA1C 6.9* 10/26/2011   MICROALBUR 0.2 10/18/2010         Assessment & Plan:

## 2013-04-17 ENCOUNTER — Telehealth: Payer: Self-pay

## 2013-04-17 NOTE — Telephone Encounter (Signed)
Emmi education sent through the mail

## 2013-05-14 ENCOUNTER — Encounter: Payer: Self-pay | Admitting: Gastroenterology

## 2013-08-27 ENCOUNTER — Telehealth: Payer: Self-pay | Admitting: *Deleted

## 2013-08-27 DIAGNOSIS — E119 Type 2 diabetes mellitus without complications: Secondary | ICD-10-CM

## 2013-08-27 NOTE — Telephone Encounter (Signed)
Left message on machine for patient to schedule a follow up appointment.    A1c ordered.

## 2013-10-13 ENCOUNTER — Other Ambulatory Visit: Payer: Self-pay | Admitting: Internal Medicine

## 2013-12-04 LAB — HM DIABETES EYE EXAM

## 2013-12-15 ENCOUNTER — Encounter: Payer: Self-pay | Admitting: Internal Medicine

## 2013-12-30 ENCOUNTER — Ambulatory Visit (INDEPENDENT_AMBULATORY_CARE_PROVIDER_SITE_OTHER): Payer: PRIVATE HEALTH INSURANCE | Admitting: Internal Medicine

## 2013-12-30 ENCOUNTER — Encounter: Payer: Self-pay | Admitting: Internal Medicine

## 2013-12-30 ENCOUNTER — Other Ambulatory Visit (INDEPENDENT_AMBULATORY_CARE_PROVIDER_SITE_OTHER): Payer: PRIVATE HEALTH INSURANCE

## 2013-12-30 ENCOUNTER — Encounter: Payer: PRIVATE HEALTH INSURANCE | Admitting: Internal Medicine

## 2013-12-30 VITALS — BP 140/80 | HR 81 | Temp 98.7°F | Ht 65.5 in | Wt 170.0 lb

## 2013-12-30 DIAGNOSIS — E034 Atrophy of thyroid (acquired): Secondary | ICD-10-CM

## 2013-12-30 DIAGNOSIS — Z0000 Encounter for general adult medical examination without abnormal findings: Secondary | ICD-10-CM

## 2013-12-30 DIAGNOSIS — I10 Essential (primary) hypertension: Secondary | ICD-10-CM

## 2013-12-30 DIAGNOSIS — E119 Type 2 diabetes mellitus without complications: Secondary | ICD-10-CM

## 2013-12-30 DIAGNOSIS — E038 Other specified hypothyroidism: Secondary | ICD-10-CM

## 2013-12-30 LAB — URINALYSIS
Bilirubin Urine: NEGATIVE
Hgb urine dipstick: NEGATIVE
KETONES UR: NEGATIVE
Leukocytes, UA: NEGATIVE
Nitrite: NEGATIVE
PH: 6 (ref 5.0–8.0)
Total Protein, Urine: NEGATIVE
URINE GLUCOSE: NEGATIVE
Urobilinogen, UA: 0.2 (ref 0.0–1.0)

## 2013-12-30 NOTE — Assessment & Plan Note (Signed)
Continue with current prescription therapy as reflected on the Med list.  

## 2013-12-30 NOTE — Progress Notes (Signed)
Patient ID: Ricardo [name], male   DOB: [birth year], 63 y.o.   MRN: [id number]   Subjective:    HPI  The patient is here for a wellness exam. The patient has been doing well overall without major physical or psychological issues going on lately.  F/u LBP off and on  F/u herpes rash off and on on the body (??) in different places  BP Readings from Last 3 Encounters:  12/30/13 140/80  04/10/13 150/85  12/26/12 140/98   Wt Readings from Last 3 Encounters:  12/30/13 170 lb (77.111 kg)  04/10/13 176 lb (79.833 kg)  12/26/12 170 lb 6 oz (77.282 kg)     Review of Systems  Constitutional: Negative for appetite change, fatigue and unexpected weight change.  HENT: Negative for congestion, nosebleeds, sneezing, sore throat and trouble swallowing.   Eyes: Negative for itching and visual disturbance.  Respiratory: Negative for cough.   Cardiovascular: Negative for chest pain, palpitations and leg swelling.  Gastrointestinal: Negative for nausea, diarrhea, blood in stool and abdominal distention.  Genitourinary: Positive for urgency, frequency and difficulty urinating. Negative for hematuria.  Musculoskeletal: Positive for back pain and gait problem. Negative for joint swelling and neck pain.  Skin: Negative for rash.  Neurological: Negative for dizziness, tremors, speech difficulty and weakness.  Psychiatric/Behavioral: Negative for suicidal ideas, sleep disturbance, dysphoric mood and agitation. The patient is not nervous/anxious.        Objective:   Physical Exam  Lab Results  Component Value Date   WBC 6.5 12/23/2012   HGB 14.0 12/23/2012   HCT 41.9 12/23/2012   PLT 217.0 12/23/2012   GLUCOSE 145* 12/23/2012   CHOL 211* 12/23/2012   TRIG 200.0* 12/23/2012   HDL 51.30 12/23/2012   LDLDIRECT 138.1 12/23/2012   LDLCALC 96 10/26/2011   ALT 16 12/23/2012   AST 15 12/23/2012   NA 140 12/23/2012   K 5.3* 12/23/2012   CL 103 12/23/2012   CREATININE 1.1 12/23/2012   BUN 13  12/23/2012   CO2 32 12/23/2012   TSH 1.94 12/23/2012   PSA 2.47 12/23/2012   INR 1.0 04/02/2007   HGBA1C 6.9* 10/26/2011   MICROALBUR 0.2 10/18/2010         Assessment & Plan:

## 2013-12-30 NOTE — Progress Notes (Signed)
Pre visit review using our clinic review tool, if applicable. No additional management support is needed unless otherwise documented below in the visit note. 

## 2013-12-30 NOTE — Assessment & Plan Note (Signed)
We discussed age appropriate health related issues, including available/recomended screening tests and vaccinations. We discussed a need for adhering to healthy diet and exercise. Labs/EKG were reviewed/ordered. All questions were answered.  Zostavax suggested Colon is due 2024

## 2013-12-31 ENCOUNTER — Other Ambulatory Visit (INDEPENDENT_AMBULATORY_CARE_PROVIDER_SITE_OTHER): Payer: PRIVATE HEALTH INSURANCE

## 2013-12-31 ENCOUNTER — Other Ambulatory Visit: Payer: Self-pay | Admitting: Internal Medicine

## 2013-12-31 DIAGNOSIS — Z0000 Encounter for general adult medical examination without abnormal findings: Secondary | ICD-10-CM

## 2013-12-31 DIAGNOSIS — E1165 Type 2 diabetes mellitus with hyperglycemia: Principal | ICD-10-CM

## 2013-12-31 DIAGNOSIS — IMO0002 Reserved for concepts with insufficient information to code with codable children: Secondary | ICD-10-CM

## 2013-12-31 DIAGNOSIS — E1129 Type 2 diabetes mellitus with other diabetic kidney complication: Secondary | ICD-10-CM

## 2013-12-31 DIAGNOSIS — E119 Type 2 diabetes mellitus without complications: Secondary | ICD-10-CM

## 2013-12-31 LAB — CBC WITH DIFFERENTIAL/PLATELET
BASOS ABS: 0.1 10*3/uL (ref 0.0–0.1)
Basophils Relative: 0.9 % (ref 0.0–3.0)
EOS ABS: 0.5 10*3/uL (ref 0.0–0.7)
Eosinophils Relative: 6.7 % — ABNORMAL HIGH (ref 0.0–5.0)
HEMATOCRIT: 42.4 % (ref 39.0–52.0)
Hemoglobin: 13.9 g/dL (ref 13.0–17.0)
LYMPHS ABS: 1.8 10*3/uL (ref 0.7–4.0)
Lymphocytes Relative: 24.2 % (ref 12.0–46.0)
MCHC: 32.7 g/dL (ref 30.0–36.0)
MCV: 90.2 fl (ref 78.0–100.0)
MONO ABS: 0.8 10*3/uL (ref 0.1–1.0)
MONOS PCT: 11 % (ref 3.0–12.0)
Neutro Abs: 4.2 10*3/uL (ref 1.4–7.7)
Neutrophils Relative %: 57.2 % (ref 43.0–77.0)
PLATELETS: 237 10*3/uL (ref 150.0–400.0)
RBC: 4.7 Mil/uL (ref 4.22–5.81)
RDW: 13.1 % (ref 11.5–15.5)
WBC: 7.3 10*3/uL (ref 4.0–10.5)

## 2013-12-31 LAB — LIPID PANEL
CHOLESTEROL: 176 mg/dL (ref 0–200)
HDL: 39.5 mg/dL (ref >39.00)
LDL Cholesterol: 120 mg/dL — ABNORMAL HIGH (ref 0–99)
NonHDL: 136.5
Total CHOL/HDL Ratio: 4
Triglycerides: 82 mg/dL (ref 0.0–149.0)
VLDL: 16.4 mg/dL (ref 0.0–40.0)

## 2013-12-31 LAB — HEPATIC FUNCTION PANEL
ALT: 20 U/L (ref 0–53)
AST: 18 U/L (ref 0–37)
Albumin: 3.7 g/dL (ref 3.5–5.2)
Alkaline Phosphatase: 35 U/L — ABNORMAL LOW (ref 39–117)
BILIRUBIN DIRECT: 0.2 mg/dL (ref 0.0–0.3)
TOTAL PROTEIN: 6.9 g/dL (ref 6.0–8.3)
Total Bilirubin: 1 mg/dL (ref 0.2–1.2)

## 2013-12-31 LAB — BASIC METABOLIC PANEL
BUN: 14 mg/dL (ref 6–23)
CO2: 29 mEq/L (ref 19–32)
Calcium: 9.5 mg/dL (ref 8.4–10.5)
Chloride: 101 mEq/L (ref 96–112)
Creatinine, Ser: 1.1 mg/dL (ref 0.4–1.5)
GFR: 75.27 mL/min (ref >60.00)
GLUCOSE: 142 mg/dL — AB (ref 70–99)
POTASSIUM: 4.5 meq/L (ref 3.5–5.1)
Sodium: 139 mEq/L (ref 135–145)

## 2013-12-31 LAB — TSH: TSH: 2.2 u[IU]/mL (ref 0.35–4.50)

## 2013-12-31 LAB — HEMOGLOBIN A1C: Hgb A1c MFr Bld: 7.5 % — ABNORMAL HIGH (ref 4.6–6.5)

## 2013-12-31 LAB — PSA: PSA: 2.32 ng/mL (ref 0.10–4.00)

## 2013-12-31 MED ORDER — PIOGLITAZONE HCL 15 MG PO TABS: 15.0000 mg | ORAL_TABLET | Freq: Every day | ORAL | Status: DC

## 2014-01-04 ENCOUNTER — Other Ambulatory Visit: Payer: Self-pay | Admitting: Internal Medicine

## 2014-01-08 ENCOUNTER — Ambulatory Visit: Payer: PRIVATE HEALTH INSURANCE | Admitting: Endocrinology

## 2014-01-18 ENCOUNTER — Encounter: Payer: Self-pay | Admitting: Endocrinology

## 2014-01-18 ENCOUNTER — Ambulatory Visit (INDEPENDENT_AMBULATORY_CARE_PROVIDER_SITE_OTHER): Payer: PRIVATE HEALTH INSURANCE | Admitting: Endocrinology

## 2014-01-18 VITALS — BP 142/88 | HR 77 | Temp 98.1°F | Resp 16 | Ht 65.5 in | Wt 167.8 lb

## 2014-01-18 DIAGNOSIS — I10 Essential (primary) hypertension: Secondary | ICD-10-CM

## 2014-01-18 DIAGNOSIS — E1165 Type 2 diabetes mellitus with hyperglycemia: Secondary | ICD-10-CM

## 2014-01-18 DIAGNOSIS — IMO0002 Reserved for concepts with insufficient information to code with codable children: Secondary | ICD-10-CM

## 2014-01-18 DIAGNOSIS — E782 Mixed hyperlipidemia: Secondary | ICD-10-CM

## 2014-01-18 LAB — MICROALBUMIN / CREATININE URINE RATIO
CREATININE, U: 49.4 mg/dL
MICROALB UR: 0.2 mg/dL (ref 0.0–1.9)
Microalb Creat Ratio: 0.4 mg/g (ref 0.0–30.0)

## 2014-01-18 NOTE — Progress Notes (Signed)
Patient ID: Ricardo [name], male   DOB: May 09, 1950, 63 y.o.   MRN: [id number]           Reason for Appointment: Consultation for Type 2 Diabetes  Referring physician:  [name]   History of Present Illness:          Diagnosis: Type 2 diabetes mellitus, date of diagnosis: 2009       Past history: At diagnosis he was having symptoms of fatigue.  He was started on glipizide initially and subsequently metformin was added.  Further details are not available However he has been somewhat irregular with his follow-up and did not have an A1c with his PCP between 8/13 and 11/15  Recent history:  He was seen by his PCP recently and because of relatively higher A1c he was advised to take Actos 15 mg daily in addition to his current regimen.  However he was concerned about possible connection of Actos with bladder cancer and is here for further information and advice.  He did not bring his blood sugar record today He does use a generic monitor which he thinks is  accurate.  Checking blood sugars mostly in the morning. He thinks that his blood sugars maybe occasionally over 200 after meals but is checking these rarely He feels that over the last few months he has been very noncompliant with his diet especially with eating sweets More recently he has tried to improve his diet.  He thinks he has basic information on meal planning Has not been exercising even though he has less issues with musculoskeletal problems recently       Oral hypoglycemic drugs the patient is taking are: Actos, glipizide ER 20 mg, metformin 1 g twice a day      Side effects from medications have been: None Compliance with the medical regimen: Poor Hypoglycemia: Rarely    Glucose monitoring:  done one time a day         Glucometer:  Generic .      Blood Glucose readings by recall   PREMEAL Breakfast Lunch  PC  Bedtime  Overall   Glucose range: 130-165   up to 120     Median:         Self-care: The diet that the patient  has been following is: none, was eating more sweets and portions    Meals: 3 meals per day. Breakfast is oatmeal/cereal/eggs and toast.  Has sandwich at lunch and chicken or pizza at dinner           Exercise: none         Dietician visit, most recent: at diagnosis               Weight history: 163-191  Wt Readings from Last 3 Encounters:  01/18/14 167 lb 12.8 oz (76.114 kg)  12/30/13 170 lb (77.111 kg)  04/10/13 176 lb (79.833 kg)    Glycemic control:   Lab Results  Component Value Date   HGBA1C 7.5* 12/31/2013   HGBA1C 6.9* 10/26/2011   HGBA1C 7.2* 06/05/2011   Lab Results  Component Value Date   MICROALBUR 0.2 10/18/2010   LDLCALC 120* 12/31/2013   CREATININE 1.1 12/31/2013       Medication List       This list is accurate as of: 01/18/14  9:32 AM.  Always use your most recent med list.               acyclovir 400 MG tablet  Commonly known as:  ZOVIRAX  Take 1 tablet (400 mg total) by mouth 3 (three) times daily.     aspirin 81 MG tablet  Take 81 mg by mouth daily.     atorvastatin 20 MG tablet  Commonly known as:  LIPITOR  TAKE 1 TABLET BY MOUTH ONCE DAILY     glipiZIDE 10 MG 24 hr tablet  Commonly known as:  GLUCOTROL XL  TAKE 2 TABLETS BY MOUTH EVERY DAY     glucose blood test strip  Commonly known as:  ONE TOUCH ULTRA TEST  Use as instructed     levothyroxine 100 MCG tablet  Commonly known as:  SYNTHROID, LEVOTHROID  TAKE 1 TABLET BY MOUTH ONCE DAILY     losartan 100 MG tablet  Commonly known as:  COZAAR  Take 1 tablet (100 mg total) by mouth daily.     metFORMIN 1000 MG tablet  Commonly known as:  GLUCOPHAGE  TAKE 1 TABLET TWICE DAILY     pioglitazone 15 MG tablet  Commonly known as:  ACTOS  Take 1 tablet (15 mg total) by mouth daily.     triamcinolone cream 0.5 %  Commonly known as:  KENALOG  Apply topically 3 (three) times daily.        Allergies: No Known Allergies  Past Medical History  Diagnosis Date  . Hypertension     . Depression   . Diabetes mellitus   . Hyperlipemia   . Fatigue   . Degenerative disc disease, lumbar   . Epicondylitis     bilateral  . ARTHRITIS, HIP 08/10/2008  . BURSITIS, RIGHT SHOULDER 05/20/2008  . Degeneration of lumbar or lumbosacral intervertebral disc 11/27/2006  . DEGENERATION, CERVICAL DISC 11/27/2006  . DEPRESSION 11/25/2006  . DIABETES MELLITUS, TYPE II 04/10/2007  . HERPES GENITALIS 09/17/2007  . 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 HYPERLIPIDEMIA 03/31/2007  . HYPERTENSION 11/25/2006  . HYPOTHYROIDISM 10/28/2007  . Impaired fasting glucose 12/26/2006  . Lateral epicondylitis  of elbow 05/05/2007  . Sciatica 10/28/2007  . SHOULDER PAIN, RIGHT 08/18/2008    Past Surgical History  Procedure Laterality Date  . Herniated lumbar disc  1997, 2011  . Elbow surgery  2005    left  . Knee arthroscopy  2007    left  . Rotator cuff repair  1997    right  . Achilles tendon repair      left    Family History  Problem Relation Age of Onset  . Diabetes Mother   . Heart disease Mother   . Hypertension Mother   . Heart disease Father   . Colon cancer Neg Hx     Social History:  reports that he has never smoked. He has never used smokeless tobacco. He reports that he drinks about 1.8 oz of alcohol per week. He reports that he does not use illicit drugs.    Review of Systems       Vision is normal. Most recent eye exam was 9/15       Lipids: He has been treated with Lipitor 20 mg but LDL is not at target.  He thinks this is partly from being off his diet and also occasionally forgetting to Lipitor      Lab Results  Component Value Date   CHOL 176 12/31/2013   HDL 39.50 12/31/2013   LDLCALC 120* 12/31/2013   LDLDIRECT 138.1 12/23/2012   TRIG 82.0 12/31/2013   CHOLHDL 4 12/31/2013  Skin: No rash or infections     Thyroid:  No  unusual fatigue.  He has been on Synthroid for the last few years, initially had symptoms of fatigue at diagnosis  Lab Results  Component Value  Date   TSH 2.20 12/31/2013   TSH 1.94 12/23/2012   TSH 1.88 10/26/2011       The blood pressure has been checked at home, recent readings 120s/70s-84.  Generally higher when he goes to the doctor's office.  Has been treated with losartan 100 mg     No swelling of feet.     No shortness of breath or chest tightness  on exertion.     No history of coronary artery disease     Bowel habits: Normal.      No joint  pains. Back surgery previously, less symptoms now.  Occasionally has left knee pain also         No history of Numbness, tingling or burning in feet except some numbness in the right foot which is a residual from his sciatica    He does not report any erectile dysfunction   LABS:  No visits with results within 1 Week(s) from this visit. Latest known visit with results is:  Appointment on 12/31/2013  Component Date Value Ref Range Status  . Total Bilirubin 12/31/2013 1.0  0.2 - 1.2 mg/dL Final  . Bilirubin, Direct 12/31/2013 0.2  0.0 - 0.3 mg/dL Final  . Alkaline Phosphatase 12/31/2013 35* 39 - 117 U/L Final  . AST 12/31/2013 18  0 - 37 U/L Final  . ALT 12/31/2013 20  0 - 53 U/L Final  . Total Protein 12/31/2013 6.9  6.0 - 8.3 g/dL Final  . Albumin 12/31/2013 3.7  3.5 - 5.2 g/dL Final  . Hgb A1c MFr Bld 12/31/2013 7.5* 4.6 - 6.5 % Final   Glycemic Control Guidelines for People with Diabetes:Non Diabetic:  <6%Goal of Therapy: <7%Additional Action Suggested:  >8%   . Sodium 12/31/2013 139  135 - 145 mEq/L Final  . Potassium 12/31/2013 4.5  3.5 - 5.1 mEq/L Final  . Chloride 12/31/2013 101  96 - 112 mEq/L Final  . CO2 12/31/2013 29  19 - 32 mEq/L Final  . Glucose, Bld 12/31/2013 142* 70 - 99 mg/dL Final  . BUN 12/31/2013 14  6 - 23 mg/dL Final  . Creatinine, Ser 12/31/2013 1.1  0.4 - 1.5 mg/dL Final  . Calcium 12/31/2013 9.5  8.4 - 10.5 mg/dL Final  . GFR 12/31/2013 75.27  >60.00 mL/min Final  . WBC 12/31/2013 7.3  4.0 - 10.5 K/uL Final  . RBC 12/31/2013 4.70  4.22 -  5.81 Mil/uL Final  . Hemoglobin 12/31/2013 13.9  13.0 - 17.0 g/dL Final  . HCT 12/31/2013 42.4  39.0 - 52.0 % Final  . MCV 12/31/2013 90.2  78.0 - 100.0 fl Final  . MCHC 12/31/2013 32.7  30.0 - 36.0 g/dL Final  . RDW 12/31/2013 13.1  11.5 - 15.5 % Final  . Platelets 12/31/2013 237.0  150.0 - 400.0 K/uL Final  . Neutrophils Relative % 12/31/2013 57.2  43.0 - 77.0 % Final  . Lymphocytes Relative 12/31/2013 24.2  12.0 - 46.0 % Final  . Monocytes Relative 12/31/2013 11.0  3.0 - 12.0 % Final  . Eosinophils Relative 12/31/2013 6.7* 0.0 - 5.0 % Final  . Basophils Relative 12/31/2013 0.9  0.0 - 3.0 % Final  . Neutro Abs 12/31/2013 4.2  1.4 - 7.7 K/uL Final  . Lymphs Abs  12/31/2013 1.8  0.7 - 4.0 K/uL Final  . Monocytes Absolute 12/31/2013 0.8  0.1 - 1.0 K/uL Final  . Eosinophils Absolute 12/31/2013 0.5  0.0 - 0.7 K/uL Final  . Basophils Absolute 12/31/2013 0.1  0.0 - 0.1 K/uL Final  . Cholesterol 12/31/2013 176  0 - 200 mg/dL Final   ATP III Classification       Desirable:  < 200 mg/dL               Borderline High:  200 - 239 mg/dL          High:  > = 240 mg/dL  . Triglycerides 12/31/2013 82.0  0.0 - 149.0 mg/dL Final   Normal:  <150 mg/dLBorderline High:  150 - 199 mg/dL  . HDL 12/31/2013 39.50  >39.00 mg/dL Final  . VLDL 12/31/2013 16.4  0.0 - 40.0 mg/dL Final  . LDL Cholesterol 12/31/2013 120* 0 - 99 mg/dL Final  . Total CHOL/HDL Ratio 12/31/2013 4   Final                  Men          Women1/2 Average Risk     3.4          3.3Average Risk          5.0          4.42X Average Risk          9.6          7.13X Average Risk          15.0          11.0                      . NonHDL 12/31/2013 136.50   Final   NOTE:  Non-HDL goal should be 30 mg/dL higher than patient's LDL goal (i.e. LDL goal of < 70 mg/dL, would have non-HDL goal of < 100 mg/dL)  . PSA 12/31/2013 2.32  0.10 - 4.00 ng/mL Final  . TSH 12/31/2013 2.20  0.35 - 4.50 uIU/mL Final    Physical Examination:  BP 142/88 mmHg  Pulse  77  Temp(Src) 98.1 F (36.7 C)  Resp 16  Ht 5' 5.5" (1.664 m)  Wt 167 lb 12.8 oz (76.114 kg)  BMI 27.49 kg/m2  SpO2 93%  GENERAL:  his well-built and nourished, no significant obesity    HEENT:         Eye exam shows normal external appearance. Fundus exam shows no retinopathy. Oral exam shows normal mucosa .  NECK:         General:  Neck exam shows no lymphadenopathy. Carotids are normal to palpation and no bruit heard.  Thyroid is not enlarged and no nodules felt.   LUNGS:         Chest is symmetrical. Lungs are clear to auscultation.[name]   HEART:         Heart sounds:  S1 and S2 are normal. No murmurs or clicks heard., no S3 or S4.   ABDOMEN:   There is no distention present. Liver and spleen are not palpable. No other mass or tenderness present.  EXTREMITIES:     There is no edema. No skin lesions present.[name]  NEUROLOGICAL:   Vibration sense is mildly reduced in toes. Ankle jerks are absent bilaterally.          Diabetic foot exam shows normal monofilament sensation in the toes except slightly decreased in  the right distal  plantar surface, no skin lesions or ulcers on the feet and normal pedal pulses MUSCULOSKELETAL:       There is no enlargement or deformity of the joints. Spine is normal to inspection.[name]   SKIN:       No rash or lesions of concern.        ASSESSMENT:  Diabetes type 2, with inconsistent control  Although his A1c is only mildly increased at 7.5 he appears to have  postprandial hyperglycemia and does not check his blood sugar readings after meals much  He has not had any recent diabetes education and can do better with meal planning Also recently is more motivated to be better compliant with his diet and modify his intake of simple sugars and reduce large portions He does not exercise regularly and he can start doing this as tolerated; discussed importance of doing this consistently Currently his medication regimen consists of metformin and glipizide which most likely will  not control his glucose long-term He has recently been started on Actos and this would be a reasonable choice for improving insulin resistance Discussed that long-term studies do not implicate Actos in any malignancy and it does have other benefits including improved HDL  Complications: None evident but we'll need assessment of his urine microalbumin  HYPERTENSION: This is mild and usually well-controlled, does have element of white coat hypertension  Hyperlipidemia: Although he is on 20 mg Lipitor he is not at target and is partly related to inconsistent diet.  Also may be a candidate for increasing the Lipitor to the maximum dose or adding another agent such as WelChol  Primary hypothyroidism, adequately replaced  PLAN:   Start doing at least half of the blood sugars about 2 hours after meals  Keep a blood sugar diary and bring to each visit  Continue Actos 15 mg, consider increasing the dose if needed  Discussed potential for hypoglycemia when blood sugars are improved and he is more compliant with exercise and diet.  Will need to reduce his glipizide at that time  Advised him about the possibility of using GLP-1 drugs to help him control portions and also glucose but he is unwilling to do this now  He will follow-up in 4 weeks and will review his blood sugar patterns at that time  Check urine albumin today    Denesia Donelan 01/18/2014, 9:32 AM   Note: This office note was prepared with Dragon voice recognition system technology. Any transcriptional errors that result from this process are unintentional.

## 2014-01-18 NOTE — Patient Instructions (Signed)
Please check blood sugars at least half the time about 2 hours after any meal and 2-3 times per week on waking up. Please bring blood sugar diary to each visit  Restart Exercise daily  Add protein to each meal esp in am

## 2014-01-31 ENCOUNTER — Other Ambulatory Visit: Payer: Self-pay | Admitting: Internal Medicine

## 2014-02-12 ENCOUNTER — Other Ambulatory Visit (INDEPENDENT_AMBULATORY_CARE_PROVIDER_SITE_OTHER): Payer: PRIVATE HEALTH INSURANCE

## 2014-02-12 DIAGNOSIS — E1165 Type 2 diabetes mellitus with hyperglycemia: Secondary | ICD-10-CM

## 2014-02-12 DIAGNOSIS — E119 Type 2 diabetes mellitus without complications: Secondary | ICD-10-CM

## 2014-02-12 DIAGNOSIS — IMO0002 Reserved for concepts with insufficient information to code with codable children: Secondary | ICD-10-CM

## 2014-02-12 LAB — GLUCOSE, RANDOM: GLUCOSE: 137 mg/dL — AB (ref 70–99)

## 2014-02-12 LAB — HEMOGLOBIN A1C: Hgb A1c MFr Bld: 7.6 % — ABNORMAL HIGH (ref 4.6–6.5)

## 2014-02-15 LAB — FRUCTOSAMINE: FRUCTOSAMINE: 285 umol/L — AB (ref 190–270)

## 2014-02-17 ENCOUNTER — Ambulatory Visit: Payer: PRIVATE HEALTH INSURANCE | Admitting: Endocrinology

## 2014-02-18 ENCOUNTER — Ambulatory Visit: Payer: PRIVATE HEALTH INSURANCE | Admitting: *Deleted

## 2014-02-23 ENCOUNTER — Telehealth: Payer: Self-pay | Admitting: Endocrinology

## 2014-02-23 NOTE — Telephone Encounter (Signed)
Lm for pt to call back to schedule

## 2014-02-23 NOTE — Telephone Encounter (Signed)
-----   Message from Elayne Snare, MD sent at 02/20/2014 12:52 PM EST ----- Needs appointment

## 2014-02-28 ENCOUNTER — Other Ambulatory Visit: Payer: Self-pay | Admitting: Internal Medicine

## 2014-03-01 ENCOUNTER — Other Ambulatory Visit: Payer: Self-pay

## 2014-03-01 MED ORDER — PIOGLITAZONE HCL 15 MG PO TABS: 15.0000 mg | ORAL_TABLET | Freq: Every day | ORAL | Status: DC

## 2014-03-05 NOTE — Telephone Encounter (Signed)
LM for pt to call back to schedule °

## 2014-03-08 ENCOUNTER — Other Ambulatory Visit: Payer: Self-pay | Admitting: Internal Medicine

## 2014-03-10 ENCOUNTER — Ambulatory Visit: Payer: PRIVATE HEALTH INSURANCE | Admitting: Endocrinology

## 2014-03-11 ENCOUNTER — Encounter: Payer: Self-pay | Admitting: Endocrinology

## 2014-03-11 ENCOUNTER — Ambulatory Visit (INDEPENDENT_AMBULATORY_CARE_PROVIDER_SITE_OTHER): Payer: PRIVATE HEALTH INSURANCE | Admitting: Endocrinology

## 2014-03-11 VITALS — BP 156/97 | HR 73 | Temp 98.1°F | Resp 14 | Ht 65.5 in | Wt 170.6 lb

## 2014-03-11 DIAGNOSIS — E119 Type 2 diabetes mellitus without complications: Secondary | ICD-10-CM

## 2014-03-11 DIAGNOSIS — E1165 Type 2 diabetes mellitus with hyperglycemia: Secondary | ICD-10-CM

## 2014-03-11 DIAGNOSIS — IMO0002 Reserved for concepts with insufficient information to code with codable children: Secondary | ICD-10-CM

## 2014-03-11 LAB — GLUCOSE, POCT (MANUAL RESULT ENTRY): POC GLUCOSE: 146 mg/dL — AB (ref 70–99)

## 2014-03-11 MED ORDER — SITAGLIPTIN PHOSPHATE 100 MG PO TABS: 100.0000 mg | ORAL_TABLET | Freq: Every day | ORAL | Status: DC

## 2014-03-11 NOTE — Progress Notes (Signed)
Patient ID: Ricardo [name], male   DOB: 09-Jul-1950, 64 y.o.   MRN: [id number]           Reason for Appointment: Consultation for Type 2 Diabetes  Referring physician:  [name]   History of Present Illness:          Diagnosis: Type 2 diabetes mellitus, date of diagnosis: 2009       Past history: At diagnosis he was having symptoms of fatigue.  He was started on glipizide initially and subsequently metformin was added.  Further details are not available However he has been somewhat irregular with his follow-up and did not have an A1c with his PCP between 8/13 and 11/15  Recent history:  He was referred here because of rising A1c of 7.5 Although he had been started on Actos by his PCP the patient had questions about the medications He had been reassured that Actos is safe to take and was continued on this along with his glipizide ER 20 mg and metformin maximum dose He has not brought his blood sugar monitor again for download However did not appear to have any improvement in his home blood sugar readings.  Again checking blood sugars mostly in the mornings and he does not remember his blood sugars accurately especially after meals He does think his blood sugars are sometimes over 200 if he eats a lot of carbohydrates like pasta       Oral hypoglycemic drugs the patient is taking are: Actos, glipizide ER 20 mg, metformin 1 g twice a day      Side effects from medications have been: None Compliance with the medical regimen: Fair Hypoglycemia: Rarely    Glucose monitoring:  done one time a day         Glucometer:  ?  One Touch ultra .      Blood Glucose readings by recall  PREMEAL Breakfast Lunch  PC  Bedtime  Overall   Glucose range: 110-160   up to 220     Median: 120-130        Self-care: The diet that the patient has been following is: none, was eating more sweets and portions    Meals: 3 meals per day. Breakfast is oatmeal/cereal/eggs and toast.  Has sandwich at lunch and  chicken or pizza at dinner           Exercise: occ bike        Dietician visit, most recent: at diagnosis               Weight history: 163-191  Wt Readings from Last 3 Encounters:  03/11/14 170 lb 9.6 oz (77.384 kg)  01/18/14 167 lb 12.8 oz (76.114 kg)  12/30/13 170 lb (77.111 kg)    Glycemic control:   Lab Results  Component Value Date   HGBA1C 7.6* 02/12/2014   HGBA1C 7.5* 12/31/2013   HGBA1C 6.9* 10/26/2011   Lab Results  Component Value Date   MICROALBUR 0.2 01/18/2014   LDLCALC 120* 12/31/2013   CREATININE 1.1 12/31/2013       Medication List       This list is accurate as of: 03/11/14  2:07 PM.  Always use your most recent med list.               aspirin 81 MG tablet  Take 81 mg by mouth daily.     atorvastatin 20 MG tablet  Commonly known as:  LIPITOR  TAKE 1 TABLET BY MOUTH ONCE DAILY  glipiZIDE 10 MG 24 hr tablet  Commonly known as:  GLUCOTROL XL  TAKE 2 TABLETS BY MOUTH EVERY DAY     glucose blood test strip  Commonly known as:  ONE TOUCH ULTRA TEST  Use as instructed     levothyroxine 100 MCG tablet  Commonly known as:  SYNTHROID, LEVOTHROID  TAKE 1 TABLET BY MOUTH ONCE DAILY     losartan 100 MG tablet  Commonly known as:  COZAAR  TAKE 1 TABLET BY MOUTH ONCE DAILY     metFORMIN 1000 MG tablet  Commonly known as:  GLUCOPHAGE  TAKE 1 TABLET TWICE DAILY     pioglitazone 15 MG tablet  Commonly known as:  ACTOS  Take 1 tablet (15 mg total) by mouth daily.        Allergies: No Known Allergies  Past Medical History  Diagnosis Date  . Hypertension   . Depression   . Diabetes mellitus   . Hyperlipemia   . Fatigue   . Degenerative disc disease, lumbar   . Epicondylitis     bilateral  . ARTHRITIS, HIP 08/10/2008  . BURSITIS, RIGHT SHOULDER 05/20/2008  . Degeneration of lumbar or lumbosacral intervertebral disc 11/27/2006  . DEGENERATION, CERVICAL DISC 11/27/2006  . DEPRESSION 11/25/2006  . DIABETES MELLITUS, TYPE II 04/10/2007  .  HERPES GENITALIS 09/17/2007  . 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 HYPERLIPIDEMIA 03/31/2007  . HYPERTENSION 11/25/2006  . HYPOTHYROIDISM 10/28/2007  . Impaired fasting glucose 12/26/2006  . Lateral epicondylitis  of elbow 05/05/2007  . Sciatica 10/28/2007  . SHOULDER PAIN, RIGHT 08/18/2008    Past Surgical History  Procedure Laterality Date  . Herniated lumbar disc  1997, 2011  . Elbow surgery  2005    left  . Knee arthroscopy  2007    left  . Rotator cuff repair  1997    right  . Achilles tendon repair      left    Family History  Problem Relation Age of Onset  . Diabetes Mother   . Heart disease Mother   . Hypertension Mother   . Heart disease Father   . Colon cancer Neg Hx   . Diabetes Sister     Social History:  reports that he has never smoked. He has never used smokeless tobacco. He reports that he drinks about 1.8 oz of alcohol per week. He reports that he does not use illicit drugs.    Review of Systems       Vision is normal. Most recent eye exam was 9/15       Lipids: He has been treated with Lipitor 20 mg but LDL is not at target.        Lab Results  Component Value Date   CHOL 176 12/31/2013   HDL 39.50 12/31/2013   LDLCALC 120* 12/31/2013   LDLDIRECT 138.1 12/23/2012   TRIG 82.0 12/31/2013   CHOLHDL 4 12/31/2013                   Thyroid:  No  unusual fatigue.  He has been on Synthroid for the last few years, initially had symptoms of fatigue at diagnosis  Lab Results  Component Value Date   TSH 2.20 12/31/2013   TSH 1.94 12/23/2012   TSH 1.88 10/26/2011   Diabetic foot exam normal in 11/15   LABS:  Office Visit on 03/11/2014  Component Date Value Ref Range Status  . POC Glucose 03/11/2014 146* 70 - 99 mg/dl Final    Physical  Examination:  BP 156/97 mmHg  Pulse 73  Temp(Src) 98.1 F (36.7 C)  Resp 14  Ht 5' 5.5" (1.664 m)  Wt 170 lb 9.6 oz (77.384 kg)  BMI 27.95 kg/m2  SpO2 97%          No pedal edema    ASSESSMENT:  Diabetes type 2, with  inconsistent control  Although his A1c is still high as of last month this was only 6 weeks after starting his Actos However his home blood sugars do not appear to be improving especially when he does not watch his diet Even though he was referred to the dietitian he does not want to do this Also has not started an exercise regimen Since he is not clear if he is benefiting with adding Actos and most of his readings are high after meals he would be given a trial of Januvia.  Discussed how this works and dosage as well as safety and side effects  PLAN:   Start Januvia 100 mg daily  Consider reducing glipizide to 10 mg if blood sugars are significantly lower  Consultation with dietitian to be considered again  Start exercise  Bring blood sugar records on each visit  Consider Invokana if blood sugars are not improved    Ricardo [name] 03/11/2014, 2:07 PM   Note: This office note was prepared with Dragon voice recognition system technology. Any transcriptional errors that result from this process are unintentional.

## 2014-03-11 NOTE — Patient Instructions (Signed)
Stop Actos and take Januvia 1/day  Please check blood sugars at least half the time about 2 hours after any meal and times per week on waking up. Please bring blood sugar monitor to each visit  Exercise daily

## 2014-03-30 ENCOUNTER — Ambulatory Visit: Payer: PRIVATE HEALTH INSURANCE | Admitting: Endocrinology

## 2014-04-19 ENCOUNTER — Other Ambulatory Visit: Payer: PRIVATE HEALTH INSURANCE

## 2014-04-22 ENCOUNTER — Encounter: Payer: Self-pay | Admitting: Gastroenterology

## 2014-04-22 ENCOUNTER — Ambulatory Visit: Payer: PRIVATE HEALTH INSURANCE | Admitting: Endocrinology

## 2014-05-12 ENCOUNTER — Other Ambulatory Visit: Payer: Self-pay | Admitting: Endocrinology

## 2014-05-12 ENCOUNTER — Other Ambulatory Visit (INDEPENDENT_AMBULATORY_CARE_PROVIDER_SITE_OTHER): Payer: PRIVATE HEALTH INSURANCE

## 2014-05-12 ENCOUNTER — Telehealth: Payer: Self-pay | Admitting: Endocrinology

## 2014-05-12 ENCOUNTER — Other Ambulatory Visit: Payer: PRIVATE HEALTH INSURANCE

## 2014-05-12 DIAGNOSIS — E1165 Type 2 diabetes mellitus with hyperglycemia: Secondary | ICD-10-CM

## 2014-05-12 DIAGNOSIS — IMO0002 Reserved for concepts with insufficient information to code with codable children: Secondary | ICD-10-CM

## 2014-05-12 LAB — HEMOGLOBIN A1C: Hgb A1c MFr Bld: 7 % — ABNORMAL HIGH (ref 4.6–6.5)

## 2014-05-12 LAB — LDL CHOLESTEROL, DIRECT: Direct LDL: 126 mg/dL

## 2014-05-12 LAB — GLUCOSE, RANDOM: GLUCOSE: 140 mg/dL — AB (ref 70–99)

## 2014-05-12 NOTE — Telephone Encounter (Signed)
Ordered, please let the lab run this today

## 2014-05-12 NOTE — Telephone Encounter (Signed)
Pt requested to have Hemo A1C drawn. Can you please put in the order for this to be processed. They did already draw the lab. He went to [facility] instead of here.

## 2014-05-12 NOTE — Telephone Encounter (Signed)
Please read message below and advise.  

## 2014-05-13 LAB — FRUCTOSAMINE: Fructosamine: 272 umol/L (ref 0–285)

## 2014-05-17 ENCOUNTER — Encounter: Payer: Self-pay | Admitting: Endocrinology

## 2014-05-17 ENCOUNTER — Ambulatory Visit (INDEPENDENT_AMBULATORY_CARE_PROVIDER_SITE_OTHER): Payer: PRIVATE HEALTH INSURANCE | Admitting: Endocrinology

## 2014-05-17 VITALS — BP 155/95 | HR 93 | Temp 97.9°F | Resp 16 | Ht 65.5 in | Wt 173.2 lb

## 2014-05-17 DIAGNOSIS — E1165 Type 2 diabetes mellitus with hyperglycemia: Secondary | ICD-10-CM

## 2014-05-17 DIAGNOSIS — IMO0002 Reserved for concepts with insufficient information to code with codable children: Secondary | ICD-10-CM

## 2014-05-17 NOTE — Progress Notes (Signed)
Patient ID: Ricardo [name], male   DOB: May 22, 1950, 64 y.o.   MRN: [id number]           Reason for Appointment: Follow-up for Type 2 Diabetes  Referring physician:  [name]   History of Present Illness:          Diagnosis: Type 2 diabetes mellitus, date of diagnosis: 2009       Past history: At diagnosis he was having symptoms of fatigue.  He was started on glipizide initially and subsequently metformin was added.  Further details are not available However he has been somewhat irregular with his follow-up and did not have an A1c with his PCP between 8/13 and 11/15 He was referred here because of rising A1c of 7.5  Recent history:  He had been continued on Actos which was started by his PCP along with his glipizide ER 20 mg and metformin maximum dose His A1c and blood sugars did not appear to be improving at this after taking Actos for 6 weeks; however difficult to assess his home blood sugar patterns as he usually forgets to bring his monitor and does not check readings after meals much He has not brought his blood sugar monitor again for download although he is keeping a record in a diary He does sporadic readings before and after meals and these are quite inconsistent He does have some relatively high readings before and after most of his meals especially breakfast and lunch  He was started on Januvia in 1/16 instead of Actos and he has had some improvement in his blood sugars His A1c is now 7% He has not been consistent with his exercise and has not been motivated to do this. He thinks he is trying to improve his diet a little       Oral hypoglycemic drugs the patient is taking are: glipizide ER 20 mg, metformin 1 g twice a day      Side effects from medications have been: None Compliance with the medical regimen: Fair Hypoglycemia: Rarely    Glucose monitoring:  done one time a day         Glucometer:  ?  One Touch ultra .      Blood Glucose readings   PRE-MEAL Breakfast  Lunch Dinner Bedtime Overall  Glucose range: 104-187 147  variable     Mean/median:        POST-MEAL PC Breakfast PC Lunch PC Dinner  Glucose range: 185, 213, 115 195, 141 159,169  Mean/median:       Self-care:  Meals: 3 meals per day. Breakfast is oatmeal/cereal/eggs and toast.  Has sandwich or soup at lunch       Exercise: off and on using bike        Dietician visit, most recent: at diagnosis               Weight history: 163-191  Wt Readings from Last 3 Encounters:  05/17/14 173 lb 3.2 oz (78.563 kg)  03/11/14 170 lb 9.6 oz (77.384 kg)  01/18/14 167 lb 12.8 oz (76.114 kg)    Glycemic control:   Lab Results  Component Value Date   HGBA1C 7.0* 05/12/2014   HGBA1C 7.6* 02/12/2014   HGBA1C 7.5* 12/31/2013   Lab Results  Component Value Date   MICROALBUR 0.2 01/18/2014   LDLCALC 120* 12/31/2013   CREATININE 1.1 12/31/2013       Medication List       This list is accurate as of: 05/17/14  3:47  PM.  Always use your most recent med list.               aspirin 81 MG tablet  Take 81 mg by mouth daily.     atorvastatin 20 MG tablet  Commonly known as:  LIPITOR  TAKE 1 TABLET BY MOUTH ONCE DAILY     glipiZIDE 10 MG 24 hr tablet  Commonly known as:  GLUCOTROL XL  TAKE 2 TABLETS BY MOUTH EVERY DAY     glucose blood test strip  Commonly known as:  ONE TOUCH ULTRA TEST  Use as instructed     levothyroxine 100 MCG tablet  Commonly known as:  SYNTHROID, LEVOTHROID  TAKE 1 TABLET BY MOUTH ONCE DAILY     losartan 100 MG tablet  Commonly known as:  COZAAR  TAKE 1 TABLET BY MOUTH ONCE DAILY     metFORMIN 1000 MG tablet  Commonly known as:  GLUCOPHAGE  TAKE 1 TABLET TWICE DAILY     pioglitazone 15 MG tablet  Commonly known as:  ACTOS  Take 1 tablet (15 mg total) by mouth daily.     sitaGLIPtin 100 MG tablet  Commonly known as:  JANUVIA  Take 1 tablet (100 mg total) by mouth daily.        Allergies: No Known Allergies  Past Medical History    Diagnosis Date  . Hypertension   . Depression   . Diabetes mellitus   . Hyperlipemia   . Fatigue   . Degenerative disc disease, lumbar   . Epicondylitis     bilateral  . ARTHRITIS, HIP 08/10/2008  . BURSITIS, RIGHT SHOULDER 05/20/2008  . Degeneration of lumbar or lumbosacral intervertebral disc 11/27/2006  . DEGENERATION, CERVICAL DISC 11/27/2006  . DEPRESSION 11/25/2006  . DIABETES MELLITUS, TYPE II 04/10/2007  . HERPES GENITALIS 09/17/2007  . 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 HYPERLIPIDEMIA 03/31/2007  . HYPERTENSION 11/25/2006  . HYPOTHYROIDISM 10/28/2007  . Impaired fasting glucose 12/26/2006  . Lateral epicondylitis  of elbow 05/05/2007  . Sciatica 10/28/2007  . SHOULDER PAIN, RIGHT 08/18/2008    Past Surgical History  Procedure Laterality Date  . Herniated lumbar disc  1997, 2011  . Elbow surgery  2005    left  . Knee arthroscopy  2007    left  . Rotator cuff repair  1997    right  . Achilles tendon repair      left    Family History  Problem Relation Age of Onset  . Diabetes Mother   . Heart disease Mother   . Hypertension Mother   . Heart disease Father   . Colon cancer Neg Hx   . Diabetes Sister     Social History:  reports that he has never smoked. He has never used smokeless tobacco. He reports that he drinks about 1.8 oz of alcohol per week. He reports that he does not use illicit drugs.    Review of Systems        Most recent eye exam was 9/15       Lipids: He has been treated with Lipitor 20 mg but LDL is not at target.  He now says that he will ache this irregularity and sometimes leave it off for a week as he forgets       Lab Results  Component Value Date   CHOL 176 12/31/2013   HDL 39.50 12/31/2013   LDLCALC 120* 12/31/2013   LDLDIRECT 126.0 05/12/2014   TRIG 82.0 12/31/2013   CHOLHDL 4  12/31/2013                   Thyroid:  No  unusual fatigue.  He has been on Synthroid for the last few years, initially had symptoms of fatigue at diagnosis  Lab Results   Component Value Date   TSH 2.20 12/31/2013   TSH 1.94 12/23/2012   TSH 1.88 10/26/2011   Diabetic foot exam normal in 11/15  He thinks he has had some difficulties with memory recently, has not discussed with PCP  LABS:  Appointment on 05/12/2014  Component Date Value Ref Range Status  . Glucose, Bld 05/12/2014 140* 70 - 99 mg/dL Final  . Fructosamine 05/12/2014 272  0 - 285 umol/L Final   Comment: Published reference interval for apparently healthy subjects between age 80 and 44 is 13 - 285 umol/L and in a poorly controlled diabetic population is 228 - 563 umol/L with a mean of 396 umol/L.   [name] Direct LDL 05/12/2014 126.0   Final   Optimal:  <100 mg/dLNear or Above Optimal:  100-129 mg/dLBorderline High:  130-159 mg/dLHigh:  160-189 mg/dLVery High:  >190 mg/dL  . Hgb A1c MFr Bld 05/12/2014 7.0* 4.6 - 6.5 % Final   Glycemic Control Guidelines for People with Diabetes:Non Diabetic:  <6%Goal of Therapy: <7%Additional Action Suggested:  >8%     Physical Examination:  BP 155/95 mmHg  Pulse 93  Temp(Src) 97.9 F (36.6 C)  Resp 16  Ht 5' 5.5" (1.664 m)  Wt 173 lb 3.2 oz (78.563 kg)  BMI 28.37 kg/m2  SpO2 96%             ASSESSMENT:  Diabetes type 2, nonobese. His recent A1c improved at 7% He appears to be doing somewhat better with using Januvia instead of Actos However he has periodic high readings after meals and also some in the morning; difficult to assess these as he did not bring his blood sugar monitor for download Even though he was referred to the dietitian he does not want to do this Also has not started an exercise regimen despite reminders  PLAN:   Continue Januvia 100 mg daily  Restart Actos since it will help with insulin resistance and may improve his fluctuation in blood sugars  Consultation with dietitian to be considered again  Start exercise  Bring blood sugar monitor for download  Consider switching to Invokana if blood sugars are not  improved  Take Lipitor daily  Follow-up with PCP for blood pressure and memory problems    Dennies Coate 05/17/2014, 3:47 PM   Note: This office note was prepared with Dragon voice recognition system technology. Any transcriptional errors that result from this process are unintentional.

## 2014-05-17 NOTE — Patient Instructions (Addendum)
Start exercise regularly  Restart Actos   Please check blood sugars at least half the time about 2 hours after any meal and 3 times per week on waking up. Please bring blood sugar monitor to each visit. Recommended blood sugar levels about 2 hours after meal is 140-180 and on waking up 90-130  Take Lipitor daily

## 2014-05-31 ENCOUNTER — Other Ambulatory Visit: Payer: Self-pay | Admitting: *Deleted

## 2014-05-31 DIAGNOSIS — E1165 Type 2 diabetes mellitus with hyperglycemia: Secondary | ICD-10-CM

## 2014-05-31 DIAGNOSIS — IMO0002 Reserved for concepts with insufficient information to code with codable children: Secondary | ICD-10-CM

## 2014-05-31 MED ORDER — SITAGLIPTIN PHOSPHATE 100 MG PO TABS: 100.0000 mg | ORAL_TABLET | Freq: Every day | ORAL | Status: DC

## 2014-06-01 ENCOUNTER — Telehealth: Payer: Self-pay | Admitting: Internal Medicine

## 2014-06-01 NOTE — Telephone Encounter (Signed)
Patient states that he needs to speak to you regarding something that he found in his medical history. i offered to assist and also to have [facility] call. He refuses and states that he needs to speak directly to you.

## 2014-06-02 ENCOUNTER — Encounter: Payer: Self-pay | Admitting: Internal Medicine

## 2014-06-02 NOTE — Telephone Encounter (Signed)
I called pt Error entry 2010 - removed

## 2014-08-12 ENCOUNTER — Other Ambulatory Visit: Payer: PRIVATE HEALTH INSURANCE

## 2014-08-16 ENCOUNTER — Other Ambulatory Visit (INDEPENDENT_AMBULATORY_CARE_PROVIDER_SITE_OTHER): Payer: PRIVATE HEALTH INSURANCE

## 2014-08-16 DIAGNOSIS — IMO0002 Reserved for concepts with insufficient information to code with codable children: Secondary | ICD-10-CM

## 2014-08-16 DIAGNOSIS — E1165 Type 2 diabetes mellitus with hyperglycemia: Secondary | ICD-10-CM

## 2014-08-16 LAB — COMPREHENSIVE METABOLIC PANEL
ALT: 17 U/L (ref 0–53)
AST: 17 U/L (ref 0–37)
Albumin: 4.4 g/dL (ref 3.5–5.2)
Alkaline Phosphatase: 33 U/L — ABNORMAL LOW (ref 39–117)
BILIRUBIN TOTAL: 0.6 mg/dL (ref 0.2–1.2)
BUN: 17 mg/dL (ref 6–23)
CALCIUM: 9.6 mg/dL (ref 8.4–10.5)
CHLORIDE: 101 meq/L (ref 96–112)
CO2: 29 meq/L (ref 19–32)
Creatinine, Ser: 1.24 mg/dL (ref 0.40–1.50)
GFR: 62.68 mL/min (ref >60.00)
Glucose, Bld: 124 mg/dL — ABNORMAL HIGH (ref 70–99)
POTASSIUM: 4.2 meq/L (ref 3.5–5.1)
SODIUM: 137 meq/L (ref 135–145)
Total Protein: 7.2 g/dL (ref 6.0–8.3)

## 2014-08-16 LAB — HEMOGLOBIN A1C: Hgb A1c MFr Bld: 7 % — ABNORMAL HIGH (ref 4.6–6.5)

## 2014-08-17 ENCOUNTER — Other Ambulatory Visit: Payer: Self-pay | Admitting: Internal Medicine

## 2014-08-17 ENCOUNTER — Ambulatory Visit: Payer: PRIVATE HEALTH INSURANCE | Admitting: Endocrinology

## 2014-08-19 ENCOUNTER — Ambulatory Visit: Payer: PRIVATE HEALTH INSURANCE | Admitting: Endocrinology

## 2014-09-08 ENCOUNTER — Other Ambulatory Visit: Payer: Self-pay | Admitting: Internal Medicine

## 2014-11-26 ENCOUNTER — Ambulatory Visit: Payer: PRIVATE HEALTH INSURANCE | Admitting: Endocrinology

## 2014-11-28 ENCOUNTER — Encounter (HOSPITAL_COMMUNITY): Payer: Self-pay | Admitting: Nurse Practitioner

## 2014-11-28 ENCOUNTER — Emergency Department (HOSPITAL_COMMUNITY): Payer: 59

## 2014-11-28 ENCOUNTER — Emergency Department (HOSPITAL_COMMUNITY)
Admission: EM | Admit: 2014-11-28 | Discharge: 2014-11-28 | Disposition: A | Discharge status: Home / Self Care | Payer: 59 | Attending: Emergency Medicine | Admitting: Emergency Medicine

## 2014-11-28 DIAGNOSIS — Z7982 Long term (current) use of aspirin: Secondary | ICD-10-CM | POA: Insufficient documentation

## 2014-11-28 DIAGNOSIS — E785 Hyperlipidemia, unspecified: Secondary | ICD-10-CM | POA: Diagnosis not present

## 2014-11-28 DIAGNOSIS — Z7984 Long term (current) use of oral hypoglycemic drugs: Secondary | ICD-10-CM | POA: Insufficient documentation

## 2014-11-28 DIAGNOSIS — M436 Torticollis: Secondary | ICD-10-CM | POA: Insufficient documentation

## 2014-11-28 DIAGNOSIS — M5430 Sciatica, unspecified side: Secondary | ICD-10-CM | POA: Insufficient documentation

## 2014-11-28 DIAGNOSIS — Z8619 Personal history of other infectious and parasitic diseases: Secondary | ICD-10-CM | POA: Diagnosis not present

## 2014-11-28 DIAGNOSIS — I10 Essential (primary) hypertension: Secondary | ICD-10-CM | POA: Diagnosis not present

## 2014-11-28 DIAGNOSIS — E119 Type 2 diabetes mellitus without complications: Secondary | ICD-10-CM | POA: Diagnosis not present

## 2014-11-28 DIAGNOSIS — Z8659 Personal history of other mental and behavioral disorders: Secondary | ICD-10-CM | POA: Diagnosis not present

## 2014-11-28 DIAGNOSIS — M542 Cervicalgia: Secondary | ICD-10-CM | POA: Diagnosis present

## 2014-11-28 DIAGNOSIS — E039 Hypothyroidism, unspecified: Secondary | ICD-10-CM | POA: Insufficient documentation

## 2014-11-28 DIAGNOSIS — Z79899 Other long term (current) drug therapy: Secondary | ICD-10-CM | POA: Diagnosis not present

## 2014-11-28 DIAGNOSIS — M1990 Unspecified osteoarthritis, unspecified site: Secondary | ICD-10-CM | POA: Insufficient documentation

## 2014-11-28 MED ORDER — METHOCARBAMOL 500 MG PO TABS: 500.0000 mg | ORAL_TABLET | Freq: Two times a day (BID) | ORAL | Status: DC

## 2014-11-28 MED ORDER — DIAZEPAM 5 MG PO TABS
5.0000 mg | ORAL_TABLET | Freq: Once | ORAL | Status: AC
  Administered 2014-11-28: 5 mg via ORAL
  Filled 2014-11-28: qty 1

## 2014-11-28 MED ORDER — NAPROXEN 500 MG PO TABS: 500.0000 mg | ORAL_TABLET | Freq: Two times a day (BID) | ORAL | Status: DC

## 2014-11-28 MED ORDER — KETOROLAC TROMETHAMINE 30 MG/ML IJ SOLN
30.0000 mg | Freq: Once | INTRAMUSCULAR | Status: AC
  Administered 2014-11-28: 30 mg via INTRAMUSCULAR
  Filled 2014-11-28: qty 1

## 2014-11-28 NOTE — ED Notes (Signed)
Stiff neck since this am.. Unable to turn rt or lt without extreme pain no previous history.

## 2014-11-28 NOTE — Discharge Instructions (Signed)
Naprosyn for pain and inflammation. Robaxin for muscle spasms as prescribed. Heating pads. Gentle stretches. Follow up with your doctor.   Torticollis, Acute You have suddenly (acutely) developed a twisted neck (torticollis). This is usually a self-limited condition. CAUSES  Acute torticollis may be caused by malposition, trauma or infection. Most commonly, acute torticollis is caused by sleeping in an awkward position. Torticollis may also be caused by the flexion, extension or twisting of the neck muscles beyond their normal position. Sometimes, the exact cause may not be known. SYMPTOMS  Usually, there is pain and limited movement of the neck. Your neck may twist to one side. DIAGNOSIS  The diagnosis is often made by physical examination. X-rays, CT scans or MRIs may be done if there is a history of trauma or concern of infection. TREATMENT  For a common, stiff neck that develops during sleep, treatment is focused on relaxing the contracted neck muscle. Medications (including shots) may be used to treat the problem. Most cases resolve in several days. Torticollis usually responds to conservative physical therapy. If left untreated, the shortened and spastic neck muscle can cause deformities in the face and neck. Rarely, surgery is required. HOME CARE INSTRUCTIONS   Use over-the-counter and prescription medications as directed by your caregiver.  Do stretching exercises and massage the neck as directed by your caregiver.  Follow up with physical therapy if needed and as directed by your caregiver. SEEK IMMEDIATE MEDICAL CARE IF:   You develop difficulty breathing or noisy breathing (stridor).  You drool, develop trouble swallowing or have pain with swallowing.  You develop numbness or weakness in the hands or feet.  You have changes in speech or vision.  You have problems with urination or bowel movements.  You have difficulty walking.  You have a fever.  You have increased  pain. MAKE SURE YOU:   Understand these instructions.  Will watch your condition.  Will get help right away if you are not doing well or get worse. Document Released: 02/10/2000 Document Revised: 05/07/2011 Document Reviewed: 03/23/2009 [facility] Patient Information 2015 [place], [place]. This information is not intended to replace advice given to you by your health care provider. Make sure you discuss any questions you have with your health care provider.

## 2014-11-28 NOTE — ED Notes (Signed)
Pt to xray and just returned

## 2014-11-28 NOTE — ED Notes (Signed)
The pt is sleepy and he feels a little  better

## 2014-11-28 NOTE — ED Notes (Signed)
He was lying on the couch this morning and when he stood up he was unable to rotate his neck left or right. He repots hx neck pain and he has been dx with "cervical disk problems" in the past. He is A&Ox4, ambulatory, mae

## 2014-11-28 NOTE — ED Notes (Signed)
Heat pack to neck

## 2014-11-28 NOTE — ED Provider Notes (Signed)
CSN: [id number]     Arrival date & time 11/28/14  1617 History  By signing my name below, I, Irene Pap, attest that this documentation has been prepared under the direction and in the presence of Cashtyn Pouliot, PA-C. Electronically Signed: Irene Pap, ED Scribe. 11/28/2014. 5:52 PM.  Chief Complaint  Patient presents with  . Neck Pain   The history is provided by the patient. No language interpreter was used.  HPI Comments: Ricardo [name] is a 64 y.o. Male with hx of HTN, lumbar and cervical DDD who presents to the Emergency Department complaining of bilateral neck pain and stiffness onset earlier this morning. Pt states that he was laying down on the couch, got up and was unable to turn his neck either direction. Reports associated headache due to the pain. He believes he worsened his compressed and degenerative discs to the point where he cannot move it. He denies trying anything for the pain. He denies recent fall or injury, radiating pain to arms, numbness, tingling, or weakness. Denies use of daily medications or allergies to medications.   Past Medical History  Diagnosis Date  . Hypertension   . Depression   . Diabetes mellitus   . Hyperlipemia   . Fatigue   . Degenerative disc disease, lumbar   . Epicondylitis     bilateral  . ARTHRITIS, HIP 08/10/2008  . BURSITIS, RIGHT SHOULDER 05/20/2008  . Degeneration of lumbar or lumbosacral intervertebral disc 11/27/2006  . DEGENERATION, CERVICAL DISC 11/27/2006  . DEPRESSION 11/25/2006  . DIABETES MELLITUS, TYPE II 04/10/2007  . HERPES GENITALIS 09/17/2007  . HYPERLIPIDEMIA 03/31/2007  . HYPERTENSION 11/25/2006  . HYPOTHYROIDISM 10/28/2007  . Impaired fasting glucose 12/26/2006  . Lateral epicondylitis  of elbow 05/05/2007  . Sciatica 10/28/2007  . SHOULDER PAIN, RIGHT 08/18/2008   Past Surgical History  Procedure Laterality Date  . Herniated lumbar disc  1997, 2011  . Elbow surgery  2005    left  . Knee arthroscopy  2007     left  . Rotator cuff repair  1997    right  . Achilles tendon repair      left   Family History  Problem Relation Age of Onset  . Diabetes Mother   . Heart disease Mother   . Hypertension Mother   . Heart disease Father   . Colon cancer Neg Hx   . Diabetes Sister    Social History  Substance Use Topics  . Smoking status: Never Smoker   . Smokeless tobacco: Never Used  . Alcohol Use: 1.8 oz/week    3 Cans of beer per week    Review of Systems  Musculoskeletal: Positive for neck pain and neck stiffness.  Neurological: Positive for headaches. Negative for weakness and numbness.  All other systems reviewed and are negative.   Allergies  Review of patient's allergies indicates no known allergies.  Home Medications   Prior to Admission medications   Medication Sig Start Date End Date Taking? Authorizing Provider  acyclovir (ZOVIRAX) 400 MG tablet TAKE 1 TABLET BY MOUTH 3 TIMES DAILY 08/17/14   Lew Dawes V, MD  aspirin 81 MG tablet Take 81 mg by mouth daily.      Historical Provider, MD  atorvastatin (LIPITOR) 20 MG tablet TAKE 1 TABLET BY MOUTH ONCE DAILY 03/01/14   Aleksei [name] V, MD  glipiZIDE (GLUCOTROL XL) 10 MG 24 hr tablet TAKE 2 TABLETS BY MOUTH EVERY DAY 09/08/14   Aleksei [name] V, MD  glucose blood (ONE  TOUCH ULTRA TEST) test strip Use as instructed 08/08/11   Aleksei [name] V, MD  levothyroxine (SYNTHROID, LEVOTHROID) 100 MCG tablet TAKE 1 TABLET BY MOUTH ONCE DAILY 01/04/14   Aleksei [name] V, MD  losartan (COZAAR) 100 MG tablet TAKE 1 TABLET BY MOUTH ONCE DAILY 03/08/14   Aleksei [name] V, MD  metFORMIN (GLUCOPHAGE) 1000 MG tablet TAKE 1 TABLET TWICE DAILY 02/01/14   Aleksei [name] V, MD  pioglitazone (ACTOS) 15 MG tablet Take 1 tablet (15 mg total) by mouth daily. 03/01/14   Rowe Clack, MD  sitaGLIPtin (JANUVIA) 100 MG tablet Take 1 tablet (100 mg total) by mouth daily. 05/31/14   Elayne Snare, MD   BP 152/84 mmHg  Pulse 87  Temp(Src) 98  F (36.7 C) (Oral)  Resp 16  Ht 5\' 5"  (1.651 m)  Wt 167 lb (75.751 kg)  BMI 27.79 kg/m2 Physical Exam  Constitutional: He is oriented to person, place, and time. He appears well-developed and well-nourished.  HENT:  Head: Normocephalic and atraumatic.  Eyes: EOM are normal.  Neck: Neck supple.  No midline spine tenderness. ttp over bilateral trapezius and sternocleidomastoid muscles. Pain with any rom of the neck  Cardiovascular: Normal rate.   Pulmonary/Chest: Effort normal.  Musculoskeletal: Normal range of motion.  Neurological: He is alert and oriented to person, place, and time.  5/5 and equal upper and lower extremities bilateral  Skin: Skin is warm and dry.  Psychiatric: He has a normal mood and affect. His behavior is normal.  Nursing note and vitals reviewed.   ED Course  Procedures (including critical care time)   COORDINATION OF CARE: 4:47 PM-Discussed treatment plan which includes x-ray and muscle relaxants with pt at bedside and pt agreed to plan.   Labs Review Labs Reviewed - No data to display  Imaging Review Dg Cervical Spine Complete  11/28/2014   CLINICAL DATA:  Neck pain and stiffness which began this morning  EXAM: CERVICAL SPINE  4+ VIEWS  COMPARISON:  None.  FINDINGS: Straightening of the cervical lordosis. No fracture. No prevertebral soft tissue swelling. There is mild degenerative disc disease at C3-4, C4-5, C5-6, and C6-7. There is C4-5 and C5-6 moderate foraminal narrowing, with mild foraminal narrowing above and below this level, bilaterally. Mild left carotid calcification.  IMPRESSION: Degenerative changes with no acute findings   Electronically Signed   By: Skipper Cliche M.D.   On: 11/28/2014 17:49    EKG Interpretation None      MDM   Final diagnoses:  Torticollis     patient emergency department with acute onset of bilateral neck pain after laying on the couch. No pain radiation. No numbness or weakness in extremities. Most likely  muscle spasms. Valium and Toradol given in emergency department. X-ray obtained which showed degenerative changes, otherwise unremarkable. Patient is feeling better after medications. Will discharge home with Robaxin, naproxen, follow with primary care doctor. Patient is afebrile, doubt meningitis or subarachnoid infection.. Doubt spinal cord or nerve problem, the pain radiation, no injuries. Stable for discharge home at this time.  Filed Vitals:   11/28/14 1627  BP: 152/84  Pulse: 87  Temp: 98 F (36.7 C)  Resp: 16    I personally performed the services described in this documentation, which was scribed in my presence. The recorded information has been reviewed and is accurate.    Jeannett Senior, PA-C 11/28/14 [address], MD 11/29/14 [phone]

## 2014-11-28 NOTE — ED Notes (Signed)
Declined W/C at D/C and was escorted to lobby by RN. 

## 2014-12-17 ENCOUNTER — Other Ambulatory Visit: Payer: Self-pay | Admitting: Endocrinology

## 2014-12-20 ENCOUNTER — Other Ambulatory Visit: Payer: Self-pay | Admitting: Internal Medicine

## 2014-12-20 NOTE — Telephone Encounter (Signed)
Patient states that he is taking the methocarbamol (ROBAXIN) 500 MG tablet [[id number]] , but needs something a little stronger to overcome the pain. He asks for an anti inflammatory for the spasms. He is out of town in [place]...  [facility]  Address: [address], [place],  [address] [phone]

## 2014-12-22 MED ORDER — MELOXICAM 15 MG PO TABS: 15.0000 mg | ORAL_TABLET | Freq: Every day | ORAL | Status: DC

## 2014-12-22 NOTE — Telephone Encounter (Signed)
Called [facility] in [place] and left message for pharm to fill meloxicam

## 2014-12-22 NOTE — Telephone Encounter (Signed)
Patient has called back in regards.  He is requesting a call in regards.  States pharmacy has not received anything yet.

## 2014-12-22 NOTE — Telephone Encounter (Signed)
Ok Meloxicam Thx

## 2015-02-09 ENCOUNTER — Encounter: Payer: Self-pay | Admitting: Internal Medicine

## 2015-02-09 ENCOUNTER — Other Ambulatory Visit (INDEPENDENT_AMBULATORY_CARE_PROVIDER_SITE_OTHER): Payer: PRIVATE HEALTH INSURANCE

## 2015-02-09 ENCOUNTER — Ambulatory Visit (INDEPENDENT_AMBULATORY_CARE_PROVIDER_SITE_OTHER): Payer: PRIVATE HEALTH INSURANCE | Admitting: Internal Medicine

## 2015-02-09 VITALS — BP 136/88 | HR 90 | Ht 65.0 in | Wt 169.0 lb

## 2015-02-09 DIAGNOSIS — E038 Other specified hypothyroidism: Secondary | ICD-10-CM | POA: Diagnosis not present

## 2015-02-09 DIAGNOSIS — E119 Type 2 diabetes mellitus without complications: Secondary | ICD-10-CM

## 2015-02-09 DIAGNOSIS — E785 Hyperlipidemia, unspecified: Secondary | ICD-10-CM

## 2015-02-09 DIAGNOSIS — Z0000 Encounter for general adult medical examination without abnormal findings: Secondary | ICD-10-CM | POA: Diagnosis not present

## 2015-02-09 DIAGNOSIS — I10 Essential (primary) hypertension: Secondary | ICD-10-CM

## 2015-02-09 DIAGNOSIS — D485 Neoplasm of uncertain behavior of skin: Secondary | ICD-10-CM

## 2015-02-09 DIAGNOSIS — M542 Cervicalgia: Secondary | ICD-10-CM | POA: Insufficient documentation

## 2015-02-09 DIAGNOSIS — N320 Bladder-neck obstruction: Secondary | ICD-10-CM

## 2015-02-09 DIAGNOSIS — M5030 Other cervical disc degeneration, unspecified cervical region: Secondary | ICD-10-CM | POA: Diagnosis not present

## 2015-02-09 DIAGNOSIS — E034 Atrophy of thyroid (acquired): Secondary | ICD-10-CM

## 2015-02-09 LAB — LIPID PANEL
CHOL/HDL RATIO: 4
Cholesterol: 216 mg/dL — ABNORMAL HIGH (ref 0–200)
HDL: 57.7 mg/dL (ref >39.00)
LDL Cholesterol: 141 mg/dL — ABNORMAL HIGH (ref 0–99)
NonHDL: 158.64
TRIGLYCERIDES: 87 mg/dL (ref 0.0–149.0)
VLDL: 17.4 mg/dL (ref 0.0–40.0)

## 2015-02-09 LAB — URINALYSIS
Bilirubin Urine: NEGATIVE
HGB URINE DIPSTICK: NEGATIVE
KETONES UR: NEGATIVE
LEUKOCYTES UA: NEGATIVE
Nitrite: NEGATIVE
Specific Gravity, Urine: >=1.03 — AB (ref 1.000–1.030)
Total Protein, Urine: NEGATIVE
URINE GLUCOSE: NEGATIVE
UROBILINOGEN UA: 0.2 (ref 0.0–1.0)
pH: 5.5 (ref 5.0–8.0)

## 2015-02-09 LAB — BASIC METABOLIC PANEL
BUN: 17 mg/dL (ref 6–23)
CALCIUM: 10.1 mg/dL (ref 8.4–10.5)
CO2: 30 meq/L (ref 19–32)
Chloride: 102 mEq/L (ref 96–112)
Creatinine, Ser: 1.26 mg/dL (ref 0.40–1.50)
GFR: 61.44 mL/min (ref >60.00)
Glucose, Bld: 175 mg/dL — ABNORMAL HIGH (ref 70–99)
Potassium: 4.9 mEq/L (ref 3.5–5.1)
SODIUM: 140 meq/L (ref 135–145)

## 2015-02-09 LAB — HEPATIC FUNCTION PANEL
ALBUMIN: 4.5 g/dL (ref 3.5–5.2)
ALT: 11 U/L (ref 0–53)
AST: 14 U/L (ref 0–37)
Alkaline Phosphatase: 30 U/L — ABNORMAL LOW (ref 39–117)
BILIRUBIN TOTAL: 0.6 mg/dL (ref 0.2–1.2)
Bilirubin, Direct: 0.1 mg/dL (ref 0.0–0.3)
TOTAL PROTEIN: 7.2 g/dL (ref 6.0–8.3)

## 2015-02-09 LAB — HEMOGLOBIN A1C: HEMOGLOBIN A1C: 6.8 % — AB (ref 4.6–6.5)

## 2015-02-09 LAB — TSH: TSH: 1.82 u[IU]/mL (ref 0.35–4.50)

## 2015-02-09 LAB — PSA: PSA: 2.15 ng/mL (ref 0.10–4.00)

## 2015-02-09 NOTE — Assessment & Plan Note (Signed)
Recurrent pain Stretch X ray, PT if needed

## 2015-02-09 NOTE — Assessment & Plan Note (Signed)
Chronic  On  Losartan, ASA

## 2015-02-09 NOTE — Progress Notes (Signed)
Pre visit review using our clinic review tool, if applicable. No additional management support is needed unless otherwise documented below in the visit note. 

## 2015-02-09 NOTE — Assessment & Plan Note (Signed)
On Levothroid Labs 

## 2015-02-09 NOTE — Assessment & Plan Note (Addendum)
Discussed  Saw palmetto PSA CBG/A1c

## 2015-02-09 NOTE — Assessment & Plan Note (Signed)
MSK 12/16 Meloxicam

## 2015-02-09 NOTE — Assessment & Plan Note (Signed)
Chronic  On Janvia, Metformin, Glipizide, Losartan, Lipitor, ASA

## 2015-02-09 NOTE — Assessment & Plan Note (Signed)
12/16 face x2, R back x1 Skin bx suggested Pt wants to observe his moles yet RTC 6 mo

## 2015-02-09 NOTE — Assessment & Plan Note (Signed)
We discussed age appropriate health related issues, including available/recomended screening tests and vaccinations. We discussed a need for adhering to healthy diet and exercise. Labs/EKG were reviewed/ordered. All questions were answered.  Zostavax suggested Colon is due 2024 

## 2015-02-09 NOTE — Progress Notes (Signed)
Subjective:  Patient ID: Ricardo [name], male    DOB: [birth year]  Age: 64 y.o. MRN: [id number]  CC: Annual Exam   HPI [name] presents for a well exam. C/o neck pain - new, tried Meloxicam. F/u HTN, dyslipidemia, DM. C/o frequency  Outpatient Prescriptions Prior to Visit  Medication Sig Dispense Refill  . acyclovir (ZOVIRAX) 400 MG tablet TAKE 1 TABLET BY MOUTH 3 TIMES DAILY 270 tablet 0  . aspirin 81 MG tablet Take 81 mg by mouth daily.      [name] atorvastatin (LIPITOR) 20 MG tablet TAKE 1 TABLET BY MOUTH ONCE DAILY 90 tablet 3  . glipiZIDE (GLUCOTROL XL) 10 MG 24 hr tablet TAKE 2 TABLETS BY MOUTH EVERY DAY 180 tablet 2  . glucose blood (ONE TOUCH ULTRA TEST) test strip Use as instructed 100 each 2  . JANUVIA 100 MG tablet TAKE 1 TABLET (100 MG TOTAL) BY MOUTH DAILY. 90 tablet 0  . levothyroxine (SYNTHROID, LEVOTHROID) 100 MCG tablet TAKE 1 TABLET BY MOUTH ONCE DAILY 90 tablet 3  . meloxicam (MOBIC) 15 MG tablet Take 1 tablet (15 mg total) by mouth daily. 30 tablet 0  . metFORMIN (GLUCOPHAGE) 1000 MG tablet TAKE 1 TABLET TWICE DAILY 180 tablet 3  . methocarbamol (ROBAXIN) 500 MG tablet Take 1 tablet (500 mg total) by mouth 2 (two) times daily. 20 tablet 0  . pioglitazone (ACTOS) 15 MG tablet Take 1 tablet (15 mg total) by mouth daily. 90 tablet 1  . losartan (COZAAR) 100 MG tablet TAKE 1 TABLET BY MOUTH ONCE DAILY (Patient not taking: Reported on 02/09/2015) 90 tablet 3   Facility-Administered Medications Prior to Visit  Medication Dose Route Frequency Provider Last Rate Last Dose  . methylPREDNISolone acetate (DEPO-MEDROL) injection 40 mg  40 mg Intra-Lesional Once Aleksei [name] V, MD        ROS Review of Systems  Constitutional: Negative for appetite change, fatigue and unexpected weight change.  HENT: Negative for congestion, nosebleeds, sneezing, sore throat and trouble swallowing.   Eyes: Negative for itching and visual disturbance.  Respiratory: Negative for  cough.   Cardiovascular: Negative for chest pain, palpitations and leg swelling.  Gastrointestinal: Negative for nausea, diarrhea, blood in stool and abdominal distention.  Genitourinary: Positive for urgency and frequency. Negative for hematuria.  Musculoskeletal: Positive for back pain, arthralgias, neck pain and neck stiffness. Negative for joint swelling and gait problem.  Skin: Negative for rash.  Neurological: Negative for dizziness, tremors, speech difficulty and weakness.  Psychiatric/Behavioral: Negative for sleep disturbance, dysphoric mood and agitation. The patient is nervous/anxious.     Objective:  BP 136/88 mmHg  Pulse 90  Ht 5\' 5"  (1.651 m)  Wt 169 lb (76.658 kg)  BMI 28.12 kg/m2  SpO2 94%  BP Readings from Last 3 Encounters:  02/09/15 136/88  11/28/14 128/89  05/17/14 155/95    Wt Readings from Last 3 Encounters:  02/09/15 169 lb (76.658 kg)  11/28/14 167 lb (75.751 kg)  05/17/14 173 lb 3.2 oz (78.563 kg)    Physical Exam  Constitutional: He is oriented to person, place, and time. He appears well-developed and well-nourished. No distress.  HENT:  Head: Normocephalic and atraumatic.  Right Ear: External ear normal.  Left Ear: External ear normal.  Nose: Nose normal.  Mouth/Throat: Oropharynx is clear and moist. No oropharyngeal exudate.  Eyes: Conjunctivae and EOM are normal. Pupils are equal, round, and reactive to light. Right eye exhibits no discharge. Left eye exhibits no discharge. No scleral  icterus.  Neck: Normal range of motion. Neck supple. No JVD present. No tracheal deviation present. No thyromegaly present.  Cardiovascular: Normal rate, regular rhythm, normal heart sounds and intact distal pulses.  Exam reveals no gallop and no friction rub.   No murmur heard. Pulmonary/Chest: Effort normal and breath sounds normal. No stridor. No respiratory distress. He has no wheezes. He has no rales. He exhibits no tenderness.  Abdominal: Soft. Bowel sounds  are normal. He exhibits no distension and no mass. There is no tenderness. There is no rebound and no guarding.  Genitourinary: Rectum normal and penis normal. Guaiac negative stool. No penile tenderness.  Musculoskeletal: Normal range of motion. He exhibits no edema or tenderness.  Lymphadenopathy:    He has no cervical adenopathy.  Neurological: He is alert and oriented to person, place, and time. He has normal reflexes. No cranial nerve deficit. He exhibits normal muscle tone. Coordination normal.  Skin: Skin is warm and dry. No rash noted. He is not diaphoretic. No erythema. No pallor.  Psychiatric: He has a normal mood and affect. His behavior is normal. Judgment and thought content normal.  neck tender over B traps and w/ROM LS tender Small moles - face, back Prostate 1+ NT  Lab Results  Component Value Date   WBC 7.3 12/31/2013   HGB 13.9 12/31/2013   HCT 42.4 12/31/2013   PLT 237.0 12/31/2013   GLUCOSE 124* 08/16/2014   CHOL 176 12/31/2013   TRIG 82.0 12/31/2013   HDL 39.50 12/31/2013   LDLDIRECT 126.0 05/12/2014   LDLCALC 120* 12/31/2013   ALT 17 08/16/2014   AST 17 08/16/2014   NA 137 08/16/2014   K 4.2 08/16/2014   CL 101 08/16/2014   CREATININE 1.24 08/16/2014   BUN 17 08/16/2014   CO2 29 08/16/2014   TSH 2.20 12/31/2013   PSA 2.32 12/31/2013   INR 1.0 04/02/2007   HGBA1C 7.0* 08/16/2014   MICROALBUR 0.2 01/18/2014    Dg Cervical Spine Complete  11/28/2014  CLINICAL DATA:  Neck pain and stiffness which began this morning EXAM: CERVICAL SPINE  4+ VIEWS COMPARISON:  None. FINDINGS: Straightening of the cervical lordosis. No fracture. No prevertebral soft tissue swelling. There is mild degenerative disc disease at C3-4, C4-5, C5-6, and C6-7. There is C4-5 and C5-6 moderate foraminal narrowing, with mild foraminal narrowing above and below this level, bilaterally. Mild left carotid calcification. IMPRESSION: Degenerative changes with no acute findings Electronically  Signed   By: Skipper Cliche M.D.   On: 11/28/2014 17:49    Assessment & Plan:   [name] was seen today for annual exam.  Diagnoses and all orders for this visit:  Well adult exam -     Hepatic function panel; Future -     Basic metabolic panel; Future -     Lipid panel; Future -     TSH; Future -     Urinalysis; Future -     PSA; Future -     Hepatitis C antibody; Future  Controlled type 2 diabetes mellitus without complication, without long-term current use of insulin (HCC) -     Hemoglobin A1c; Future  Essential hypertension  Dyslipidemia  Neck pain  Bladder neck obstruction -     PSA; Future  Neoplasm of uncertain behavior of skin   I am having Mr. Derr maintain his aspirin, glucose blood, levothyroxine, metFORMIN, atorvastatin, pioglitazone, losartan, acyclovir, glipiZIDE, methocarbamol, JANUVIA, and meloxicam. We will continue to administer methylPREDNISolone acetate.  No orders of the  defined types were placed in this encounter.     Follow-up: Return in about 6 months (around 08/10/2015) for a follow-up visit.  Walker Kehr, MD

## 2015-02-09 NOTE — Assessment & Plan Note (Signed)
Chronic  On Lipitor, ASA

## 2015-02-10 LAB — HM DIABETES EYE EXAM

## 2015-02-10 LAB — HEPATITIS C ANTIBODY: HCV Ab: NEGATIVE

## 2015-02-17 ENCOUNTER — Encounter: Payer: Self-pay | Admitting: Endocrinology

## 2015-02-18 ENCOUNTER — Encounter: Payer: Self-pay | Admitting: Internal Medicine

## 2015-02-18 ENCOUNTER — Telehealth: Payer: Self-pay | Admitting: *Deleted

## 2015-02-18 NOTE — Telephone Encounter (Signed)
Please see Patients email below and advise  DR.Dwyane Dee , I had PSA,URINalsis auto panel, TSH,LIPID PANEL,BASIC METABOLIC PANEL,HEMOGLOBIN A1C,HEPATIC FUNCTION PANEL WORK DONE FROM DR.PLOTNOKIV DO I NEED TO HAVE BLOOD WORK ON JAN 3 APPT.

## 2015-02-18 NOTE — Telephone Encounter (Signed)
Noted, he is aware.

## 2015-02-18 NOTE — Telephone Encounter (Signed)
Does not need any other lab work

## 2015-02-28 ENCOUNTER — Other Ambulatory Visit: Payer: Self-pay | Admitting: Internal Medicine

## 2015-03-01 ENCOUNTER — Other Ambulatory Visit: Payer: PRIVATE HEALTH INSURANCE

## 2015-03-02 ENCOUNTER — Encounter: Payer: Self-pay | Admitting: Endocrinology

## 2015-03-02 ENCOUNTER — Ambulatory Visit (INDEPENDENT_AMBULATORY_CARE_PROVIDER_SITE_OTHER): Payer: PRIVATE HEALTH INSURANCE | Admitting: Endocrinology

## 2015-03-02 VITALS — BP 128/78 | HR 78 | Temp 98.3°F | Resp 14 | Ht 65.0 in | Wt 170.8 lb

## 2015-03-02 DIAGNOSIS — E1165 Type 2 diabetes mellitus with hyperglycemia: Secondary | ICD-10-CM

## 2015-03-02 NOTE — Progress Notes (Signed)
Patient ID: Ricardo [name], male   DOB: [birth year], 65 y.o.   MRN: [id number]           Reason for Appointment: Follow-up for Type 2 Diabetes  Referring physician:  [name]   History of Present Illness:          Diagnosis: Type 2 diabetes mellitus, date of diagnosis: 2009       Past history: At diagnosis he was having symptoms of fatigue.  He was started on glipizide initially and subsequently metformin was added.  Further details are not available However he has been somewhat irregular with his follow-up and did not have an A1c with his PCP between 8/13 and 11/15 He was referred here because of rising A1c of 7.5  Recent history:   He has not been seen in follow-up since 3/16  He had been started back on Actos on the last visit along with his glipizide ER 20 mg, Januvia and metformin maximum dose His A1c in 12/16 was slightly better at 6.8  Current blood sugar patterns, problems identified and management:  His blood sugars are overall high especially in the mornings  Blood sugars around lunchtime are usually fairly good with only occasional high readings  He is checking blood sugars sporadically later in the day and these are variable with a few good readings also  He will sometimes eat late at night causing high readings in the mornings  He started exercising only a couple of days ago  Although his weight is about the same as on his last visit he does not think he has been consistent with his diet especially in the last few weeks, does not think he controls his portions adequately       Oral hypoglycemic drugs the patient is taking are: glipizide ER 20 mg, metformin 1 g twice a day      Side effects from medications have been: None Compliance with the medical regimen: Fair Hypoglycemia: Rarely    Glucose monitoring:  done one time a day         Glucometer:  ?  One Touch ultra .      Blood Glucose readings   Mean values apply above for all meters except median for One  Touch  PRE-MEAL Fasting Lunch Dinner Bedtime Overall  Glucose range:  101-199   97-252   103-264   140-223    Mean/median: 163  136   174   179  163    Self-care:  Meals: 3 meals per day. Breakfast is oatmeal/cereal/eggs and toast.  Has sandwich or soup at lunch       Exercise: using bike, weight         Dietician visit, most recent: at diagnosis               Weight history: 163-191  Wt Readings from Last 3 Encounters:  03/02/15 170 lb 12.8 oz (77.474 kg)  02/09/15 169 lb (76.658 kg)  11/28/14 167 lb (75.751 kg)    Glycemic control:   Lab Results  Component Value Date   HGBA1C 6.8* 02/09/2015   HGBA1C 7.0* 08/16/2014   HGBA1C 7.0* 05/12/2014   Lab Results  Component Value Date   MICROALBUR 0.2 01/18/2014   LDLCALC 141* 02/09/2015   CREATININE 1.26 02/09/2015       Medication List       This list is accurate as of: 03/02/15 12:33 PM.  Always use your most recent med list.  acyclovir 400 MG tablet  Commonly known as:  ZOVIRAX  TAKE 1 TABLET BY MOUTH 3 TIMES DAILY     aspirin 81 MG tablet  Take 81 mg by mouth daily.     atorvastatin 20 MG tablet  Commonly known as:  LIPITOR  TAKE 1 TABLET BY MOUTH ONCE DAILY     glipiZIDE 10 MG 24 hr tablet  Commonly known as:  GLUCOTROL XL  TAKE 2 TABLETS BY MOUTH EVERY DAY     glucose blood test strip  Commonly known as:  ONE TOUCH ULTRA TEST  Use as instructed     JANUVIA 100 MG tablet  Generic drug:  sitaGLIPtin  TAKE 1 TABLET (100 MG TOTAL) BY MOUTH DAILY.     levothyroxine 100 MCG tablet  Commonly known as:  SYNTHROID, LEVOTHROID  TAKE 1 TABLET BY MOUTH ONCE DAILY     losartan 100 MG tablet  Commonly known as:  COZAAR  TAKE 1 TABLET BY MOUTH ONCE DAILY     metFORMIN 1000 MG tablet  Commonly known as:  GLUCOPHAGE  TAKE 1 TABLET BY MOUTH TWICE DAILY     pioglitazone 15 MG tablet  Commonly known as:  ACTOS  Take 1 tablet (15 mg total) by mouth daily.        Allergies: No Known  Allergies  Past Medical History  Diagnosis Date  . Hypertension   . Depression   . Diabetes mellitus   . Hyperlipemia   . Fatigue   . Degenerative disc disease, lumbar   . Epicondylitis     bilateral  . ARTHRITIS, HIP 08/10/2008  . BURSITIS, RIGHT SHOULDER 05/20/2008  . Degeneration of lumbar or lumbosacral intervertebral disc 11/27/2006  . DEGENERATION, CERVICAL DISC 11/27/2006  . DEPRESSION 11/25/2006  . DIABETES MELLITUS, TYPE II 04/10/2007  . HERPES GENITALIS 09/17/2007  . HYPERLIPIDEMIA 03/31/2007  . HYPERTENSION 11/25/2006  . HYPOTHYROIDISM 10/28/2007  . Impaired fasting glucose 12/26/2006  . Lateral epicondylitis  of elbow 05/05/2007  . Sciatica 10/28/2007  . SHOULDER PAIN, RIGHT 08/18/2008    Past Surgical History  Procedure Laterality Date  . Herniated lumbar disc  1997, 2011  . Elbow surgery  2005    left  . Knee arthroscopy  2007    left  . Rotator cuff repair  1997    right  . Achilles tendon repair      left    Family History  Problem Relation Age of Onset  . Diabetes Mother   . Heart disease Mother   . Hypertension Mother   . Heart disease Father   . Colon cancer Neg Hx   . Diabetes Sister     Social History:  reports that he has never smoked. He has never used smokeless tobacco. He reports that he drinks about 1.8 oz of alcohol per week. He reports that he does not use illicit drugs.    Review of Systems        Most recent eye exam was in  12/16       Lipids: He has been treated with Lipitor 20 mg but LDL is not at target.  Previously had been inconsistently compliant with this He has been treated by his PCP      Lab Results  Component Value Date   CHOL 216* 02/09/2015   HDL 57.70 02/09/2015   LDLCALC 141* 02/09/2015   LDLDIRECT 126.0 05/12/2014   TRIG 87.0 02/09/2015   CHOLHDL 4 02/09/2015  Thyroid: He has been on Synthroid for the last few years, initially had symptoms of fatigue at diagnosis  Lab Results  Component Value  Date   TSH 1.82 02/09/2015   TSH 2.20 12/31/2013   TSH 1.94 12/23/2012   Diabetic foot exam normal in 11/15   LABS:  No visits with results within 1 Week(s) from this visit. Latest known visit with results is:  Documentation on 02/18/2015  Component Date Value Ref Range Status  . HM Diabetic Eye Exam 02/10/2015 No Retinopathy  No Retinopathy Final    Physical Examination:  BP 128/78 mmHg  Pulse 78  Temp(Src) 98.3 F (36.8 C)  Resp 14  Ht 5\' 5"  (1.651 m)  Wt 170 lb 12.8 oz (77.474 kg)  BMI 28.42 kg/m2  SpO2 96%             ASSESSMENT:  Diabetes type 2, nonobese.  See history of present illness for detailed discussion of his current management, blood sugar patterns and problems identified He is now following up after about 9 months  His last A1c improved at 6.8 but his blood sugars appear to be overall high recently at home He appears to be doing somewhat better with adding Actos and using a 4 drug regimen Fasting blood sugars are mostly high and has sporadic high readings later in the day He thinks he can do better with his diet and exercise regimen Discussed consultation with dietitian but he refuses any thinks he knows what to do but just does not do it Has started exercise only in the last couple of days  PLAN:   Continue current regimen but consider using Invokana instead of Januvia 100 mg daily  He will call if he wants to see the dietitian  More consistent monitoring after meals  Consistent exercise  Review home readings and A1c in 3 months again    [facility] 03/02/2015, 12:33 PM   Note: This office note was prepared with Dragon voice recognition system technology. Any transcriptional errors that result from this process are unintentional.

## 2015-03-02 NOTE — Patient Instructions (Signed)
Check cost of Invokana  Check blood sugars on waking up  3-4 times a week Also check blood sugars about 2 hours after a meal and do this after different meals by rotation  Recommended blood sugar levels on waking up is 90-130 and about 2 hours after meal is 130-160  Please bring your blood sugar monitor to each visit, thank you

## 2015-03-04 ENCOUNTER — Ambulatory Visit: Payer: PRIVATE HEALTH INSURANCE | Admitting: Endocrinology

## 2015-03-11 ENCOUNTER — Other Ambulatory Visit: Payer: Self-pay | Admitting: *Deleted

## 2015-03-11 MED ORDER — LEVOTHYROXINE SODIUM 100 MCG PO TABS: 100.0000 ug | ORAL_TABLET | Freq: Every day | ORAL | Status: DC

## 2015-04-05 ENCOUNTER — Other Ambulatory Visit: Payer: Self-pay | Admitting: Internal Medicine

## 2015-04-05 ENCOUNTER — Other Ambulatory Visit: Payer: Self-pay | Admitting: Endocrinology

## 2015-04-06 ENCOUNTER — Other Ambulatory Visit: Payer: Self-pay | Admitting: Internal Medicine

## 2015-04-06 NOTE — Telephone Encounter (Signed)
Please advise, thanks.

## 2015-04-08 ENCOUNTER — Other Ambulatory Visit: Payer: Self-pay | Admitting: Endocrinology

## 2015-04-08 ENCOUNTER — Other Ambulatory Visit: Payer: Self-pay | Admitting: Internal Medicine

## 2015-05-14 ENCOUNTER — Other Ambulatory Visit: Payer: Self-pay | Admitting: Internal Medicine

## 2015-05-25 ENCOUNTER — Other Ambulatory Visit (INDEPENDENT_AMBULATORY_CARE_PROVIDER_SITE_OTHER): Payer: PRIVATE HEALTH INSURANCE

## 2015-05-25 DIAGNOSIS — E1165 Type 2 diabetes mellitus with hyperglycemia: Secondary | ICD-10-CM

## 2015-05-25 LAB — LIPID PANEL
Cholesterol: 183 mg/dL (ref 0–200)
HDL: 46.6 mg/dL (ref >39.00)
LDL Cholesterol: 112 mg/dL — ABNORMAL HIGH (ref 0–99)
NONHDL: 136.02
Total CHOL/HDL Ratio: 4
Triglycerides: 119 mg/dL (ref 0.0–149.0)
VLDL: 23.8 mg/dL (ref 0.0–40.0)

## 2015-05-25 LAB — COMPREHENSIVE METABOLIC PANEL
ALK PHOS: 29 U/L — AB (ref 39–117)
ALT: 13 U/L (ref 0–53)
AST: 12 U/L (ref 0–37)
Albumin: 4.4 g/dL (ref 3.5–5.2)
BILIRUBIN TOTAL: 0.6 mg/dL (ref 0.2–1.2)
BUN: 17 mg/dL (ref 6–23)
CALCIUM: 9.8 mg/dL (ref 8.4–10.5)
CO2: 31 mEq/L (ref 19–32)
CREATININE: 1.32 mg/dL (ref 0.40–1.50)
Chloride: 103 mEq/L (ref 96–112)
GFR: 58.17 mL/min — AB (ref >60.00)
GLUCOSE: 135 mg/dL — AB (ref 70–99)
Potassium: 4.6 mEq/L (ref 3.5–5.1)
Sodium: 139 mEq/L (ref 135–145)
TOTAL PROTEIN: 7.1 g/dL (ref 6.0–8.3)

## 2015-05-25 LAB — HEMOGLOBIN A1C: HEMOGLOBIN A1C: 7.2 % — AB (ref 4.6–6.5)

## 2015-05-25 LAB — MICROALBUMIN / CREATININE URINE RATIO
CREATININE, U: 113.4 mg/dL
MICROALB/CREAT RATIO: 0.6 mg/g (ref 0.0–30.0)
Microalb, Ur: <0.7 mg/dL (ref 0.0–1.9)

## 2015-05-31 ENCOUNTER — Ambulatory Visit: Payer: PRIVATE HEALTH INSURANCE | Admitting: Endocrinology

## 2015-05-31 ENCOUNTER — Ambulatory Visit (INDEPENDENT_AMBULATORY_CARE_PROVIDER_SITE_OTHER): Payer: 59 | Admitting: Endocrinology

## 2015-05-31 ENCOUNTER — Encounter: Payer: Self-pay | Admitting: Endocrinology

## 2015-05-31 VITALS — BP 142/82 | HR 71 | Temp 98.1°F | Resp 14 | Ht 65.0 in | Wt 174.2 lb

## 2015-05-31 DIAGNOSIS — E1165 Type 2 diabetes mellitus with hyperglycemia: Secondary | ICD-10-CM

## 2015-05-31 MED ORDER — DULAGLUTIDE 0.75 MG/0.5ML ~~LOC~~ SOAJ: SUBCUTANEOUS | Status: DC

## 2015-05-31 NOTE — Patient Instructions (Addendum)
Start TRULICITYwith the pen as shown once weekly on the same day of the week.  You may inject in the stomach, thigh or arm as indicated in the brochure given.  You will feel fullness of the stomach with starting the medication and should try to keep the portions at meals small.  You may experience nausea in the first few days which usually gets better over time    If any questions or concerns are present call the office or the  [facility] at [phone]. Also visit [email or web address] website for more useful information  Glipizide 1 daily  Stop Januvia on Injection 2 day  Restart exercise

## 2015-05-31 NOTE — Progress Notes (Signed)
Patient ID: Ricardo [name], male   DOB: 08-12-1950, 65 y.o.   MRN: [id number]           Reason for Appointment: Follow-up for Type 2 Diabetes  Referring physician:  [name]   History of Present Illness:          Diagnosis: Type 2 diabetes mellitus, date of diagnosis: 2009       Past history: At diagnosis he was having symptoms of fatigue.  He was started on glipizide initially and subsequently metformin was added.  Further details are not available However he has been somewhat irregular with his follow-up and did not have an A1c with his PCP between 8/13 and 11/15 He was referred here because of rising A1c of 7.5  Recent history:   He had been on Actos along with his glipizide ER 20 mg, Januvia and metformin maximum dose His A1c in 12/16 was slightly better at 6.8 but is now up to 7.2  Current blood sugar patterns, problems identified and management:  His blood sugars are overall better but his A1c is higher than before  He probably is doing better with his diet at suppertime his postprandial readings are not usually high  However his fasting reading may be higher with higher fat meals at times  He says he is eating pizza 2 or 3 times a week at lunchtime with his grandchildren and not controlling portions  He may sometimes eat a large snack at bedtime also when he gets hungry  He has gained weight again  Does try to exercise at times       Oral hypoglycemic drugs the patient is taking are: glipizide ER 20 mg, metformin 1 g twice a day      Side effects from medications have been: None Compliance with the medical regimen: Fair Hypoglycemia: Rarely    Glucose monitoring:  done one time a day         Glucometer:  ?  One Touch ultra .      Blood Glucose readings   Mean values apply above for all meters except median for One Touch  PRE-MEAL Fasting Lunch Dinner Bedtime Overall  Glucose range: 85-175  [id number]  80-187  65-210    Mean/median: 151  161  149  119  150     Self-care:  Meals: 3 meals per day. Breakfast is oatmeal/cereal/eggs and toast.  Has sandwich or soup at lunch       Exercise: using bike, weight at times Dietician visit, most recent: at diagnosis               Weight history: 163-191  Wt Readings from Last 3 Encounters:  05/31/15 174 lb 3.2 oz (79.017 kg)  03/02/15 170 lb 12.8 oz (77.474 kg)  02/09/15 169 lb (76.658 kg)    Glycemic control:   Lab Results  Component Value Date   HGBA1C 7.2* 05/25/2015   HGBA1C 6.8* 02/09/2015   HGBA1C 7.0* 08/16/2014   Lab Results  Component Value Date   MICROALBUR <0.7 05/25/2015   LDLCALC 112* 05/25/2015   CREATININE 1.32 05/25/2015       Medication List       This list is accurate as of: 05/31/15  4:49 PM.  Always use your most recent med list.               acyclovir 400 MG tablet  Commonly known as:  ZOVIRAX  TAKE 1 TABLET BY MOUTH 3 TIMES DAILY  aspirin 81 MG tablet  Take 81 mg by mouth daily.     atorvastatin 20 MG tablet  Commonly known as:  LIPITOR  TAKE 1 TABLET EVERY DAY     Dulaglutide 0.75 MG/0.5ML Sopn  Commonly known as:  TRULICITY  Inject in the abdominal skin as directed once a week     glipiZIDE 10 MG 24 hr tablet  Commonly known as:  GLUCOTROL XL  TAKE 2 TABLETS BY MOUTH EVERY DAY     glucose blood test strip  Commonly known as:  ONE TOUCH ULTRA TEST  Use as instructed     JANUVIA 100 MG tablet  Generic drug:  sitaGLIPtin  TAKE 1 TABLET (100 MG TOTAL) BY MOUTH DAILY.     JANUVIA 100 MG tablet  Generic drug:  sitaGLIPtin  TAKE 1 TABLET (100 MG TOTAL) BY MOUTH DAILY.     levothyroxine 100 MCG tablet  Commonly known as:  SYNTHROID, LEVOTHROID  Take 1 tablet (100 mcg total) by mouth daily.     losartan 100 MG tablet  Commonly known as:  COZAAR  TAKE 1 TABLET BY MOUTH ONCE DAILY     metFORMIN 1000 MG tablet  Commonly known as:  GLUCOPHAGE  TAKE 1 TABLET BY MOUTH TWICE DAILY     pioglitazone 15 MG tablet  Commonly known as:  ACTOS   Take 1 tablet (15 mg total) by mouth daily.     pioglitazone 15 MG tablet  Commonly known as:  ACTOS  TAKE 1 TABLET EVERY DAY        Allergies: No Known Allergies  Past Medical History  Diagnosis Date  . Hypertension   . Depression   . Diabetes mellitus   . Hyperlipemia   . Fatigue   . Degenerative disc disease, lumbar   . Epicondylitis     bilateral  . ARTHRITIS, HIP 08/10/2008  . BURSITIS, RIGHT SHOULDER 05/20/2008  . Degeneration of lumbar or lumbosacral intervertebral disc 11/27/2006  . DEGENERATION, CERVICAL DISC 11/27/2006  . DEPRESSION 11/25/2006  . DIABETES MELLITUS, TYPE II 04/10/2007  . HERPES GENITALIS 09/17/2007  . HYPERLIPIDEMIA 03/31/2007  . HYPERTENSION 11/25/2006  . HYPOTHYROIDISM 10/28/2007  . Impaired fasting glucose 12/26/2006  . Lateral epicondylitis  of elbow 05/05/2007  . Sciatica 10/28/2007  . SHOULDER PAIN, RIGHT 08/18/2008    Past Surgical History  Procedure Laterality Date  . Herniated lumbar disc  1997, 2011  . Elbow surgery  2005    left  . Knee arthroscopy  2007    left  . Rotator cuff repair  1997    right  . Achilles tendon repair      left    Family History  Problem Relation Age of Onset  . Diabetes Mother   . Heart disease Mother   . Hypertension Mother   . Heart disease Father   . Colon cancer Neg Hx   . Diabetes Sister     Social History:  reports that he has never smoked. He has never used smokeless tobacco. He reports that he drinks about 1.8 oz of alcohol per week. He reports that he does not use illicit drugs.    Review of Systems        Most recent eye exam was in  12/16       Lipids: He has been treated with Lipitor 20 mg but LDL is not at target.  Previously had been inconsistently compliant with this He has been treated by his PCP  Lab Results  Component Value Date   CHOL 183 05/25/2015   HDL 46.60 05/25/2015   LDLCALC 112* 05/25/2015   LDLDIRECT 126.0 05/12/2014   TRIG 119.0 05/25/2015   CHOLHDL 4  05/25/2015                   Thyroid: He has been on Synthroid for the last few years, initially had symptoms of fatigue at diagnosis  Lab Results  Component Value Date   TSH 1.82 02/09/2015   TSH 2.20 12/31/2013   TSH 1.94 12/23/2012   Diabetic foot exam normal in 11/15   LABS:  Lab on 05/25/2015  Component Date Value Ref Range Status  . Hgb A1c MFr Bld 05/25/2015 7.2* 4.6 - 6.5 % Final   Glycemic Control Guidelines for People with Diabetes:Non Diabetic:  <6%Goal of Therapy: <7%Additional Action Suggested:  >8%   . Sodium 05/25/2015 139  135 - 145 mEq/L Final  . Potassium 05/25/2015 4.6  3.5 - 5.1 mEq/L Final  . Chloride 05/25/2015 103  96 - 112 mEq/L Final  . CO2 05/25/2015 31  19 - 32 mEq/L Final  . Glucose, Bld 05/25/2015 135* 70 - 99 mg/dL Final  . BUN 05/25/2015 17  6 - 23 mg/dL Final  . Creatinine, Ser 05/25/2015 1.32  0.40 - 1.50 mg/dL Final  . Total Bilirubin 05/25/2015 0.6  0.2 - 1.2 mg/dL Final  . Alkaline Phosphatase 05/25/2015 29* 39 - 117 U/L Final  . AST 05/25/2015 12  0 - 37 U/L Final  . ALT 05/25/2015 13  0 - 53 U/L Final  . Total Protein 05/25/2015 7.1  6.0 - 8.3 g/dL Final  . Albumin 05/25/2015 4.4  3.5 - 5.2 g/dL Final  . Calcium 05/25/2015 9.8  8.4 - 10.5 mg/dL Final  . GFR 05/25/2015 58.17* >60.00 mL/min Final  . Cholesterol 05/25/2015 183  0 - 200 mg/dL Final   ATP III Classification       Desirable:  < 200 mg/dL               Borderline High:  200 - 239 mg/dL          High:  > = 240 mg/dL  . Triglycerides 05/25/2015 119.0  0.0 - 149.0 mg/dL Final   Normal:  <150 mg/dLBorderline High:  150 - 199 mg/dL  . HDL 05/25/2015 46.60  >39.00 mg/dL Final  . VLDL 05/25/2015 23.8  0.0 - 40.0 mg/dL Final  . LDL Cholesterol 05/25/2015 112* 0 - 99 mg/dL Final  . Total CHOL/HDL Ratio 05/25/2015 4   Final                  Men          Women1/2 Average Risk     3.4          3.3Average Risk          5.0          4.42X Average Risk          9.6          7.13X Average  Risk          15.0          11.0                      . NonHDL 05/25/2015 136.02   Final   NOTE:  Non-HDL goal should be 30 mg/dL higher than patient's LDL goal (i.e. LDL goal of <  70 mg/dL, would have non-HDL goal of < 100 mg/dL)  . Microalb, Ur 05/25/2015 <0.7  0.0 - 1.9 mg/dL Final  . Creatinine,U 05/25/2015 113.4   Final  . Microalb Creat Ratio 05/25/2015 0.6  0.0 - 30.0 mg/g Final    Physical Examination:  BP 142/82 mmHg  Pulse 71  Temp(Src) 98.1 F (36.7 C)  Resp 14  Ht 5\' 5"  (1.651 m)  Wt 174 lb 3.2 oz (79.017 kg)  BMI 28.99 kg/m2  SpO2 97%             ASSESSMENT:  Diabetes type 2, nonobese.  See history of present illness for detailed discussion of his current management, blood sugar patterns and problems identified His A1c is higher at 7.2 and he has gained weight Most of the difficulty with control is related to poor diet with some high-fat intake Also can exercise more regularly He is already on 4 drug regimen but may be getting some insulin deficiency   PLAN:  Discussed with the patient the nature of GLP-1 drugs, the action on various organ systems, how they benefit blood glucose control, as well as the benefit of weight loss and  increase satiety . Explained possible side effects especially nausea and vomiting; discussed safety information in package insert. Demonstrated the medication injection device and injection technique to the patient. Discussed injection sites and titration of Trulicity starting with 0.75 mg once a week for 4 weeks and then increasing to 1.5 mg if no symptoms of nausea. Patient brochure on Trulicity and co-pay card given  Reduce glipizide to 10 mg  More consistent exercise  Cut back on fat intake with avoiding pizza and other high-fat foods and smaller portions of pasta and bedtime snacks  Consistent exercise  Review home readings in 4 weeks   Patient Instructions  Start TRULICITYwith the pen as shown once weekly on the same day of  the week.  You may inject in the stomach, thigh or arm as indicated in the brochure given.  You will feel fullness of the stomach with starting the medication and should try to keep the portions at meals small.  You may experience nausea in the first few days which usually gets better over time    If any questions or concerns are present call the office or the  [facility] at [phone]. Also visit [email or web address] website for more useful information  Glipizide 1 daily  Stop Januvia on Injection 2 day  Restart exercise     Counseling time on subjects discussed above is over 50% of today's 25 minute visit   Icesis Renn 05/31/2015, 4:49 PM   Note: This office note was prepared with Dragon voice recognition system technology. Any transcriptional errors that result from this process are unintentional.

## 2015-06-29 ENCOUNTER — Encounter: Payer: Self-pay | Admitting: Endocrinology

## 2015-06-29 ENCOUNTER — Ambulatory Visit (INDEPENDENT_AMBULATORY_CARE_PROVIDER_SITE_OTHER): Payer: 59 | Admitting: Endocrinology

## 2015-06-29 VITALS — BP 134/90 | HR 74 | Temp 98.0°F | Resp 14 | Ht 65.0 in | Wt 171.8 lb

## 2015-06-29 DIAGNOSIS — E1065 Type 1 diabetes mellitus with hyperglycemia: Secondary | ICD-10-CM

## 2015-06-29 NOTE — Progress Notes (Signed)
Patient ID: Ricardo [name], male   DOB: 18-Jan-1951, 65 y.o.   MRN: [id number]           Reason for Appointment: Follow-up for Type 2 Diabetes  Referring physician:  [name]   History of Present Illness:          Diagnosis: Type 2 diabetes mellitus, date of diagnosis: 2009       Past history: At diagnosis he was having symptoms of fatigue.  He was started on glipizide initially and subsequently metformin was added.  Further details are not available However he has been somewhat irregular with his follow-up and did not have an A1c with his PCP between 8/13 and 11/15 He was referred here because of rising A1c of 7.5  Recent history:   He had been started on Trulicity and [id number] because of gradually increasing A1c and periodic blood sugars over 200 at home Actos was continued along with his glipizide ER 10 mg instead of 20 and also metformin Last A1c 7.2  Current blood sugar patterns, problems identified and management:  His blood sugars are overall better especially recently including morning readings  He is however finding that he is feeling very full and has an uncomfortable feeling in his lower chest with taking the Trulicity especially the first 2 or 3 days after the injection.  Also getting constipated with this  He has tried to eat more on time and not late at night  With Trulicity he is cutting back on his portions significantly  His weight has finally started coming down a little  Does try to exercise at times, using some combination of weights and exercise bike       Oral hypoglycemic drugs the patient is taking are: glipizide ER 20 mg, metformin 1 g twice a day      Side effects from medications have been: None Compliance with the medical regimen: Fair Hypoglycemia: Rarely    Glucose monitoring:  done one time a day         Glucometer:  ?  One Touch ultra .      Blood Glucose readings   Mean values apply above for all meters except median for One Touch  PRE-MEAL  Fasting Lunch Dinner Bedtime Overall  Glucose range: 115-197  104-226  103-250  94-245    Mean/median: 152     149+/-39     Self-care:  Meals: 3 meals per day. Breakfast is oatmeal/cereal/eggs and toast.  Has sandwich or soup at lunch       Exercise: using bike, weight at times Dietician visit, most recent: at diagnosis               Weight history: 163-191  Wt Readings from Last 3 Encounters:  06/29/15 171 lb 12.8 oz (77.928 kg)  05/31/15 174 lb 3.2 oz (79.017 kg)  03/02/15 170 lb 12.8 oz (77.474 kg)    Glycemic control:   Lab Results  Component Value Date   HGBA1C 7.2* 05/25/2015   HGBA1C 6.8* 02/09/2015   HGBA1C 7.0* 08/16/2014   Lab Results  Component Value Date   MICROALBUR <0.7 05/25/2015   LDLCALC 112* 05/25/2015   CREATININE 1.32 05/25/2015       Medication List       This list is accurate as of: 06/29/15  9:59 AM.  Always use your most recent med list.               acyclovir 400 MG tablet  Commonly known as:  ZOVIRAX  TAKE 1 TABLET BY MOUTH 3 TIMES DAILY     aspirin 81 MG tablet  Take 81 mg by mouth daily.     atorvastatin 20 MG tablet  Commonly known as:  LIPITOR  TAKE 1 TABLET EVERY DAY     Dulaglutide 0.75 MG/0.5ML Sopn  Commonly known as:  TRULICITY  Inject in the abdominal skin as directed once a week     glipiZIDE 10 MG 24 hr tablet  Commonly known as:  GLUCOTROL XL  TAKE 2 TABLETS BY MOUTH EVERY DAY     glucose blood test strip  Commonly known as:  ONE TOUCH ULTRA TEST  Use as instructed     JANUVIA 100 MG tablet  Generic drug:  sitaGLIPtin  TAKE 1 TABLET (100 MG TOTAL) BY MOUTH DAILY.     levothyroxine 100 MCG tablet  Commonly known as:  SYNTHROID, LEVOTHROID  Take 1 tablet (100 mcg total) by mouth daily.     losartan 100 MG tablet  Commonly known as:  COZAAR  TAKE 1 TABLET BY MOUTH ONCE DAILY     metFORMIN 1000 MG tablet  Commonly known as:  GLUCOPHAGE  TAKE 1 TABLET BY MOUTH TWICE DAILY     pioglitazone 15 MG tablet    Commonly known as:  ACTOS  Take 1 tablet (15 mg total) by mouth daily.        Allergies: No Known Allergies  Past Medical History  Diagnosis Date  . Hypertension   . Depression   . Diabetes mellitus   . Hyperlipemia   . Fatigue   . Degenerative disc disease, lumbar   . Epicondylitis     bilateral  . ARTHRITIS, HIP 08/10/2008  . BURSITIS, RIGHT SHOULDER 05/20/2008  . Degeneration of lumbar or lumbosacral intervertebral disc 11/27/2006  . DEGENERATION, CERVICAL DISC 11/27/2006  . DEPRESSION 11/25/2006  . DIABETES MELLITUS, TYPE II 04/10/2007  . HERPES GENITALIS 09/17/2007  . HYPERLIPIDEMIA 03/31/2007  . HYPERTENSION 11/25/2006  . HYPOTHYROIDISM 10/28/2007  . Impaired fasting glucose 12/26/2006  . Lateral epicondylitis  of elbow 05/05/2007  . Sciatica 10/28/2007  . SHOULDER PAIN, RIGHT 08/18/2008    Past Surgical History  Procedure Laterality Date  . Herniated lumbar disc  1997, 2011  . Elbow surgery  2005    left  . Knee arthroscopy  2007    left  . Rotator cuff repair  1997    right  . Achilles tendon repair      left    Family History  Problem Relation Age of Onset  . Diabetes Mother   . Heart disease Mother   . Hypertension Mother   . Heart disease Father   . Colon cancer Neg Hx   . Diabetes Sister     Social History:  reports that he has never smoked. He has never used smokeless tobacco. He reports that he drinks about 1.8 oz of alcohol per week. He reports that he does not use illicit drugs.    Review of Systems   Home BP 120-140/75-85       Most recent eye exam was in  12/16       Lipids: He has been treated with Lipitor 20 mg but LDL is not at target.  Previously had been inconsistently compliant with this He has been treated by his PCP      Lab Results  Component Value Date   CHOL 183 05/25/2015   HDL 46.60 05/25/2015   LDLCALC 112* 05/25/2015  LDLDIRECT 126.0 05/12/2014   TRIG 119.0 05/25/2015   CHOLHDL 4 05/25/2015                   Thyroid:  He has been on Synthroid for the last few years, initially had symptoms of fatigue at diagnosis  Lab Results  Component Value Date   TSH 1.82 02/09/2015   TSH 2.20 12/31/2013   TSH 1.94 12/23/2012   Diabetic foot exam normal in 11/15   LABS:  No visits with results within 1 Week(s) from this visit. Latest known visit with results is:  Lab on 05/25/2015  Component Date Value Ref Range Status  . Hgb A1c MFr Bld 05/25/2015 7.2* 4.6 - 6.5 % Final   Glycemic Control Guidelines for People with Diabetes:Non Diabetic:  <6%Goal of Therapy: <7%Additional Action Suggested:  >8%   . Sodium 05/25/2015 139  135 - 145 mEq/L Final  . Potassium 05/25/2015 4.6  3.5 - 5.1 mEq/L Final  . Chloride 05/25/2015 103  96 - 112 mEq/L Final  . CO2 05/25/2015 31  19 - 32 mEq/L Final  . Glucose, Bld 05/25/2015 135* 70 - 99 mg/dL Final  . BUN 05/25/2015 17  6 - 23 mg/dL Final  . Creatinine, Ser 05/25/2015 1.32  0.40 - 1.50 mg/dL Final  . Total Bilirubin 05/25/2015 0.6  0.2 - 1.2 mg/dL Final  . Alkaline Phosphatase 05/25/2015 29* 39 - 117 U/L Final  . AST 05/25/2015 12  0 - 37 U/L Final  . ALT 05/25/2015 13  0 - 53 U/L Final  . Total Protein 05/25/2015 7.1  6.0 - 8.3 g/dL Final  . Albumin 05/25/2015 4.4  3.5 - 5.2 g/dL Final  . Calcium 05/25/2015 9.8  8.4 - 10.5 mg/dL Final  . GFR 05/25/2015 58.17* >60.00 mL/min Final  . Cholesterol 05/25/2015 183  0 - 200 mg/dL Final   ATP III Classification       Desirable:  < 200 mg/dL               Borderline High:  200 - 239 mg/dL          High:  > = 240 mg/dL  . Triglycerides 05/25/2015 119.0  0.0 - 149.0 mg/dL Final   Normal:  <150 mg/dLBorderline High:  150 - 199 mg/dL  . HDL 05/25/2015 46.60  >39.00 mg/dL Final  . VLDL 05/25/2015 23.8  0.0 - 40.0 mg/dL Final  . LDL Cholesterol 05/25/2015 112* 0 - 99 mg/dL Final  . Total CHOL/HDL Ratio 05/25/2015 4   Final                  Men          Women1/2 Average Risk     3.4          3.3Average Risk          5.0           4.42X Average Risk          9.6          7.13X Average Risk          15.0          11.0                      . NonHDL 05/25/2015 136.02   Final   NOTE:  Non-HDL goal should be 30 mg/dL higher than patient's LDL goal (i.e. LDL goal of < 70 mg/dL, would have non-HDL  goal of < 100 mg/dL)  . Microalb, Ur 05/25/2015 <0.7  0.0 - 1.9 mg/dL Final  . Creatinine,U 05/25/2015 113.4   Final  . Microalb Creat Ratio 05/25/2015 0.6  0.0 - 30.0 mg/g Final    Physical Examination:  BP 134/90 mmHg  Pulse 74  Temp(Src) 98 F (36.7 C)  Resp 14  Ht 5\' 5"  (1.651 m)  Wt 171 lb 12.8 oz (77.928 kg)  BMI 28.59 kg/m2  SpO2 92%             ASSESSMENT:  Diabetes type 2, nonobese.  See history of present illness for detailed discussion of his current management, blood sugar patterns and problems identified He is starting to see better blood sugars with adding Trulicity instead of Januvia He has lost a little weight and is finding that he has cut back on his portions However having significant GI distress and constipation with Trulicity [id number] even though he has done 3 injections Also able to reduce glipizide He does try to exercise although not enough   PLAN:   Discussed that he could try Tanzeum  instead of Trulicity but he wants to try not changing as yet.  Showed him how to use the Tanzeum pen and given him to start to get  Continue improving diet and exercise regimen  A1c on the next visit   Patient Instructions  Call if needing Tanzeum    [facility] 06/29/2015, 9:59 AM   Note: This office note was prepared with Dragon voice recognition system technology. Any transcriptional errors that result from this process are unintentional.

## 2015-06-29 NOTE — Patient Instructions (Signed)
Call if needing Tanzeum

## 2015-08-11 ENCOUNTER — Ambulatory Visit: Payer: 59 | Admitting: Internal Medicine

## 2015-08-11 DIAGNOSIS — Z0289 Encounter for other administrative examinations: Secondary | ICD-10-CM

## 2015-08-26 ENCOUNTER — Other Ambulatory Visit: Payer: 59

## 2015-08-29 ENCOUNTER — Ambulatory Visit: Payer: 59 | Admitting: Endocrinology

## 2015-09-12 ENCOUNTER — Other Ambulatory Visit: Payer: 59

## 2015-09-15 ENCOUNTER — Ambulatory Visit: Payer: 59 | Admitting: Endocrinology

## 2015-09-23 ENCOUNTER — Other Ambulatory Visit: Payer: Self-pay | Admitting: Internal Medicine

## 2015-09-27 ENCOUNTER — Other Ambulatory Visit: Payer: 59

## 2015-09-30 ENCOUNTER — Ambulatory Visit: Payer: 59 | Admitting: Endocrinology

## 2015-10-10 ENCOUNTER — Encounter: Payer: Self-pay | Admitting: Endocrinology

## 2015-11-11 ENCOUNTER — Other Ambulatory Visit: Payer: 59

## 2015-11-14 ENCOUNTER — Other Ambulatory Visit (INDEPENDENT_AMBULATORY_CARE_PROVIDER_SITE_OTHER): Payer: 59

## 2015-11-14 ENCOUNTER — Ambulatory Visit: Payer: 59 | Admitting: Endocrinology

## 2015-11-14 ENCOUNTER — Other Ambulatory Visit: Payer: 59

## 2015-11-14 DIAGNOSIS — E1065 Type 1 diabetes mellitus with hyperglycemia: Secondary | ICD-10-CM

## 2015-11-14 LAB — HEMOGLOBIN A1C: Hgb A1c MFr Bld: 6.7 % — ABNORMAL HIGH (ref 4.6–6.5)

## 2015-11-14 LAB — GLUCOSE, RANDOM: Glucose, Bld: 141 mg/dL — ABNORMAL HIGH (ref 70–99)

## 2015-11-17 ENCOUNTER — Ambulatory Visit (INDEPENDENT_AMBULATORY_CARE_PROVIDER_SITE_OTHER): Payer: 59 | Admitting: Endocrinology

## 2015-11-17 ENCOUNTER — Encounter: Payer: Self-pay | Admitting: Endocrinology

## 2015-11-17 VITALS — BP 138/90 | HR 76 | Temp 98.0°F | Resp 14 | Ht 65.0 in | Wt 174.8 lb

## 2015-11-17 DIAGNOSIS — E119 Type 2 diabetes mellitus without complications: Secondary | ICD-10-CM | POA: Diagnosis not present

## 2015-11-17 NOTE — Patient Instructions (Addendum)
Check blood sugars on waking up  2-3x per week  Also check blood sugars about 2 hours after a meal and do this after different meals by rotation  Recommended blood sugar levels on waking up is 90-130 and about 2 hours after meal is 130-160  Please bring your blood sugar monitor to each visit, thank you  More fiber in diet  [employer] meter

## 2015-11-17 NOTE — Progress Notes (Signed)
Patient ID: Ricardo [name], male   DOB: 1950/08/28, 65 y.o.   MRN: [id number]           Reason for Appointment: Follow-up for Type 2 Diabetes  Referring physician:  [name]   History of Present Illness:          Diagnosis: Type 2 diabetes mellitus, date of diagnosis: 2009       Past history: At diagnosis he was having symptoms of fatigue.  He was started on glipizide initially and subsequently metformin was added.  Further details are not available However he has been somewhat irregular with his follow-up and did not have an A1c with his PCP between 8/13 and 11/15 He was referred here because of rising A1c of 7.5  Recent history:   He had been started on Trulicity in [id number] because of gradually increasing A1c and periodic blood sugars over 200 at home Actos was continued along with his glipizide ER 10 mg and also metformin  His A1c is now 6.7, previously 7.2  Current blood sugar patterns, problems identified and management:  His blood sugars are overall better with improved A1c even though he says that he did not take Trulicity for a couple of months  He says he wanted to try OTC nutritional and herbal supplements such as cinnamon and he thinks these were helpful in bringing his blood sugar down  He has gone back to Trulicity nearly a month ago and he things that his GI symptoms are not as much on this trial  He has however gained some weight  Fasting blood sugars are somewhat variable  On his previous visit he was having spikes in his blood sugars over 200 occasionally  He says he does not consistently watch her diet with carbohydrates which may tend to increase his blood sugars including this week when it was 215 in the evening  He has only a couple of readings after meals at night  Does try to exercise at times, using some combination of weights and exercise bike       Oral hypoglycemic drugs the patient is taking are: glipizide ER 20 mg, metformin 1 g twice a day        Side effects from medications have been: None Compliance with the medical regimen: Fair Hypoglycemia: Rarely    Glucose monitoring:  done one time a day         Glucometer:  Accu-Chek and Generic  Blood Glucose readings   Mean values apply above for all meters except median for One Touch  PRE-MEAL Fasting Lunch PC Dinner Bedtime Overall  Glucose range: 105-162  167, 171  1 75-215     Mean/median: 135     143     Self-care:  Meals: 3 meals per day. Breakfast is oatmeal/cereal/eggs and toast.  Has sandwich or soup at lunch, avoiding rice and carbs usually      Exercise: using bike, weights at times  Dietician visit, most recent: at diagnosis               Weight history:   Wt Readings from Last 3 Encounters:  11/17/15 174 lb 12.8 oz (79.3 kg)  06/29/15 171 lb 12.8 oz (77.9 kg)  05/31/15 174 lb 3.2 oz (79 kg)    Glycemic control:   Lab Results  Component Value Date   HGBA1C 6.7 (H) 11/14/2015   HGBA1C 7.2 (H) 05/25/2015   HGBA1C 6.8 (H) 02/09/2015   Lab Results  Component Value Date  MICROALBUR <0.7 05/25/2015   LDLCALC 112 (H) 05/25/2015   CREATININE 1.32 05/25/2015   Appointment on 11/14/2015  Component Date Value Ref Range Status  . Hgb A1c MFr Bld 11/14/2015 6.7* 4.6 - 6.5 % Final  . Glucose, Bld 11/14/2015 141* 70 - 99 mg/dL Final       Medication List       Accurate as of 11/17/15 11:59 PM. Always use your most recent med list.          acyclovir 400 MG tablet Commonly known as:  ZOVIRAX TAKE 1 TABLET BY MOUTH 3 TIMES DAILY   aspirin 81 MG tablet Take 81 mg by mouth daily.   atorvastatin 20 MG tablet Commonly known as:  LIPITOR TAKE 1 TABLET EVERY DAY   Dulaglutide 0.75 MG/0.5ML Sopn Commonly known as:  TRULICITY Inject in the abdominal skin as directed once a week   glipiZIDE 10 MG 24 hr tablet Commonly known as:  GLUCOTROL XL TAKE 2 TABLETS BY MOUTH EVERY DAY   levothyroxine 100 MCG tablet Commonly known as:  SYNTHROID,  LEVOTHROID Take 1 tablet (100 mcg total) by mouth daily.   losartan 100 MG tablet Commonly known as:  COZAAR TAKE 1 TABLET BY MOUTH ONCE DAILY   metFORMIN 1000 MG tablet Commonly known as:  GLUCOPHAGE TAKE 1 TABLET TWICE A DAY   pioglitazone 15 MG tablet Commonly known as:  ACTOS Take 1 tablet (15 mg total) by mouth daily.       Allergies: No Known Allergies  Past Medical History:  Diagnosis Date  . ARTHRITIS, HIP 08/10/2008  . BURSITIS, RIGHT SHOULDER 05/20/2008  . Degeneration of lumbar or lumbosacral intervertebral disc 11/27/2006  . DEGENERATION, CERVICAL DISC 11/27/2006  . Degenerative disc disease, lumbar   . Depression   . DEPRESSION 11/25/2006  . Diabetes mellitus   . DIABETES MELLITUS, TYPE II 04/10/2007  . Epicondylitis    bilateral  . Fatigue   . HERPES GENITALIS 09/17/2007  . Hyperlipemia   . HYPERLIPIDEMIA 03/31/2007  . Hypertension   . HYPERTENSION 11/25/2006  . HYPOTHYROIDISM 10/28/2007  . Impaired fasting glucose 12/26/2006  . Lateral epicondylitis  of elbow 05/05/2007  . Sciatica 10/28/2007  . SHOULDER PAIN, RIGHT 08/18/2008    Past Surgical History:  Procedure Laterality Date  . ACHILLES TENDON REPAIR     left  . ELBOW SURGERY  2005   left  . herniated lumbar disc  1997, 2011  . KNEE ARTHROSCOPY  2007   left  . ROTATOR CUFF REPAIR  1997   right    Family History  Problem Relation Age of Onset  . Diabetes Mother   . Heart disease Mother   . Hypertension Mother   . Heart disease Father   . Colon cancer Neg Hx   . Diabetes Sister     Social History:  reports that he has never smoked. He has never used smokeless tobacco. He reports that he drinks about 1.8 oz of alcohol per week . He reports that he does not use drugs.    Review of Systems   Home BP 120-140/75-85       Most recent eye exam was in  12/16       Lipids: He has been treated with Lipitor 20 mg but LDL is not at target.  Previously had been inconsistently compliant with this He  has been treated by his PCP      Lab Results  Component Value Date   CHOL 183 05/25/2015  HDL 46.60 05/25/2015   LDLCALC 112 (H) 05/25/2015   LDLDIRECT 126.0 05/12/2014   TRIG 119.0 05/25/2015   CHOLHDL 4 05/25/2015                   Thyroid: He has been on Synthroid for the last few years, initially had symptoms of fatigue at diagnosis  Lab Results  Component Value Date   TSH 1.82 02/09/2015   TSH 2.20 12/31/2013   TSH 1.94 12/23/2012   Diabetic foot exam normal in     LABS:  Appointment on 11/14/2015  Component Date Value Ref Range Status  . Hgb A1c MFr Bld 11/14/2015 6.7* 4.6 - 6.5 % Final  . Glucose, Bld 11/14/2015 141* 70 - 99 mg/dL Final    Physical Examination:  BP 138/90   Pulse 76   Temp 98 F (36.7 C)   Resp 14   Ht 5\' 5"  (1.651 m)   Wt 174 lb 12.8 oz (79.3 kg)   SpO2 97%   BMI 29.09 kg/m              ASSESSMENT:  Diabetes type 2, nonobese.  See history of present illness for detailed discussion of his current management, blood sugar patterns and problems identified  Although he has been irregular with his Trulicity because of GI side effects his A1c is better He thinks supplements such as cinnamon help his blood sugar control However he is tolerating Trulicity better now and agrees to continue this especially to keep his weight down and help with portion control He does try to exercise although not enough He has not done enough readings after meals and discussed need to do this more often especially to help him modify his diet   PLAN:   Continue same regimen  He continues the [employer] brand meter as he says that he cannot afford the test test for his Accu-Chek  More readings after meals  He will need to follow-up with his PCP for hypertension  Patient Instructions  Check blood sugars on waking up  2-3x per week  Also check blood sugars about 2 hours after a meal and do this after different meals by rotation  Recommended blood sugar  levels on waking up is 90-130 and about 2 hours after meal is 130-160  Please bring your blood sugar monitor to each visit, thank you  More fiber in diet  [employer] meter          Samuella Rasool 11/18/2015, 9:10 AM   Note: This office note was prepared with Dragon voice recognition system technology. Any transcriptional errors that result from this process are unintentional.

## 2015-11-22 ENCOUNTER — Telehealth: Payer: Self-pay | Admitting: Internal Medicine

## 2015-11-22 ENCOUNTER — Other Ambulatory Visit: Payer: Self-pay | Admitting: *Deleted

## 2015-11-22 MED ORDER — METFORMIN HCL 1000 MG PO TABS
1000.0000 mg | ORAL_TABLET | Freq: Two times a day (BID) | ORAL | 1 refills | Status: DC
Start: 2015-11-22 — End: 2017-01-24

## 2015-11-22 MED ORDER — ACYCLOVIR 400 MG PO TABS: 400.0000 mg | ORAL_TABLET | Freq: Three times a day (TID) | ORAL | 1 refills | Status: DC

## 2015-11-22 MED ORDER — PIOGLITAZONE HCL 15 MG PO TABS: 15.0000 mg | ORAL_TABLET | Freq: Every day | ORAL | 1 refills | Status: DC

## 2015-11-22 MED ORDER — GLIPIZIDE ER 10 MG PO TB24: 10.0000 mg | ORAL_TABLET | Freq: Every day | ORAL | 1 refills | Status: DC

## 2015-11-22 NOTE — Telephone Encounter (Signed)
Patient states that script for acyclovir was received by [facility].  Patient is requesting script for valtrex to be sent to [facility] in [place] instead.  Patient wants to try this medication.

## 2015-11-22 NOTE — Telephone Encounter (Signed)
Pt left vm req Refills on glipizide, metformin, valtrex and pioglitazone to be sent to his pharmacy in [place]. Done. See meds.

## 2015-11-23 NOTE — Telephone Encounter (Signed)
I called pt- he is requesting Valtrex instead of acyclovir. Ok to switch and send a new Rx to [facility]?

## 2015-11-25 NOTE — Telephone Encounter (Signed)
Left detailed mess informing pt of below.  

## 2015-11-25 NOTE — Telephone Encounter (Signed)
Cont w/Acyclovir We can duiscuss Valtrex at his next OV Thx

## 2015-12-01 ENCOUNTER — Telehealth: Payer: Self-pay | Admitting: Internal Medicine

## 2015-12-01 NOTE — Telephone Encounter (Signed)
Ricardo [name] patient called you back. Can you please follow up with him. Thank you. He states you informed him to call you back.

## 2015-12-02 ENCOUNTER — Other Ambulatory Visit: Payer: Self-pay

## 2015-12-02 MED ORDER — PIOGLITAZONE HCL 15 MG PO TABS: 15.0000 mg | ORAL_TABLET | Freq: Every day | ORAL | 2 refills | Status: DC

## 2015-12-02 MED ORDER — GLIPIZIDE ER 10 MG PO TB24: 10.0000 mg | ORAL_TABLET | Freq: Every day | ORAL | 2 refills | Status: DC

## 2015-12-02 NOTE — Telephone Encounter (Signed)
I spoke to him once by phone and in person yesterday regarding changing his medication. Per PCP he needs OV. Please call to schedule f/u with PCP. Thanks

## 2015-12-02 NOTE — Telephone Encounter (Signed)
LVM for patient to call back and make an app.

## 2015-12-05 NOTE — Telephone Encounter (Signed)
Pt called back. I let him know PCP would like him to make an OV. He stated he doesn't need or want to make an appt for medication change. No appt made thanks.

## 2015-12-13 ENCOUNTER — Other Ambulatory Visit: Payer: Self-pay | Admitting: Endocrinology

## 2015-12-14 ENCOUNTER — Other Ambulatory Visit: Payer: Self-pay | Admitting: *Deleted

## 2015-12-14 MED ORDER — SITAGLIPTIN PHOSPHATE 100 MG PO TABS: ORAL_TABLET | ORAL | 1 refills | Status: DC

## 2016-01-27 ENCOUNTER — Encounter: Payer: Self-pay | Admitting: Family

## 2016-01-27 ENCOUNTER — Ambulatory Visit (INDEPENDENT_AMBULATORY_CARE_PROVIDER_SITE_OTHER): Payer: 59 | Admitting: Family

## 2016-01-27 VITALS — BP 152/94 | HR 86 | Temp 97.7°F | Ht 65.0 in | Wt 172.8 lb

## 2016-01-27 DIAGNOSIS — M542 Cervicalgia: Secondary | ICD-10-CM

## 2016-01-27 MED ORDER — METHYLPREDNISOLONE ACETATE 80 MG/ML IJ SUSP
80.0000 mg | Freq: Once | INTRAMUSCULAR | Status: AC
  Administered 2016-01-27: 80 mg via INTRAMUSCULAR

## 2016-01-27 MED ORDER — KETOROLAC TROMETHAMINE 60 MG/2ML IM SOLN
60.0000 mg | Freq: Once | INTRAMUSCULAR | Status: AC
  Administered 2016-01-27: 60 mg via INTRAMUSCULAR

## 2016-01-27 MED ORDER — CYCLOBENZAPRINE HCL 10 MG PO TABS: 10.0000 mg | ORAL_TABLET | Freq: Three times a day (TID) | ORAL | 0 refills | Status: DC | PRN

## 2016-01-27 NOTE — Progress Notes (Addendum)
Subjective:    Patient ID: Ricardo [name], male    DOB: 1950/08/12, 65 y.o.   MRN: [id number]  Chief Complaint  Patient presents with  . Neck Pain    pain after using the computer Tue. afternoon, thinks he was too far from the computer desk, pain scale 6 on pain scale,muscle relaxers seem to alleviate,     HPI:  Ricardo [name] is a 64 y.o. male who  has a past medical history of ARTHRITIS, HIP (08/10/2008); BURSITIS, RIGHT SHOULDER (05/20/2008); Degeneration of lumbar or lumbosacral intervertebral disc (11/27/2006); DEGENERATION, CERVICAL DISC (11/27/2006); Degenerative disc disease, lumbar; Depression; DEPRESSION (11/25/2006); Diabetes mellitus; DIABETES MELLITUS, TYPE II (04/10/2007); Epicondylitis; Fatigue; HERPES GENITALIS (09/17/2007); Hyperlipemia; HYPERLIPIDEMIA (03/31/2007); Hypertension; HYPERTENSION (11/25/2006); HYPOTHYROIDISM (10/28/2007); Impaired fasting glucose (12/26/2006); Lateral epicondylitis  of elbow (05/05/2007); Sciatica (10/28/2007); and SHOULDER PAIN, RIGHT (08/18/2008). and presents today For an acute office visit.  This is a new problem. Associated symptom of pain and stiffness located in his neck has been going on for about 3 days. Describes working at his computer and may have been further away from his desk then he should have. No trauma or injury. No radiculopathy. Pain is described as sharp, dull, achy and tight. Decreased range of motion in rotation bilaterally. Modifying factors include cyclobenzaprine which has helped with his symptoms. Has had a stiff neck previously. No headaches.   No Known Allergies    Outpatient Medications Prior to Visit  Medication Sig Dispense Refill  . acyclovir (ZOVIRAX) 400 MG tablet Take 1 tablet (400 mg total) by mouth 3 (three) times daily. 270 tablet 1  . aspirin 81 MG tablet Take 81 mg by mouth daily.      [name] atorvastatin (LIPITOR) 20 MG tablet TAKE 1 TABLET EVERY DAY 90 tablet 3  . glipiZIDE (GLUCOTROL XL) 10 MG 24 hr tablet Take 1  tablet (10 mg total) by mouth daily. 60 tablet 2  . levothyroxine (SYNTHROID, LEVOTHROID) 100 MCG tablet Take 1 tablet (100 mcg total) by mouth daily. 90 tablet 3  . losartan (COZAAR) 100 MG tablet TAKE 1 TABLET BY MOUTH ONCE DAILY 90 tablet 1  . metFORMIN (GLUCOPHAGE) 1000 MG tablet Take 1 tablet (1,000 mg total) by mouth 2 (two) times daily. 180 tablet 1  . pioglitazone (ACTOS) 15 MG tablet Take 1 tablet (15 mg total) by mouth daily. 30 tablet 2  . sitaGLIPtin (JANUVIA) 100 MG tablet TAKE 1 TABLET (100 MG TOTAL) BY MOUTH DAILY. 90 tablet 1  . Dulaglutide (TRULICITY) [id number] [id number] SOPN Inject in the abdominal skin as directed once a week (Patient not taking: Reported on 01/27/2016) 4 pen 0   No facility-administered medications prior to visit.       Past Surgical History:  Procedure Laterality Date  . ACHILLES TENDON REPAIR     left  . ELBOW SURGERY  2005   left  . herniated lumbar disc  1997, 2011  . KNEE ARTHROSCOPY  2007   left  . [place]   right      Past Medical History:  Diagnosis Date  . ARTHRITIS, HIP 08/10/2008  . BURSITIS, RIGHT SHOULDER 05/20/2008  . Degeneration of lumbar or lumbosacral intervertebral disc 11/27/2006  . DEGENERATION, CERVICAL DISC 11/27/2006  . Degenerative disc disease, lumbar   . Depression   . DEPRESSION 11/25/2006  . Diabetes mellitus   . DIABETES MELLITUS, TYPE II 04/10/2007  . Epicondylitis    bilateral  . Fatigue   . HERPES GENITALIS 09/17/2007  .  Hyperlipemia   . HYPERLIPIDEMIA 03/31/2007  . Hypertension   . HYPERTENSION 11/25/2006  . HYPOTHYROIDISM 10/28/2007  . Impaired fasting glucose 12/26/2006  . Lateral epicondylitis  of elbow 05/05/2007  . Sciatica 10/28/2007  . SHOULDER PAIN, RIGHT 08/18/2008      Review of Systems  Constitutional: Negative for chills and fever.  Respiratory: Negative for chest tightness and shortness of breath.   Musculoskeletal: Positive for neck pain and neck stiffness.  Neurological:  Negative for weakness and numbness.      Objective:    BP (!) 152/94 (BP Location: Right Arm, Patient Position: Sitting, Cuff Size: Large)   Pulse 86   Temp 97.7 F (36.5 C) (Oral)   Ht 5\' 5"  (1.651 m)   Wt 172 lb 12.8 oz (78.4 kg)   SpO2 94%   BMI 28.76 kg/m  Nursing note and vital signs reviewed.  Physical Exam  Constitutional: He is oriented to person, place, and time. He appears well-developed and well-nourished. No distress.  Neck:  No obvious deformity, discoloration, or edema. Palpable muscle spasm of the bilateral upper trapezius. No cervical spine tenderness. Range of motion restricted in flexion, lateral bending, and rotation. Upper extremity pulses and sensation are intact and appropriate.  Cardiovascular: Normal rate, regular rhythm, normal heart sounds and intact distal pulses.   Pulmonary/Chest: Effort normal and breath sounds normal.  Neurological: He is alert and oriented to person, place, and time.  Skin: Skin is warm and dry.  Psychiatric: He has a normal mood and affect. His behavior is normal. Judgment and thought content normal.       Assessment & Plan:   Problem List Items Addressed This Visit      Other   Neck pain - Primary    Symptoms and exam consistent with cervical paraspinal muscle spasms of nontraumatic origin. In office injection of Depo-Medrol and Toradol provided. Start cyclobenzaprine. Recommend ice, moist heat, home exercise therapy. If symptoms worsen or do not improve consider imaging and/or physical therapy.      Relevant Medications   methylPREDNISolone acetate (DEPO-MEDROL) injection 80 mg (Completed)   ketorolac (TORADOL) injection 60 mg (Completed)       I am having Mr. Babicz start on cyclobenzaprine. I am also having him maintain his aspirin, levothyroxine, losartan, atorvastatin, Dulaglutide, metFORMIN, acyclovir, glipiZIDE, pioglitazone, and sitaGLIPtin. We administered methylPREDNISolone acetate and ketorolac.   Meds  ordered this encounter  Medications  . cyclobenzaprine (FLEXERIL) 10 MG tablet    Sig: Take 1 tablet (10 mg total) by mouth 3 (three) times daily as needed for muscle spasms.    Dispense:  30 tablet    Refill:  0    Order Specific Question:   Supervising Provider    Answer:   Pricilla Holm A [id number]  . methylPREDNISolone acetate (DEPO-MEDROL) injection 80 mg  . ketorolac (TORADOL) injection 60 mg     Follow-up: Return if symptoms worsen or fail to improve.  Mauricio Po, FNP

## 2016-01-27 NOTE — Patient Instructions (Addendum)
Thank you for choosing [facility].  SUMMARY AND INSTRUCTIONS:  Ice/moist heat x 20 minutes every 2 hours and after activity.  Stretches and exercises daily  Cyclobenzaprine as needed for muscle spasm.   Medication:  Your prescription(s) have been submitted to your pharmacy or been printed and provided for you. Please take as directed and contact our office if you believe you are having problem(s) with the medication(s) or have any questions.  Follow up:  If your symptoms worsen or fail to improve, please contact our office for further instruction, or in case of emergency go directly to the emergency room at the closest medical facility.    Cervical Strain and Sprain Rehab Ask your health care provider which exercises are safe for you. Do exercises exactly as told by your health care provider and adjust them as directed. It is normal to feel mild stretching, pulling, tightness, or discomfort as you do these exercises, but you should stop right away if you feel sudden pain or your pain gets worse.Do not begin these exercises until told by your health care provider. Stretching and range of motion exercises These exercises warm up your muscles and joints and improve the movement and flexibility of your neck. These exercises also help to relieve pain, numbness, and tingling. Exercise A: Cervical side bend 1. Using good posture, sit on a stable chair or stand up. 2. Without moving your shoulders, slowly tilt your left / right ear to your shoulder until you feel a stretch in your neck muscles. You should be looking straight ahead. 3. Hold for __________ seconds. 4. Repeat with the other side of your neck. Repeat __________ times. Complete this exercise __________ times a day. Exercise B: Cervical rotation 1. Using good posture, sit on a stable chair or stand up. 2. Slowly turn your head to the side as if you are looking over your left / right shoulder.  Keep your eyes level with  the ground.  Stop when you feel a stretch along the side and the back of your neck. 3. Hold for __________ seconds. 4. Repeat this by turning to your other side. Repeat __________ times. Complete this exercise __________ times a day. Exercise C: Thoracic extension and pectoral stretch 1. Roll a towel or a small blanket so it is about 4 inches (10 cm) in diameter. 2. Lie down on your back on a firm surface. 3. Put the towel lengthwise, under your spine in the middle of your back. It should not be not under your shoulder blades. The towel should line up with your spine from your middle back to your lower back. 4. Put your hands behind your head and let your elbows fall out to your sides. 5. Hold for __________ seconds. Repeat __________ times. Complete this exercise __________ times a day. Strengthening exercises These exercises build strength and endurance in your neck. Endurance is the ability to use your muscles for a long time, even after your muscles get tired. Exercise D: Upper cervical flexion, isometric 1. Lie on your back with a thin pillow behind your head and a small rolled-up towel under your neck. 2. Gently tuck your chin toward your chest and nod your head down to look toward your feet. Do not lift your head off the pillow. 3. Hold for __________ seconds. 4. Release the tension slowly. Relax your neck muscles completely before you repeat this exercise. Repeat __________ times. Complete this exercise __________ times a day. Exercise E: Cervical extension, isometric 1. Stand about 6  inches (15 cm) away from a wall, with your back facing the wall. 2. Place a soft object, about 6-8 inches (15-20 cm) in diameter, between the back of your head and the wall. A soft object could be a small pillow, a ball, or a folded towel. 3. Gently tilt your head back and press into the soft object. Keep your jaw and forehead relaxed. 4. Hold for __________ seconds. 5. Release the tension slowly.  Relax your neck muscles completely before you repeat this exercise. Repeat __________ times. Complete this exercise __________ times a day. Posture and body mechanics   Body mechanics refers to the movements and positions of your body while you do your daily activities. Posture is part of body mechanics. Good posture and healthy body mechanics can help to relieve stress in your body's tissues and joints. Good posture means that your spine is in its natural S-curve position (your spine is neutral), your shoulders are pulled back slightly, and your head is not tipped forward. The following are general guidelines for applying improved posture and body mechanics to your everyday activities. Standing  When standing, keep your spine neutral and keep your feet about hip-width apart. Keep a slight bend in your knees. Your ears, shoulders, and hips should line up.  When you do a task in which you stand in one place for a long time, place one foot up on a stable object that is 2-4 inches (5-10 cm) high, such as a footstool. This helps keep your spine neutral. Sitting  When sitting, keep your spine neutral and your keep feet flat on the floor. Use a footrest, if necessary, and keep your thighs parallel to the floor. Avoid rounding your shoulders, and avoid tilting your head forward.  When working at a desk or a computer, keep your desk at a height where your hands are slightly lower than your elbows. Slide your chair under your desk so you are close enough to maintain good posture.  When working at a computer, place your monitor at a height where you are looking straight ahead and you do not have to tilt your head forward or downward to look at the screen. Resting When lying down and resting, avoid positions that are most painful for you. Try to support your neck in a neutral position. You can use a contour pillow or a small rolled-up towel. Your pillow should support your neck but not push on it. This  information is not intended to replace advice given to you by your health care provider. Make sure you discuss any questions you have with your health care provider. Document Released: 02/12/2005 Document Revised: 10/20/2015 Document Reviewed: 01/19/2015 Elsevier Interactive Patient Education  2017 [employer].

## 2016-01-27 NOTE — Assessment & Plan Note (Signed)
Symptoms and exam consistent with cervical paraspinal muscle spasms of nontraumatic origin. In office injection of Depo-Medrol and Toradol provided. Start cyclobenzaprine. Recommend ice, moist heat, home exercise therapy. If symptoms worsen or do not improve consider imaging and/or physical therapy.

## 2016-02-13 ENCOUNTER — Ambulatory Visit (INDEPENDENT_AMBULATORY_CARE_PROVIDER_SITE_OTHER): Payer: 59 | Admitting: Internal Medicine

## 2016-02-13 ENCOUNTER — Other Ambulatory Visit (INDEPENDENT_AMBULATORY_CARE_PROVIDER_SITE_OTHER): Payer: 59

## 2016-02-13 ENCOUNTER — Other Ambulatory Visit: Payer: Self-pay | Admitting: Internal Medicine

## 2016-02-13 ENCOUNTER — Encounter: Payer: Self-pay | Admitting: Internal Medicine

## 2016-02-13 ENCOUNTER — Other Ambulatory Visit: Payer: 59

## 2016-02-13 VITALS — BP 140/90 | HR 78 | Ht 65.0 in | Wt 168.0 lb

## 2016-02-13 DIAGNOSIS — N320 Bladder-neck obstruction: Secondary | ICD-10-CM

## 2016-02-13 DIAGNOSIS — E034 Atrophy of thyroid (acquired): Secondary | ICD-10-CM | POA: Diagnosis not present

## 2016-02-13 DIAGNOSIS — E119 Type 2 diabetes mellitus without complications: Secondary | ICD-10-CM

## 2016-02-13 DIAGNOSIS — R972 Elevated prostate specific antigen [PSA]: Secondary | ICD-10-CM

## 2016-02-13 DIAGNOSIS — Z0000 Encounter for general adult medical examination without abnormal findings: Secondary | ICD-10-CM

## 2016-02-13 LAB — BASIC METABOLIC PANEL
BUN: 15 mg/dL (ref 6–23)
CALCIUM: 9.9 mg/dL (ref 8.4–10.5)
CHLORIDE: 100 meq/L (ref 96–112)
CO2: 33 meq/L — AB (ref 19–32)
Creatinine, Ser: 1.14 mg/dL (ref 0.40–1.50)
GFR: 68.74 mL/min (ref >60.00)
Glucose, Bld: 151 mg/dL — ABNORMAL HIGH (ref 70–99)
Potassium: 4.6 mEq/L (ref 3.5–5.1)
SODIUM: 140 meq/L (ref 135–145)

## 2016-02-13 LAB — URINALYSIS
BILIRUBIN URINE: NEGATIVE
HGB URINE DIPSTICK: NEGATIVE
KETONES UR: NEGATIVE
LEUKOCYTES UA: NEGATIVE
Nitrite: NEGATIVE
Specific Gravity, Urine: 1.015 (ref 1.000–1.030)
Total Protein, Urine: NEGATIVE
UROBILINOGEN UA: 0.2 (ref 0.0–1.0)
Urine Glucose: NEGATIVE
pH: 6.5 (ref 5.0–8.0)

## 2016-02-13 LAB — CBC WITH DIFFERENTIAL/PLATELET
BASOS PCT: 0.6 % (ref 0.0–3.0)
Basophils Absolute: 0.1 10*3/uL (ref 0.0–0.1)
Eosinophils Absolute: 0.2 10*3/uL (ref 0.0–0.7)
Eosinophils Relative: 2.7 % (ref 0.0–5.0)
HCT: 43.1 % (ref 39.0–52.0)
HEMOGLOBIN: 14.5 g/dL (ref 13.0–17.0)
Lymphocytes Relative: 22.3 % (ref 12.0–46.0)
Lymphs Abs: 2 10*3/uL (ref 0.7–4.0)
MCHC: 33.7 g/dL (ref 30.0–36.0)
MCV: 89.3 fl (ref 78.0–100.0)
MONO ABS: 0.7 10*3/uL (ref 0.1–1.0)
Monocytes Relative: 8.3 % (ref 3.0–12.0)
NEUTROS ABS: 5.8 10*3/uL (ref 1.4–7.7)
Neutrophils Relative %: 66.1 % (ref 43.0–77.0)
Platelets: 231 10*3/uL (ref 150.0–400.0)
RBC: 4.82 Mil/uL (ref 4.22–5.81)
RDW: 13.3 % (ref 11.5–15.5)
WBC: 8.8 10*3/uL (ref 4.0–10.5)

## 2016-02-13 LAB — PSA: PSA: 4.81 ng/mL — AB (ref 0.10–4.00)

## 2016-02-13 LAB — LIPID PANEL
CHOLESTEROL: 173 mg/dL (ref 0–200)
HDL: 54.9 mg/dL (ref >39.00)
LDL Cholesterol: 92 mg/dL (ref 0–99)
NonHDL: 117.69
Total CHOL/HDL Ratio: 3
Triglycerides: 127 mg/dL (ref 0.0–149.0)
VLDL: 25.4 mg/dL (ref 0.0–40.0)

## 2016-02-13 LAB — HEPATIC FUNCTION PANEL
ALBUMIN: 4.6 g/dL (ref 3.5–5.2)
ALT: 12 U/L (ref 0–53)
AST: 10 U/L (ref 0–37)
Alkaline Phosphatase: 35 U/L — ABNORMAL LOW (ref 39–117)
Bilirubin, Direct: 0.1 mg/dL (ref 0.0–0.3)
Total Bilirubin: 0.7 mg/dL (ref 0.2–1.2)
Total Protein: 6.8 g/dL (ref 6.0–8.3)

## 2016-02-13 LAB — MICROALBUMIN / CREATININE URINE RATIO
CREATININE, U: 129.4 mg/dL
Microalb Creat Ratio: 0.5 mg/g (ref 0.0–30.0)

## 2016-02-13 LAB — TSH: TSH: 2.13 u[IU]/mL (ref 0.35–4.50)

## 2016-02-13 LAB — HEMOGLOBIN A1C: HEMOGLOBIN A1C: 7.4 % — AB (ref 4.6–6.5)

## 2016-02-13 MED ORDER — TERAZOSIN HCL 1 MG PO CAPS: 1.0000 mg | ORAL_CAPSULE | Freq: Every day | ORAL | 3 refills | Status: DC

## 2016-02-13 NOTE — Assessment & Plan Note (Signed)
Start Hytrin

## 2016-02-13 NOTE — Assessment & Plan Note (Signed)
Labs

## 2016-02-13 NOTE — Progress Notes (Signed)
Pre visit review using our clinic review tool, if applicable. No additional management support is needed unless otherwise documented below in the visit note. 

## 2016-02-13 NOTE — Assessment & Plan Note (Addendum)
We discussed age appropriate health related issues, including available/recomended screening tests and vaccinations. We discussed a need for adhering to healthy diet and exercise. Labs ordered. All questions were answered.  Colon due 2024

## 2016-02-13 NOTE — Progress Notes (Signed)
Subjective:  Patient ID: Ricardo [name], male    DOB: [birth year]  Age: 65 y.o. MRN: [id number]  CC: Annual Exam   HPI Skippy Rohn presents for HTN, dyslipidemia, DM2 f/u  Outpatient Medications Prior to Visit  Medication Sig Dispense Refill  . acyclovir (ZOVIRAX) 400 MG tablet Take 1 tablet (400 mg total) by mouth 3 (three) times daily. 270 tablet 1  . aspirin 81 MG tablet Take 81 mg by mouth daily.      [name] atorvastatin (LIPITOR) 20 MG tablet TAKE 1 TABLET EVERY DAY 90 tablet 3  . cyclobenzaprine (FLEXERIL) 10 MG tablet Take 1 tablet (10 mg total) by mouth 3 (three) times daily as needed for muscle spasms. 30 tablet 0  . glipiZIDE (GLUCOTROL XL) 10 MG 24 hr tablet Take 1 tablet (10 mg total) by mouth daily. 60 tablet 2  . levothyroxine (SYNTHROID, LEVOTHROID) 100 MCG tablet Take 1 tablet (100 mcg total) by mouth daily. 90 tablet 3  . losartan (COZAAR) 100 MG tablet TAKE 1 TABLET BY MOUTH ONCE DAILY 90 tablet 1  . metFORMIN (GLUCOPHAGE) 1000 MG tablet Take 1 tablet (1,000 mg total) by mouth 2 (two) times daily. 180 tablet 1  . pioglitazone (ACTOS) 15 MG tablet Take 1 tablet (15 mg total) by mouth daily. 30 tablet 2  . sitaGLIPtin (JANUVIA) 100 MG tablet TAKE 1 TABLET (100 MG TOTAL) BY MOUTH DAILY. 90 tablet 1  . Dulaglutide (TRULICITY) A999333 0000000 SOPN Inject in the abdominal skin as directed once a week (Patient not taking: Reported on 02/13/2016) 4 pen 0   No facility-administered medications prior to visit.     ROS Review of Systems  Constitutional: Negative for appetite change, fatigue and unexpected weight change.  HENT: Negative for congestion, nosebleeds, sneezing, sore throat and trouble swallowing.   Eyes: Negative for itching and visual disturbance.  Respiratory: Negative for cough.   Cardiovascular: Negative for chest pain, palpitations and leg swelling.  Gastrointestinal: Negative for abdominal distention, blood in stool, diarrhea and nausea.  Genitourinary:  Positive for frequency and urgency. Negative for hematuria.  Musculoskeletal: Negative for back pain, gait problem, joint swelling and neck pain.  Skin: Negative for rash.  Neurological: Negative for dizziness, tremors, speech difficulty and weakness.  Psychiatric/Behavioral: Negative for agitation, dysphoric mood and sleep disturbance. The patient is not nervous/anxious.     Objective:  BP 140/90   Pulse 78   Ht 5\' 5"  (1.651 m)   Wt 168 lb (76.2 kg)   SpO2 98%   BMI 27.96 kg/m   BP Readings from Last 3 Encounters:  02/13/16 140/90  01/27/16 (!) 152/94  11/17/15 138/90    Wt Readings from Last 3 Encounters:  02/13/16 168 lb (76.2 kg)  01/27/16 172 lb 12.8 oz (78.4 kg)  11/17/15 174 lb 12.8 oz (79.3 kg)    Physical Exam  Constitutional: He is oriented to person, place, and time. He appears well-developed. No distress.  NAD  HENT:  Mouth/Throat: Oropharynx is clear and moist.  Eyes: Conjunctivae are normal. Pupils are equal, round, and reactive to light.  Neck: Normal range of motion. No JVD present. No thyromegaly present.  Cardiovascular: Normal rate, regular rhythm, normal heart sounds and intact distal pulses.  Exam reveals no gallop and no friction rub.   No murmur heard. Pulmonary/Chest: Effort normal and breath sounds normal. No respiratory distress. He has no wheezes. He has no rales. He exhibits no tenderness.  Abdominal: Soft. Bowel sounds are normal. He exhibits no distension  and no mass. There is no tenderness. There is no rebound and no guarding.  Genitourinary: Prostate normal. Rectal exam shows guaiac negative stool.  Musculoskeletal: Normal range of motion. He exhibits no edema or tenderness.  Lymphadenopathy:    He has no cervical adenopathy.  Neurological: He is alert and oriented to person, place, and time. He has normal reflexes. No cranial nerve deficit. He exhibits normal muscle tone. He displays a negative Romberg sign. Coordination and gait normal.    Skin: Skin is warm and dry. No rash noted.  Psychiatric: He has a normal mood and affect. His behavior is normal. Judgment and thought content normal.    Lab Results  Component Value Date   WBC 7.3 12/31/2013   HGB 13.9 12/31/2013   HCT 42.4 12/31/2013   PLT 237.0 12/31/2013   GLUCOSE 141 (H) 11/14/2015   CHOL 183 05/25/2015   TRIG 119.0 05/25/2015   HDL 46.60 05/25/2015   LDLDIRECT 126.0 05/12/2014   LDLCALC 112 (H) 05/25/2015   ALT 13 05/25/2015   AST 12 05/25/2015   NA 139 05/25/2015   K 4.6 05/25/2015   CL 103 05/25/2015   CREATININE 1.32 05/25/2015   BUN 17 05/25/2015   CO2 31 05/25/2015   TSH 1.82 02/09/2015   PSA 2.15 02/09/2015   INR 1.0 04/02/2007   HGBA1C 6.7 (H) 11/14/2015   MICROALBUR <0.7 05/25/2015    Dg Cervical Spine Complete  Result Date: 11/28/2014 CLINICAL DATA:  Neck pain and stiffness which began this morning EXAM: CERVICAL SPINE  4+ VIEWS COMPARISON:  None. FINDINGS: Straightening of the cervical lordosis. No fracture. No prevertebral soft tissue swelling. There is mild degenerative disc disease at C3-4, C4-5, C5-6, and C6-7. There is C4-5 and C5-6 moderate foraminal narrowing, with mild foraminal narrowing above and below this level, bilaterally. Mild left carotid calcification. IMPRESSION: Degenerative changes with no acute findings Electronically Signed   By: Skipper Cliche M.D.   On: 11/28/2014 17:49    Assessment & Plan:   There are no diagnoses linked to this encounter. I am having Mr. Diallo maintain his aspirin, levothyroxine, losartan, atorvastatin, Dulaglutide, metFORMIN, acyclovir, glipiZIDE, pioglitazone, sitaGLIPtin, and cyclobenzaprine.  No orders of the defined types were placed in this encounter.    Follow-up: No Follow-up on file.  Walker Kehr, MD

## 2016-02-15 ENCOUNTER — Encounter: Payer: Self-pay | Admitting: Internal Medicine

## 2016-02-15 ENCOUNTER — Other Ambulatory Visit: Payer: Self-pay | Admitting: Emergency Medicine

## 2016-02-15 NOTE — Telephone Encounter (Signed)
Pt called and stated he talked with Dr Camila Li about being prescribed Valtrex and he would like to go ahead and try that. Also that his wife forgot to mention she is having symptoms of a UTI. He is wondering if he can bring in a urine sample to be tested so if she does she can get some medication. Please advise thanks.

## 2016-02-15 NOTE — Telephone Encounter (Signed)
Patient is requesting a new script for valtrex. He is asking that it be printed out, because he is going to submit it to an [facility], [facility].

## 2016-02-15 NOTE — Telephone Encounter (Signed)
Ok - see Meds Thx

## 2016-02-15 NOTE — Addendum Note (Signed)
Addended by: [name] on: 02/15/2016 11:53 PM   Modules accepted: Orders

## 2016-02-16 ENCOUNTER — Other Ambulatory Visit: Payer: Self-pay | Admitting: Geriatric Medicine

## 2016-02-16 MED ORDER — VALACYCLOVIR HCL 500 MG PO TABS: 500.0000 mg | ORAL_TABLET | Freq: Every day | ORAL | 3 refills | Status: DC

## 2016-02-16 NOTE — Telephone Encounter (Signed)
MD printed script place up front for pick-up...Johny Chess

## 2016-02-16 NOTE — Telephone Encounter (Signed)
Please print a script for valtrex- patient will pick up to submit to an online pharmacy

## 2016-03-07 ENCOUNTER — Other Ambulatory Visit: Payer: Self-pay | Admitting: Endocrinology

## 2016-03-18 NOTE — Progress Notes (Signed)
Patient ID: Ricardo [name], male   DOB: 1950-08-01, 66 y.o.   MRN: [id number]           Reason for Appointment: Follow-up for Type 2 Diabetes  Referring physician:  [name]   History of Present Illness:          Diagnosis: Type 2 diabetes mellitus, date of diagnosis: 2009       Past history: At diagnosis he was having symptoms of fatigue.  He was started on glipizide initially and subsequently metformin was added.  Further details are not available However he has been somewhat irregular with his follow-up and did not have an A1c with his PCP between 8/13 and 11/15 He was referred here because of rising A1c of 7.5  Recent history:   He had been started on Trulicity in [id number] because of gradually increasing A1c and periodic blood sugars over 200 at home Actos was continued along with his glipizide ER 10 mg and also metformin  His A1c is 7.4 in December, previously had low as 6.7  Current blood sugar patterns, problems identified and management:  His blood sugars are overall better with improved A1c even though he says that  Because of cost he did not take Trulicity for a couple of months and just went back on it a few days ago  Recently has had only sporadic high readings up to about 150 8 in the morning and only occasionally higher at night  His diet has been variable especially over the holidays but is trying to improve now  Does try to exercise at times, using some weights and exercise bike, he thinks he had difficulty doing an indoor walking program that his wife has       Oral hypoglycemic drugs the patient is taking are: glipizide ER 20 mg, metformin 1 g twice a day      Side effects from medications have been: None Compliance with the medical regimen: Fair Hypoglycemia: Rarely    Glucose monitoring:  done one time a day         Glucometer:  Accu-Chek and Generic  Blood Glucose readings   Mean values apply above for all meters except median for One Touch  PRE-MEAL  Fasting Lunch Afternoon  Bedtime Overall  Glucose range: 91-158    107-284    Mean/median: 126   142  140  132     Self-care:  Meals: 3 meals per day. Breakfast is oatmeal/cereal/eggs and toast.  Has sandwich or soup at lunch, avoiding rice and carbs usually      Exercise: off and on, weights at times  Dietician visit, most recent: at diagnosis               Weight history:   Wt Readings from Last 3 Encounters:  03/19/16 175 lb (79.4 kg)  02/13/16 168 lb (76.2 kg)  01/27/16 172 lb 12.8 oz (78.4 kg)    Glycemic control:   Lab Results  Component Value Date   HGBA1C 7.4 (H) 02/13/2016   HGBA1C 6.7 (H) 11/14/2015   HGBA1C 7.2 (H) 05/25/2015   Lab Results  Component Value Date   MICROALBUR <0.7 02/13/2016   [place] 92 02/13/2016   CREATININE 1.14 02/13/2016   No visits with results within 1 Week(s) from this visit.  Latest known visit with results is:  Appointment on 02/13/2016  Component Date Value Ref Range Status  . Sodium 02/13/2016 140  135 - 145 mEq/L Final  . Potassium 02/13/2016 4.6  3.5 -  5.1 mEq/L Final  . Chloride 02/13/2016 100  96 - 112 mEq/L Final  . CO2 02/13/2016 33* 19 - 32 mEq/L Final  . Glucose, Bld 02/13/2016 151* 70 - 99 mg/dL Final  . BUN 02/13/2016 15  6 - 23 mg/dL Final  . Creatinine, Ser 02/13/2016 1.14  0.40 - 1.50 mg/dL Final  . Calcium 02/13/2016 9.9  8.4 - 10.5 mg/dL Final  . GFR 02/13/2016 68.74  >60.00 mL/min Final  . WBC 02/13/2016 8.8  4.0 - 10.5 K/uL Final  . RBC 02/13/2016 4.82  4.22 - 5.81 Mil/uL Final  . Hemoglobin 02/13/2016 14.5  13.0 - 17.0 g/dL Final  . HCT 02/13/2016 43.1  39.0 - 52.0 % Final  . MCV 02/13/2016 89.3  78.0 - 100.0 fl Final  . MCHC 02/13/2016 33.7  30.0 - 36.0 g/dL Final  . RDW 02/13/2016 13.3  11.5 - 15.5 % Final  . Platelets 02/13/2016 231.0  150.0 - 400.0 K/uL Final  . Neutrophils Relative % 02/13/2016 66.1  43.0 - 77.0 % Final  . Lymphocytes Relative 02/13/2016 22.3  12.0 - 46.0 % Final  . Monocytes  Relative 02/13/2016 8.3  3.0 - 12.0 % Final  . Eosinophils Relative 02/13/2016 2.7  0.0 - 5.0 % Final  . Basophils Relative 02/13/2016 0.6  0.0 - 3.0 % Final  . Neutro Abs 02/13/2016 5.8  1.4 - 7.7 K/uL Final  . Lymphs Abs 02/13/2016 2.0  0.7 - 4.0 K/uL Final  . Monocytes Absolute 02/13/2016 0.7  0.1 - 1.0 K/uL Final  . Eosinophils Absolute 02/13/2016 0.2  0.0 - 0.7 K/uL Final  . Basophils Absolute 02/13/2016 0.1  0.0 - 0.1 K/uL Final  . Hgb A1c MFr Bld 02/13/2016 7.4* 4.6 - 6.5 % Final  . Total Bilirubin 02/13/2016 0.7  0.2 - 1.2 mg/dL Final  . Bilirubin, Direct 02/13/2016 0.1  0.0 - 0.3 mg/dL Final  . Alkaline Phosphatase 02/13/2016 35* 39 - 117 U/L Final  . AST 02/13/2016 10  0 - 37 U/L Final  . ALT 02/13/2016 12  0 - 53 U/L Final  . Total Protein 02/13/2016 6.8  6.0 - 8.3 g/dL Final  . Albumin 02/13/2016 4.6  3.5 - 5.2 g/dL Final  . Cholesterol 02/13/2016 173  0 - 200 mg/dL Final  . Triglycerides 02/13/2016 127.0  0.0 - 149.0 mg/dL Final  . HDL 02/13/2016 54.90  >39.00 mg/dL Final  . VLDL 02/13/2016 25.4  0.0 - 40.0 mg/dL Final  . LDL Cholesterol 02/13/2016 92  0 - 99 mg/dL Final  . Total CHOL/HDL Ratio 02/13/2016 3   Final  . NonHDL 02/13/2016 117.69   Final  . PSA 02/13/2016 4.81* 0.10 - 4.00 ng/mL Final  . TSH 02/13/2016 2.13  0.35 - 4.50 uIU/mL Final  . Color, Urine 02/13/2016 YELLOW  Yellow;Lt. Yellow Final  . APPearance 02/13/2016 CLEAR  Clear Final  . Specific Gravity, Urine 02/13/2016 1.015  1.000 - 1.030 Final  . pH 02/13/2016 6.5  5.0 - 8.0 Final  . Total Protein, Urine 02/13/2016 NEGATIVE  Negative Final  . Urine Glucose 02/13/2016 NEGATIVE  Negative Final  . Ketones, ur 02/13/2016 NEGATIVE  Negative Final  . Bilirubin Urine 02/13/2016 NEGATIVE  Negative Final  . Hgb urine dipstick 02/13/2016 NEGATIVE  Negative Final  . Urobilinogen, UA 02/13/2016 0.2  0.0 - 1.0 Final  . Leukocytes, UA 02/13/2016 NEGATIVE  Negative Final  . Nitrite 02/13/2016 NEGATIVE  Negative  Final  . Microalb, Ur 02/13/2016 <0.7  0.0 - 1.9  mg/dL Final  . Creatinine,U 02/13/2016 129.4  mg/dL Final  . Microalb Creat Ratio 02/13/2016 0.5  0.0 - 30.0 mg/g Final     Allergies as of 03/19/2016   No Known Allergies     Medication List       Accurate as of 03/19/16  8:23 AM. Always use your most recent med list.          aspirin 81 MG tablet Take 81 mg by mouth daily.   atorvastatin 20 MG tablet Commonly known as:  LIPITOR TAKE 1 TABLET EVERY DAY   cyclobenzaprine 10 MG tablet Commonly known as:  FLEXERIL Take 1 tablet (10 mg total) by mouth 3 (three) times daily as needed for muscle spasms.   glipiZIDE 10 MG 24 hr tablet Commonly known as:  GLUCOTROL XL Take 1 tablet (10 mg total) by mouth daily.   levothyroxine 100 MCG tablet Commonly known as:  SYNTHROID, LEVOTHROID Take 1 tablet (100 mcg total) by mouth daily.   losartan 100 MG tablet Commonly known as:  COZAAR TAKE 1 TABLET BY MOUTH ONCE DAILY   metFORMIN 1000 MG tablet Commonly known as:  GLUCOPHAGE Take 1 tablet (1,000 mg total) by mouth 2 (two) times daily.   pioglitazone 15 MG tablet Commonly known as:  ACTOS Take 1 tablet (15 mg total) by mouth daily.   sitaGLIPtin 100 MG tablet Commonly known as:  JANUVIA TAKE 1 TABLET (100 MG TOTAL) BY MOUTH DAILY.   terazosin 1 MG capsule Commonly known as:  HYTRIN Take 1 capsule (1 mg total) by mouth at bedtime.   TRULICITY [id number] 0000000 Sopn Generic drug:  Dulaglutide INJECT IN THE ABDOMINAL SKIN AS DIRECTED ONCE A WEEK   valACYclovir 500 MG tablet Commonly known as:  VALTREX Take 1 tablet (500 mg total) by mouth daily.       Allergies: No Known Allergies  Past Medical History:  Diagnosis Date  . ARTHRITIS, HIP 08/10/2008  . BURSITIS, RIGHT SHOULDER 05/20/2008  . Degeneration of lumbar or lumbosacral intervertebral disc 11/27/2006  . DEGENERATION, CERVICAL DISC 11/27/2006  . Degenerative disc disease, lumbar   . Depression   . DEPRESSION  11/25/2006  . Diabetes mellitus   . DIABETES MELLITUS, TYPE II 04/10/2007  . Epicondylitis    bilateral  . Fatigue   . HERPES GENITALIS 09/17/2007  . Hyperlipemia   . HYPERLIPIDEMIA 03/31/2007  . Hypertension   . HYPERTENSION 11/25/2006  . HYPOTHYROIDISM 10/28/2007  . Impaired fasting glucose 12/26/2006  . Lateral epicondylitis  of elbow 05/05/2007  . Sciatica 10/28/2007  . SHOULDER PAIN, RIGHT 08/18/2008    Past Surgical History:  Procedure Laterality Date  . ACHILLES TENDON REPAIR     left  . ELBOW SURGERY  2005   left  . herniated lumbar disc  1997, 2011  . KNEE ARTHROSCOPY  2007   left  . ROTATOR CUFF REPAIR  1997   right    Family History  Problem Relation Age of Onset  . Diabetes Mother   . Heart disease Mother   . Hypertension Mother   . Heart disease Father   . Diabetes Sister   . Colon cancer Neg Hx     Social History:  reports that he has never smoked. He has never used smokeless tobacco. He reports that he drinks about 1.8 oz of alcohol per week . He reports that he does not use drugs.    Review of Systems   Home BP 120-140/75-85  Most recent eye exam was in  12/16       Lipids: He has been treated with Lipitor 20 mg   but LDL is not at target.  Previously had been inconsistently compliant with this He has been treated by his PCP      Lab Results  Component Value Date   CHOL 173 02/13/2016   HDL 54.90 02/13/2016   LDLCALC 92 02/13/2016   LDLDIRECT 126.0 05/12/2014   TRIG 127.0 02/13/2016   CHOLHDL 3 02/13/2016                   Thyroid: He has been on Synthroid for the last few years, initially had symptoms of fatigue at diagnosis  Lab Results  Component Value Date   TSH 2.13 02/13/2016   TSH 1.82 02/09/2015   TSH 2.20 12/31/2013   Diabetic foot exam normal in 1/18    LABS:  No visits with results within 1 Week(s) from this visit.  Latest known visit with results is:  Appointment on 02/13/2016  Component Date Value Ref Range Status   . Sodium 02/13/2016 140  135 - 145 mEq/L Final  . Potassium 02/13/2016 4.6  3.5 - 5.1 mEq/L Final  . Chloride 02/13/2016 100  96 - 112 mEq/L Final  . CO2 02/13/2016 33* 19 - 32 mEq/L Final  . Glucose, Bld 02/13/2016 151* 70 - 99 mg/dL Final  . BUN 02/13/2016 15  6 - 23 mg/dL Final  . Creatinine, Ser 02/13/2016 1.14  0.40 - 1.50 mg/dL Final  . Calcium 02/13/2016 9.9  8.4 - 10.5 mg/dL Final  . GFR 02/13/2016 68.74  >60.00 mL/min Final  . WBC 02/13/2016 8.8  4.0 - 10.5 K/uL Final  . RBC 02/13/2016 4.82  4.22 - 5.81 Mil/uL Final  . Hemoglobin 02/13/2016 14.5  13.0 - 17.0 g/dL Final  . HCT 02/13/2016 43.1  39.0 - 52.0 % Final  . MCV 02/13/2016 89.3  78.0 - 100.0 fl Final  . MCHC 02/13/2016 33.7  30.0 - 36.0 g/dL Final  . RDW 02/13/2016 13.3  11.5 - 15.5 % Final  . Platelets 02/13/2016 231.0  150.0 - 400.0 K/uL Final  . Neutrophils Relative % 02/13/2016 66.1  43.0 - 77.0 % Final  . Lymphocytes Relative 02/13/2016 22.3  12.0 - 46.0 % Final  . Monocytes Relative 02/13/2016 8.3  3.0 - 12.0 % Final  . Eosinophils Relative 02/13/2016 2.7  0.0 - 5.0 % Final  . Basophils Relative 02/13/2016 0.6  0.0 - 3.0 % Final  . Neutro Abs 02/13/2016 5.8  1.4 - 7.7 K/uL Final  . Lymphs Abs 02/13/2016 2.0  0.7 - 4.0 K/uL Final  . Monocytes Absolute 02/13/2016 0.7  0.1 - 1.0 K/uL Final  . Eosinophils Absolute 02/13/2016 0.2  0.0 - 0.7 K/uL Final  . Basophils Absolute 02/13/2016 0.1  0.0 - 0.1 K/uL Final  . Hgb A1c MFr Bld 02/13/2016 7.4* 4.6 - 6.5 % Final  . Total Bilirubin 02/13/2016 0.7  0.2 - 1.2 mg/dL Final  . Bilirubin, Direct 02/13/2016 0.1  0.0 - 0.3 mg/dL Final  . Alkaline Phosphatase 02/13/2016 35* 39 - 117 U/L Final  . AST 02/13/2016 10  0 - 37 U/L Final  . ALT 02/13/2016 12  0 - 53 U/L Final  . Total Protein 02/13/2016 6.8  6.0 - 8.3 g/dL Final  . Albumin 02/13/2016 4.6  3.5 - 5.2 g/dL Final  . Cholesterol 02/13/2016 173  0 - 200 mg/dL Final  . Triglycerides  02/13/2016 127.0  0.0 - 149.0 mg/dL  Final  . HDL 02/13/2016 54.90  >39.00 mg/dL Final  . VLDL 02/13/2016 25.4  0.0 - 40.0 mg/dL Final  . LDL Cholesterol 02/13/2016 92  0 - 99 mg/dL Final  . Total CHOL/HDL Ratio 02/13/2016 3   Final  . NonHDL 02/13/2016 117.69   Final  . PSA 02/13/2016 4.81* 0.10 - 4.00 ng/mL Final  . TSH 02/13/2016 2.13  0.35 - 4.50 uIU/mL Final  . Color, Urine 02/13/2016 YELLOW  Yellow;Lt. Yellow Final  . APPearance 02/13/2016 CLEAR  Clear Final  . Specific Gravity, Urine 02/13/2016 1.015  1.000 - 1.030 Final  . pH 02/13/2016 6.5  5.0 - 8.0 Final  . Total Protein, Urine 02/13/2016 NEGATIVE  Negative Final  . Urine Glucose 02/13/2016 NEGATIVE  Negative Final  . Ketones, ur 02/13/2016 NEGATIVE  Negative Final  . Bilirubin Urine 02/13/2016 NEGATIVE  Negative Final  . Hgb urine dipstick 02/13/2016 NEGATIVE  Negative Final  . Urobilinogen, UA 02/13/2016 0.2  0.0 - 1.0 Final  . Leukocytes, UA 02/13/2016 NEGATIVE  Negative Final  . Nitrite 02/13/2016 NEGATIVE  Negative Final  . Microalb, Ur 02/13/2016 <0.7  0.0 - 1.9 mg/dL Final  . Creatinine,U 02/13/2016 129.4  mg/dL Final  . Microalb Creat Ratio 02/13/2016 0.5  0.0 - 30.0 mg/g Final    Physical Examination:  BP 120/74 (BP Location: Left Arm, Patient Position: Sitting)   Pulse 78   Wt 175 lb (79.4 kg)   SpO2 95%   BMI 29.12 kg/m              Diabetic Foot Exam - Simple   Simple Foot Form Diabetic Foot exam was performed with the following findings:  Yes 03/19/2016  8:21 AM  Visual Inspection No deformities, no ulcerations, no other skin breakdown bilaterally:  Yes Sensation Testing Intact to touch and monofilament testing bilaterally:  Yes Pulse Check Posterior Tibialis and Dorsalis pulse intact bilaterally:  Yes Comments     ASSESSMENT:  Diabetes type 2, nonobese.  See history of present illness for detailed discussion of his current management, blood sugar patterns and problems identified  Again he has been irregular with his Trulicity  and now because of cost. This affected his A1c in has gone up to 7.4 Also recently has gained back weight partly from being off to Trulicity and also lack of diet and exercise  Blood sugars are reasonably good at home with only sporadic high readings at various times  Complications: Has only minimal symptoms of neuropathy and normal foot exam today To have eye exam this week   PLAN:   Continue same regimen but can stop Januvia when taking Trulicity   Also he can check other GLP-1 drugs and the same family of they are better covered  More readings after meals.  He can start more aerobic exercise such as indoor walking  Samples of One Touch test strips given  Continue all other medications unchanged   Patient Instructions  Check blood sugars on waking up  2x weekly  Also check blood sugars about 2 hours after a meal and do this after different meals by rotation  Recommended blood sugar levels on waking up is 90-130 and about 2 hours after meal is 130-160  Please bring your blood sugar monitor to each visit, thank you  May leave off [place] 03/19/2016, 8:23 AM   Note: This office note was prepared with Dragon voice recognition system technology. Any transcriptional  errors that result from this process are unintentional.

## 2016-03-19 ENCOUNTER — Encounter: Payer: Self-pay | Admitting: Endocrinology

## 2016-03-19 ENCOUNTER — Ambulatory Visit (INDEPENDENT_AMBULATORY_CARE_PROVIDER_SITE_OTHER): Payer: 59 | Admitting: Endocrinology

## 2016-03-19 VITALS — BP 120/74 | HR 78 | Wt 175.0 lb

## 2016-03-19 DIAGNOSIS — E1165 Type 2 diabetes mellitus with hyperglycemia: Secondary | ICD-10-CM

## 2016-03-19 NOTE — Patient Instructions (Signed)
Check blood sugars on waking up  2x weekly  Also check blood sugars about 2 hours after a meal and do this after different meals by rotation  Recommended blood sugar levels on waking up is 90-130 and about 2 hours after meal is 130-160  Please bring your blood sugar monitor to each visit, thank you  May leave off Januvia

## 2016-03-22 LAB — HM DIABETES EYE EXAM

## 2016-03-23 ENCOUNTER — Encounter: Payer: Self-pay | Admitting: Internal Medicine

## 2016-03-28 ENCOUNTER — Other Ambulatory Visit: Payer: Self-pay | Admitting: Internal Medicine

## 2016-04-09 ENCOUNTER — Telehealth: Payer: Self-pay | Admitting: Endocrinology

## 2016-04-09 MED ORDER — DULAGLUTIDE 0.75 MG/0.5ML ~~LOC~~ SOAJ: SUBCUTANEOUS | 1 refills | Status: DC

## 2016-04-09 NOTE — Telephone Encounter (Signed)
Refill submitted. 

## 2016-04-09 NOTE — Telephone Encounter (Signed)
Pt called in and said that he needs a 90 days supply of his Trulicity called into the [facility] at [employer].

## 2016-04-21 ENCOUNTER — Ambulatory Visit (INDEPENDENT_AMBULATORY_CARE_PROVIDER_SITE_OTHER): Payer: 59 | Admitting: Internal Medicine

## 2016-04-21 ENCOUNTER — Encounter: Payer: Self-pay | Admitting: Internal Medicine

## 2016-04-21 VITALS — BP 142/78 | HR 102 | Temp 100.1°F | Wt 168.0 lb

## 2016-04-21 DIAGNOSIS — J101 Influenza due to other identified influenza virus with other respiratory manifestations: Secondary | ICD-10-CM | POA: Diagnosis not present

## 2016-04-21 DIAGNOSIS — R6889 Other general symptoms and signs: Secondary | ICD-10-CM

## 2016-04-21 DIAGNOSIS — R0902 Hypoxemia: Secondary | ICD-10-CM

## 2016-04-21 LAB — POCT INFLUENZA A/B
INFLUENZA A, POC: POSITIVE — AB
INFLUENZA B, POC: NEGATIVE

## 2016-04-21 MED ORDER — OSELTAMIVIR PHOSPHATE 75 MG PO CAPS: 75.0000 mg | ORAL_CAPSULE | Freq: Two times a day (BID) | ORAL | 0 refills | Status: DC

## 2016-04-21 NOTE — Patient Instructions (Signed)
You have influenza A    Please start the TamiflU ASAP twice daily until gone  Tessalon perles for cough  If you become more short of breath or weaker than you are now,  GO TO THE ER   Influenza, Adult Influenza, more commonly known as "the flu," is a viral infection that primarily affects the respiratory tract. The respiratory tract includes organs that help you breathe, such as the lungs, nose, and throat. The flu causes many common cold symptoms, as well as a high fever and body aches. The flu spreads easily from person to person (is contagious). Getting a flu shot (influenza vaccination) every year is the best way to prevent influenza. What are the causes? Influenza is caused by a virus. You can catch the virus by:  Breathing in droplets from an infected person's cough or sneeze.  Touching something that was recently contaminated with the virus and then touching your mouth, nose, or eyes. What increases the risk? The following factors may make you more likely to get the flu:  Not cleaning your hands frequently with soap and water or alcohol-based hand sanitizer.  Having close contact with many people during cold and flu season.  Touching your mouth, eyes, or nose without washing or sanitizing your hands first.  Not drinking enough fluids or not eating a healthy diet.  Not getting enough sleep or exercise.  Being under a high amount of stress.  Not getting a yearly (annual) flu shot. You may be at a higher risk of complications from the flu, such as a severe lung infection (pneumonia), if you:  Are over the age of 34.  Are pregnant.  Have a weakened disease-fighting system (immune system). You may have a weakened immune system if you:  Have HIV or AIDS.  Are undergoing chemotherapy.  Aretaking medicines that reduce the activity of (suppress) the immune system.  Have a long-term (chronic) illness, such as heart disease, kidney disease, diabetes, or lung  disease.  Have a liver disorder.  Are obese.  Have anemia. What are the signs or symptoms? Symptoms of this condition typically last 4-10 days and may include:  Fever.  Chills.  Headache, body aches, or muscle aches.  Sore throat.  Cough.  Runny or congested nose.  Chest discomfort and cough.  Poor appetite.  Weakness or tiredness (fatigue).  Dizziness.  Nausea or vomiting. How is this diagnosed? This condition may be diagnosed based on your medical history and a physical exam. Your health care provider may do a nose or throat swab test to confirm the diagnosis. How is this treated? If influenza is detected early, you can be treated with antiviral medicine that can reduce the length of your illness and the severity of your symptoms. This medicine may be given by mouth (orally) or through an IV tube that is inserted in one of your veins. The goal of treatment is to relieve symptoms by taking care of yourself at home. This may include taking over-the-counter medicines, drinking plenty of fluids, and adding humidity to the air in your home. In some cases, influenza goes away on its own. Severe influenza or complications from influenza may be treated in a hospital. Follow these instructions at home:  Take over-the-counter and prescription medicines only as told by your health care provider.  Use a cool mist humidifier to add humidity to the air in your home. This can make breathing easier.  Rest as needed.  Drink enough fluid to keep your urine clear or  pale yellow.  Cover your mouth and nose when you cough or sneeze.  Wash your hands with soap and water often, especially after you cough or sneeze. If soap and water are not available, use hand sanitizer.  Stay home from work or school as told by your health care provider. Unless you are visiting your health care provider, try to avoid leaving home until your fever has been gone for 24 hours without the use of  medicine.  Keep all follow-up visits as told by your health care provider. This is important. How is this prevented?  Getting an annual flu shot is the best way to avoid getting the flu. You may get the flu shot in late summer, fall, or winter. Ask your health care provider when you should get your flu shot.  Wash your hands often or use hand sanitizer often.  Avoid contact with people who are sick during cold and flu season.  Eat a healthy diet, drink plenty of fluids, get enough sleep, and exercise regularly. Contact a health care provider if:  You develop new symptoms.  You have:  Chest pain.  Diarrhea.  A fever.  Your cough gets worse.  You produce more mucus.  You feel nauseous or you vomit. Get help right away if:  You develop shortness of breath or difficulty breathing.  Your skin or nails turn a bluish color.  You have severe pain or stiffness in your neck.  You develop a sudden headache or sudden pain in your face or ear.  You cannot stop vomiting. This information is not intended to replace advice given to you by your health care provider. Make sure you discuss any questions you have with your health care provider. Document Released: 02/10/2000 Document Revised: 07/21/2015 Document Reviewed: 12/07/2014 Elsevier Interactive Patient Education  2017 [employer].

## 2016-04-21 NOTE — Assessment & Plan Note (Signed)
Diagnosed with POCT.  Symptom onset < 48 hours.  tamiflu and supportive care.  He is NOT orthostatic by my check.  Given his mild dyspnea, he is warned to go to the ER if his dypsnea worsens

## 2016-04-21 NOTE — Progress Notes (Signed)
Pre visit review using our clinic review tool, if applicable. No additional management support is needed unless otherwise documented below in the visit note. 

## 2016-04-21 NOTE — Progress Notes (Signed)
Subjective:  Patient ID: Ricardo [name], male    DOB: 26-May-1950  Age: 66 y.o. MRN: [id number]  CC: The primary encounter diagnosis was Hypoxia. Diagnoses of Flu-like symptoms and Influenza A were also pertinent to this visit.  HPI [name] presents for URi symptoms  Thursday:  Sudden onset of headache.  Came on acutely,  Felt Burning in lungs, nonproductive cough, , short of breath with minimal exertion.  Has spetn the last 2 days on the cough lying around.  Febrile to 101.2   Anorexia  Drinking but not eating any solid food for 24 hours.  no recent travel,  grandkids were sick a week ago but they were recovering .  Wife being treated for sinusits , he thinks.  .    Anorexic .  As not eaten in 24 horu  Outpatient Medications Prior to Visit  Medication Sig Dispense Refill  . aspirin 81 MG tablet Take 81 mg by mouth daily.      [name] atorvastatin (LIPITOR) 20 MG tablet TAKE 1 TABLET EVERY DAY 90 tablet 3  . cyclobenzaprine (FLEXERIL) 10 MG tablet Take 1 tablet (10 mg total) by mouth 3 (three) times daily as needed for muscle spasms. 30 tablet 0  . Dulaglutide (TRULICITY) [id number] 0000000 SOPN INJECT IN THE ABDOMINAL SKIN AS DIRECTED ONCE A WEEK 12 pen 1  . glipiZIDE (GLUCOTROL XL) 10 MG 24 hr tablet Take 1 tablet (10 mg total) by mouth daily. 60 tablet 2  . levothyroxine (SYNTHROID, LEVOTHROID) 100 MCG tablet TAKE 1 TABLET (100 MCG TOTAL) BY MOUTH DAILY. 90 tablet 3  . losartan (COZAAR) 100 MG tablet TAKE 1 TABLET BY MOUTH ONCE DAILY 90 tablet 1  . metFORMIN (GLUCOPHAGE) 1000 MG tablet Take 1 tablet (1,000 mg total) by mouth 2 (two) times daily. 180 tablet 1  . pioglitazone (ACTOS) 15 MG tablet Take 1 tablet (15 mg total) by mouth daily. 30 tablet 2  . sitaGLIPtin (JANUVIA) 100 MG tablet TAKE 1 TABLET (100 MG TOTAL) BY MOUTH DAILY. 90 tablet 1  . terazosin (HYTRIN) 1 MG capsule Take 1 capsule (1 mg total) by mouth at bedtime. 90 capsule 3  . valACYclovir (VALTREX) 500 MG tablet Take 1  tablet (500 mg total) by mouth daily. 90 tablet 3   No facility-administered medications prior to visit.     Review of Systems;  Patient denies , unintentional weight loss, skin rash, eye pain, sinus congestion  sore throat, dysphagia,  hemoptysis , , wheezing, chest pain, palpitations, orthopnea, edema, abdominal pain, nausea, melena, diarrhea, constipation, flank pain, dysuria, hematuria, urinary  Frequency, nocturia, numbness, tingling, seizures,  Focal weakness, Loss of consciousness,  Tremor, insomnia, depression, anxiety, and suicidal ideation.      Objective:  BP (!) 142/78   Pulse (!) 102   Temp 100.1 F (37.8 C) (Oral)   Wt 168 lb (76.2 kg)   SpO2 95%   BMI 27.96 kg/m   BP Readings from Last 3 Encounters:  04/21/16 (!) 142/78  03/19/16 120/74  02/13/16 140/90    Wt Readings from Last 3 Encounters:  04/21/16 168 lb (76.2 kg)  03/19/16 175 lb (79.4 kg)  02/13/16 168 lb (76.2 kg)    General appearance: alert, cooperative and appears stated age.  Appears acutely ill.  Ears: normal TM's and external ear canals both ears Throat: lips, mucosa, and tongue normal; teeth and gums normal Neck: no adenopathy, no carotid bruit, supple, symmetrical, trachea midline and thyroid not enlarged, symmetric, no tenderness/mass/nodules Face:  tender over both frontals both maxillary sinuses Back: symmetric, no curvature. ROM normal. No CVA tenderness. Lungs: clear to auscultation bilaterally, no egophony.  Heart: regular rate and rhythm, S1, S2 normal, no murmur, click, rub or gallop Abdomen: soft, non-tender; bowel sounds normal; no masses,  no organomegaly Pulses: 2+ and symmetric Skin: warm, dry . No rashes or lesions Lymph nodes: Cervical, supraclavicular, and axillary nodes normal.  Lab Results  Component Value Date   HGBA1C 7.4 (H) 02/13/2016   HGBA1C 6.7 (H) 11/14/2015   HGBA1C 7.2 (H) 05/25/2015    Lab Results  Component Value Date   CREATININE 1.14 02/13/2016    CREATININE 1.32 05/25/2015   CREATININE 1.26 02/09/2015    Lab Results  Component Value Date   WBC 8.8 02/13/2016   HGB 14.5 02/13/2016   HCT 43.1 02/13/2016   PLT 231.0 02/13/2016   GLUCOSE 151 (H) 02/13/2016   CHOL 173 02/13/2016   TRIG 127.0 02/13/2016   HDL 54.90 02/13/2016   LDLDIRECT 126.0 05/12/2014   LDLCALC 92 02/13/2016   ALT 12 02/13/2016   AST 10 02/13/2016   NA 140 02/13/2016   K 4.6 02/13/2016   CL 100 02/13/2016   CREATININE 1.14 02/13/2016   BUN 15 02/13/2016   CO2 33 (H) 02/13/2016   TSH 2.13 02/13/2016   PSA 4.81 (H) 02/13/2016   INR 1.0 04/02/2007   HGBA1C 7.4 (H) 02/13/2016   MICROALBUR <0.7 02/13/2016    Dg Cervical Spine Complete  Result Date: 11/28/2014 CLINICAL DATA:  Neck pain and stiffness which began this morning EXAM: CERVICAL SPINE  4+ VIEWS COMPARISON:  None. FINDINGS: Straightening of the cervical lordosis. No fracture. No prevertebral soft tissue swelling. There is mild degenerative disc disease at C3-4, C4-5, C5-6, and C6-7. There is C4-5 and C5-6 moderate foraminal narrowing, with mild foraminal narrowing above and below this level, bilaterally. Mild left carotid calcification. IMPRESSION: Degenerative changes with no acute findings Electronically Signed   By: Skipper Cliche M.D.   On: 11/28/2014 17:49    Assessment & Plan:   Problem List Items Addressed This Visit    Influenza A    Diagnosed with POCT.  Symptom onset < 48 hours.  tamiflu and supportive care.  He is NOT orthostatic by my check.  Given his mild dyspnea, he is warned to go to the ER if his dypsnea worsens       Relevant Medications   oseltamivir (TAMIFLU) 75 MG capsule    Other Visit Diagnoses    Hypoxia    -  Primary   Relevant Orders   POCT Influenza A/B (Completed)   Flu-like symptoms       Relevant Orders   POCT Influenza A/B (Completed)      I am having Mr. Anez start on oseltamivir. I am also having him maintain his aspirin, losartan, atorvastatin,  metFORMIN, glipiZIDE, pioglitazone, sitaGLIPtin, cyclobenzaprine, terazosin, valACYclovir, levothyroxine, and Dulaglutide.  Meds ordered this encounter  Medications  . oseltamivir (TAMIFLU) 75 MG capsule    Sig: Take 1 capsule (75 mg total) by mouth 2 (two) times daily.    Dispense:  10 capsule    Refill:  0    There are no discontinued medications.  Follow-up: No Follow-up on file.   Crecencio Mc, MD

## 2016-05-15 ENCOUNTER — Ambulatory Visit: Payer: 59 | Admitting: Internal Medicine

## 2016-05-16 ENCOUNTER — Ambulatory Visit (INDEPENDENT_AMBULATORY_CARE_PROVIDER_SITE_OTHER): Payer: 59 | Admitting: Internal Medicine

## 2016-05-16 ENCOUNTER — Encounter: Payer: Self-pay | Admitting: Internal Medicine

## 2016-05-16 DIAGNOSIS — E034 Atrophy of thyroid (acquired): Secondary | ICD-10-CM | POA: Diagnosis not present

## 2016-05-16 DIAGNOSIS — I10 Essential (primary) hypertension: Secondary | ICD-10-CM | POA: Diagnosis not present

## 2016-05-16 DIAGNOSIS — M25562 Pain in left knee: Secondary | ICD-10-CM

## 2016-05-16 DIAGNOSIS — E119 Type 2 diabetes mellitus without complications: Secondary | ICD-10-CM | POA: Diagnosis not present

## 2016-05-16 MED ORDER — VALACYCLOVIR HCL 500 MG PO TABS: 500.0000 mg | ORAL_TABLET | Freq: Every day | ORAL | 3 refills | Status: DC

## 2016-05-16 MED ORDER — ATORVASTATIN CALCIUM 20 MG PO TABS: 20.0000 mg | ORAL_TABLET | Freq: Every day | ORAL | 3 refills | Status: DC

## 2016-05-16 NOTE — Assessment & Plan Note (Signed)
On Levothroid 

## 2016-05-16 NOTE — Assessment & Plan Note (Signed)
Cont w/Janvia, Metformin, Glipizide, Losartan, Lipitor, ASA

## 2016-05-16 NOTE — Progress Notes (Signed)
Pre-visit discussion using our clinic review tool. No additional management support is needed unless otherwise documented below in the visit note.  

## 2016-05-16 NOTE — Assessment & Plan Note (Signed)
Losartan, ASA

## 2016-05-16 NOTE — Assessment & Plan Note (Signed)
?   Lat lipoma, posterior Sport Med ref

## 2016-05-16 NOTE — Progress Notes (Signed)
Subjective:  Patient ID: Ricardo [name], male    DOB: 24-Feb-1951  Age: 66 y.o. MRN: [id number]  CC: Diabetes; Hypertension; Hypothyroidism; Cough; and Leg Problem (left knee swelling or inflammation )   HPI Nehal Shives presents for HTN, DM2, LBP f/u  Outpatient Medications Prior to Visit  Medication Sig Dispense Refill  . aspirin 81 MG tablet Take 81 mg by mouth daily.      [name] atorvastatin (LIPITOR) 20 MG tablet TAKE 1 TABLET EVERY DAY 90 tablet 3  . Dulaglutide (TRULICITY) 8.82 CM/0.3KJ SOPN INJECT IN THE ABDOMINAL SKIN AS DIRECTED ONCE A WEEK 12 pen 1  . glipiZIDE (GLUCOTROL XL) 10 MG 24 hr tablet Take 1 tablet (10 mg total) by mouth daily. 60 tablet 2  . levothyroxine (SYNTHROID, LEVOTHROID) 100 MCG tablet TAKE 1 TABLET (100 MCG TOTAL) BY MOUTH DAILY. 90 tablet 3  . losartan (COZAAR) 100 MG tablet TAKE 1 TABLET BY MOUTH ONCE DAILY 90 tablet 1  . metFORMIN (GLUCOPHAGE) 1000 MG tablet Take 1 tablet (1,000 mg total) by mouth 2 (two) times daily. 180 tablet 1  . pioglitazone (ACTOS) 15 MG tablet Take 1 tablet (15 mg total) by mouth daily. 30 tablet 2  . terazosin (HYTRIN) 1 MG capsule Take 1 capsule (1 mg total) by mouth at bedtime. 90 capsule 3  . valACYclovir (VALTREX) 500 MG tablet Take 1 tablet (500 mg total) by mouth daily. 90 tablet 3  . sitaGLIPtin (JANUVIA) 100 MG tablet TAKE 1 TABLET (100 MG TOTAL) BY MOUTH DAILY. (Patient not taking: Reported on 05/16/2016) 90 tablet 1  . cyclobenzaprine (FLEXERIL) 10 MG tablet Take 1 tablet (10 mg total) by mouth 3 (three) times daily as needed for muscle spasms. 30 tablet 0  . oseltamivir (TAMIFLU) 75 MG capsule Take 1 capsule (75 mg total) by mouth 2 (two) times daily. 10 capsule 0   No facility-administered medications prior to visit.     ROS Review of Systems  Constitutional: Negative for appetite change, fatigue and unexpected weight change.  HENT: Negative for congestion, nosebleeds, sneezing, sore throat and trouble swallowing.    Eyes: Negative for itching and visual disturbance.  Respiratory: Negative for cough.   Cardiovascular: Negative for chest pain, palpitations and leg swelling.  Gastrointestinal: Negative for abdominal distention, blood in stool, diarrhea and nausea.  Genitourinary: Negative for frequency and hematuria.  Musculoskeletal: Positive for back pain. Negative for gait problem, joint swelling and neck pain.  Skin: Negative for rash.  Neurological: Negative for dizziness, tremors, speech difficulty and weakness.  Psychiatric/Behavioral: Negative for agitation, dysphoric mood and sleep disturbance. The patient is not nervous/anxious.     Objective:  BP (!) 146/96   Pulse 81   Temp 98.8 F (37.1 C) (Oral)   Resp 16   Ht 5\' 8"  (1.727 m)   Wt 171 lb (77.6 kg)   SpO2 97%   BMI 26.00 kg/m   BP Readings from Last 3 Encounters:  05/16/16 (!) 146/96  04/21/16 (!) 142/78  03/19/16 120/74    Wt Readings from Last 3 Encounters:  05/16/16 171 lb (77.6 kg)  04/21/16 168 lb (76.2 kg)  03/19/16 175 lb (79.4 kg)    Physical Exam  Constitutional: He is oriented to person, place, and time. He appears well-developed. No distress.  NAD  HENT:  Mouth/Throat: Oropharynx is clear and moist.  Eyes: Conjunctivae are normal. Pupils are equal, round, and reactive to light.  Neck: Normal range of motion. No JVD present. No thyromegaly present.  Cardiovascular: Normal rate, regular rhythm, normal heart sounds and intact distal pulses.  Exam reveals no gallop and no friction rub.   No murmur heard. Pulmonary/Chest: Effort normal and breath sounds normal. No respiratory distress. He has no wheezes. He has no rales. He exhibits no tenderness.  Abdominal: Soft. Bowel sounds are normal. He exhibits no distension and no mass. There is no tenderness. There is no rebound and no guarding.  Musculoskeletal: Normal range of motion. He exhibits no edema or tenderness.  Lymphadenopathy:    He has no cervical  adenopathy.  Neurological: He is alert and oriented to person, place, and time. He has normal reflexes. No cranial nerve deficit. He exhibits normal muscle tone. He displays a negative Romberg sign. Coordination and gait normal.  Skin: Skin is warm and dry. No rash noted.  Psychiatric: He has a normal mood and affect. His behavior is normal. Judgment and thought content normal.    Lab Results  Component Value Date   WBC 8.8 02/13/2016   HGB 14.5 02/13/2016   HCT 43.1 02/13/2016   PLT 231.0 02/13/2016   GLUCOSE 151 (H) 02/13/2016   CHOL 173 02/13/2016   TRIG 127.0 02/13/2016   HDL 54.90 02/13/2016   LDLDIRECT 126.0 05/12/2014   LDLCALC 92 02/13/2016   ALT 12 02/13/2016   AST 10 02/13/2016   NA 140 02/13/2016   K 4.6 02/13/2016   CL 100 02/13/2016   CREATININE 1.14 02/13/2016   BUN 15 02/13/2016   CO2 33 (H) 02/13/2016   TSH 2.13 02/13/2016   PSA 4.81 (H) 02/13/2016   INR 1.0 04/02/2007   HGBA1C 7.4 (H) 02/13/2016   MICROALBUR <0.7 02/13/2016    Dg Cervical Spine Complete  Result Date: 11/28/2014 CLINICAL DATA:  Neck pain and stiffness which began this morning EXAM: CERVICAL SPINE  4+ VIEWS COMPARISON:  None. FINDINGS: Straightening of the cervical lordosis. No fracture. No prevertebral soft tissue swelling. There is mild degenerative disc disease at C3-4, C4-5, C5-6, and C6-7. There is C4-5 and C5-6 moderate foraminal narrowing, with mild foraminal narrowing above and below this level, bilaterally. Mild left carotid calcification. IMPRESSION: Degenerative changes with no acute findings Electronically Signed   By: Skipper Cliche M.D.   On: 11/28/2014 17:49    Assessment & Plan:   There are no diagnoses linked to this encounter. I have discontinued Mr. Depriest's cyclobenzaprine and oseltamivir. I am also having him maintain his aspirin, losartan, atorvastatin, metFORMIN, glipiZIDE, pioglitazone, sitaGLIPtin, terazosin, valACYclovir, levothyroxine, and Dulaglutide.  No orders  of the defined types were placed in this encounter.    Follow-up: No Follow-up on file.  Walker Kehr, MD

## 2016-05-29 NOTE — Progress Notes (Signed)
[facility] [address] [place], [place] [address] Phone: [phone] Subjective:    I'm seeing this patient by the request  of:  Walker Kehr, MD   CC: Left knee pain  [id number]  Ricardo [name] is a 66 y.o. male coming in with complaint of left knee pain. Now both knees but still left more then right. She does not remember any very specific injury. Since then it seemed to start with the course last several weeks. Patient denies any significant swelling. Has had known arthritis of the hips bilaterally and states that this feels somewhat similar. Patient's past medical history significant for meniscectomy on the left knee back and 2013. Patient denies any locking but states that sometimes going up and down stairs can be aggravating.      Past Medical History:  Diagnosis Date  . ARTHRITIS, HIP 08/10/2008  . BURSITIS, RIGHT SHOULDER 05/20/2008  . Degeneration of lumbar or lumbosacral intervertebral disc 11/27/2006  . DEGENERATION, CERVICAL DISC 11/27/2006  . Degenerative disc disease, lumbar   . Depression   . DEPRESSION 11/25/2006  . Diabetes mellitus   . DIABETES MELLITUS, TYPE II 04/10/2007  . Epicondylitis    bilateral  . Fatigue   . HERPES GENITALIS 09/17/2007  . Hyperlipemia   . HYPERLIPIDEMIA 03/31/2007  . Hypertension   . HYPERTENSION 11/25/2006  . HYPOTHYROIDISM 10/28/2007  . Impaired fasting glucose 12/26/2006  . Lateral epicondylitis  of elbow 05/05/2007  . Sciatica 10/28/2007  . SHOULDER PAIN, RIGHT 08/18/2008   Past Surgical History:  Procedure Laterality Date  . ACHILLES TENDON REPAIR     left  . ELBOW SURGERY  2005   left  . herniated lumbar disc  1997, 2011  . KNEE ARTHROSCOPY  2007   left  . ROTATOR CUFF REPAIR  1997   right   Social History   Social History  . Marital status: Married    Spouse name: N/A  . Number of children: N/A  . Years of education: N/A   Social History Main Topics  . Smoking status: Never Smoker    . Smokeless tobacco: Never Used  . Alcohol use 1.8 oz/week    3 Cans of beer per week  . Drug use: No  . Sexual activity: Yes   Other Topics Concern  . Not on file   Social History Narrative  . No narrative on file   No Known Allergies Family History  Problem Relation Age of Onset  . Diabetes Mother   . Heart disease Mother   . Hypertension Mother   . Heart disease Father   . Diabetes Sister   . Colon cancer Neg Hx     Past medical history, social, surgical and family history all reviewed in electronic medical record.  No pertanent information unless stated regarding to the chief complaint.   Review of Systems:Review of systems updated and as accurate as of 05/29/16  No headache, visual changes, nausea, vomiting, diarrhea, constipation, dizziness, abdominal pain, skin rash, fevers, chills, night sweats, weight loss, swollen lymph nodes, body aches, joint swelling, muscle aches, chest pain, shortness of breath, mood changes.   Objective  There were no vitals taken for this visit. Systems examined below as of 05/29/16   General: No apparent distress alert and oriented x3 mood and affect normal, dressed appropriately.  HEENT: Pupils equal, extraocular movements intact  Respiratory: Patient's speak in full sentences and does not appear short of breath  Cardiovascular: No lower extremity edema, non  tender, no erythema  Skin: Warm dry intact with no signs of infection or rash on extremities or on axial skeleton.  Abdomen: Soft nontender  Neuro: Cranial nerves II through XII are intact, neurovascularly intact in all extremities with 2+ DTRs and 2+ pulses.  Lymph: No lymphadenopathy of posterior or anterior cervical chain or axillae bilaterally.  Gait normal with good balance and coordination.  MSK:  Non tender with full range of motion and good stability and symmetric strength and tone of shoulders, elbows, wrist, hip and ankles bilaterally.  Knee: Left Normal to inspection with  no erythema or effusion or obvious bony abnormalities. Very minimal tenderness over the patellofemoral joint. ROM full in flexion and extension and lower leg rotation. Ligaments with solid consistent endpoints including ACL, PCL, LCL, MCL. Negative Mcmurray's, Apley's, and Thessalonian tests. Non painful patellar compression. Patellar glide without crepitus. Patellar and quadriceps tendons unremarkable. Hamstring and quadriceps strength is normal.    MSK US performed of: Bilateral This study was ordered, performed, and interpreted by Charlann Boxer D.O.  Knee: Mild narrowing of the patellofemoral joint bilaterally. Patient's left knee does show postsurgical changes of the medial meniscus. Patient's right knee shows the patient does have a chronic meniscal tear noted.  IMPRESSION:  Mild patellofemoral arthritis   Procedure note 54008; 15 minutes spent for Therapeutic exercises as stated in above notes.  This included exercises focusing on stretching, strengthening, with significant focus on eccentric aspects.Patellofemoral Syndrome  Reviewed anatomy using anatomical model and how PFS occurs.  Given rehab exercises handout for VMO, hip abductors, core, entire kinetic chain including proprioception exercises including cone touches, step downs, hip elevations and turn outs.  Could benefit from PT, regular exercise, upright biking, and a PFS knee brace to assist with tracking abnormalities.    Proper technique shown and discussed handout in great detail with ATC.  All questions were discussed and answered.     Impression and Recommendations:     This case required medical decision making of moderate complexity.      Note: This dictation was prepared with Dragon dictation along with smaller phrase technology. Any transcriptional errors that result from this process are unintentional.

## 2016-05-30 ENCOUNTER — Ambulatory Visit (INDEPENDENT_AMBULATORY_CARE_PROVIDER_SITE_OTHER): Payer: 59 | Admitting: Family Medicine

## 2016-05-30 ENCOUNTER — Encounter: Payer: Self-pay | Admitting: Family Medicine

## 2016-05-30 ENCOUNTER — Ambulatory Visit: Payer: Self-pay

## 2016-05-30 VITALS — BP 138/88 | HR 86 | Ht 66.0 in | Wt 168.0 lb

## 2016-05-30 DIAGNOSIS — M25562 Pain in left knee: Secondary | ICD-10-CM | POA: Diagnosis not present

## 2016-05-30 DIAGNOSIS — M1710 Unilateral primary osteoarthritis, unspecified knee: Secondary | ICD-10-CM | POA: Diagnosis not present

## 2016-05-30 MED ORDER — DICLOFENAC SODIUM 2 % TD SOLN: 2.0000 g | Freq: Two times a day (BID) | TRANSDERMAL | 3 refills | Status: DC

## 2016-05-30 NOTE — Assessment & Plan Note (Signed)
Mild arthritis. Patient will try topical anti-inflammatories, icing regimen, home exercises. Patient has been very active. Patient does have a chronic meniscal degenerative changes but nothing severe. We discussed with patient at great length about anything such as locking or giving out on him and when to seek medical attention. Patient will try the conservative therapy and come back and see me again in 4-6 weeks. Worsening symptoms we'll consider injection, x-rays and formal physical therapy.

## 2016-05-30 NOTE — Patient Instructions (Signed)
Good to see you.  Ice 20 minutes 2 times daily. Usually after activity and before bed. pennsaid pinkie amount topically 2 times daily as needed.  Exercises 3 times a week.  Avoid twisting motions Over the counter  Turmeric 500mg  twice daily  Vitamin D at least 2000 Iu daily  Tart cherry extract any dose at night Iron 65mg  daily with 500mg  of vitamin C Lower impact exercises like biking or elliptical would be better See me again in 4-6 weeks.

## 2016-06-11 ENCOUNTER — Telehealth: Payer: Self-pay | Admitting: Endocrinology

## 2016-06-11 ENCOUNTER — Other Ambulatory Visit: Payer: Self-pay | Admitting: Internal Medicine

## 2016-06-11 NOTE — Telephone Encounter (Signed)
Pt is asking for iron levels to be drawn on Thursday can we add the order

## 2016-06-11 NOTE — Telephone Encounter (Signed)
He needs to ask his PCP to enter the orders with the diagnosis and we can draw the labs

## 2016-06-12 NOTE — Telephone Encounter (Signed)
OK -- CBC, TIBC, iron Thx

## 2016-06-12 NOTE — Telephone Encounter (Signed)
Informed Pt of Dr. Ronnie Derby message, Pt voiced understanding.

## 2016-06-12 NOTE — Telephone Encounter (Signed)
Patient called into office about this. Please advise if we can put orders in for his iron to be drawn.

## 2016-06-12 NOTE — Telephone Encounter (Signed)
Left a vm with instructions for him to call his PCP to get the labs ordered and I requested a call back if he had any questions

## 2016-06-12 NOTE — Telephone Encounter (Signed)
Can we order a iron level lab for this patient?

## 2016-06-13 NOTE — Telephone Encounter (Signed)
What diagnosis should I should for the iron? I don't see an anemia in his problem list.

## 2016-06-14 ENCOUNTER — Other Ambulatory Visit (INDEPENDENT_AMBULATORY_CARE_PROVIDER_SITE_OTHER): Payer: 59

## 2016-06-14 DIAGNOSIS — E119 Type 2 diabetes mellitus without complications: Secondary | ICD-10-CM

## 2016-06-14 LAB — HEMOGLOBIN A1C: HEMOGLOBIN A1C: 6.9 % — AB (ref 4.6–6.5)

## 2016-06-14 LAB — BASIC METABOLIC PANEL
BUN: 14 mg/dL (ref 6–23)
CALCIUM: 9.4 mg/dL (ref 8.4–10.5)
CHLORIDE: 104 meq/L (ref 96–112)
CO2: 31 meq/L (ref 19–32)
Creatinine, Ser: 1.31 mg/dL (ref 0.40–1.50)
GFR: 58.49 mL/min — ABNORMAL LOW (ref >60.00)
Glucose, Bld: 131 mg/dL — ABNORMAL HIGH (ref 70–99)
Potassium: 4.8 mEq/L (ref 3.5–5.1)
SODIUM: 139 meq/L (ref 135–145)

## 2016-06-14 LAB — LIPID PANEL
CHOL/HDL RATIO: 3
CHOLESTEROL: 148 mg/dL (ref 0–200)
HDL: 43.9 mg/dL (ref >39.00)
LDL Cholesterol: 88 mg/dL (ref 0–99)
NonHDL: 103.87
TRIGLYCERIDES: 81 mg/dL (ref 0.0–149.0)
VLDL: 16.2 mg/dL (ref 0.0–40.0)

## 2016-06-14 NOTE — Telephone Encounter (Signed)
Pls use abn LFTs Pls tell pt the test may not be covered by ins Thx

## 2016-06-15 NOTE — Telephone Encounter (Signed)
Tests added on 

## 2016-06-18 ENCOUNTER — Encounter: Payer: Self-pay | Admitting: Endocrinology

## 2016-06-18 ENCOUNTER — Ambulatory Visit (INDEPENDENT_AMBULATORY_CARE_PROVIDER_SITE_OTHER): Payer: 59 | Admitting: Endocrinology

## 2016-06-18 VITALS — BP 110/72 | HR 74 | Ht 66.0 in | Wt 169.0 lb

## 2016-06-18 DIAGNOSIS — E1165 Type 2 diabetes mellitus with hyperglycemia: Secondary | ICD-10-CM

## 2016-06-18 NOTE — Patient Instructions (Addendum)
Restart exercise daily  Check blood sugars on waking up    Also check blood sugars about 2 hours after a meal and do this after different meals by rotation  Recommended blood sugar levels on waking up is 90-130 and about 2 hours after meal is 130-160  Please bring your blood sugar monitor to each visit, thank you

## 2016-06-18 NOTE — Progress Notes (Signed)
Patient ID: Ricardo [name], male   DOB: Jul 24, 1950, 66 y.o.   MRN: [id number]           Reason for Appointment: Follow-up for Type 2 Diabetes  Referring physician:  [name]   History of Present Illness:          Diagnosis: Type 2 diabetes mellitus, date of diagnosis: 2009       Past history: At diagnosis he was having symptoms of fatigue.  He was started on glipizide initially and subsequently metformin was added.  Further details are not available However he has been somewhat irregular with his follow-up and did not have an A1c with his PCP between 8/13 and 11/15 He was referred here because of rising A1c of 7.5  Recent history:   He had been started on Trulicity in 7/00 because of gradually increasing A1c and periodic blood sugars over 200 at home Actos was continued along with his glipizide ER 10 mg and also metformin  His A1c is back down to 6.9, previously was 7.4 in   Current blood sugar patterns, problems identified and management:  His blood sugars are overall better with improved A1c  This is from his going back to Trulicity consistently, previously had difficulty affording this  He is checking his blood sugars mostly in the last 2 weeks with his One Touch meter and although overall they are fairly good he has sporadic high readings after all meals based on his diet or snacks  He has not been exercising even though he thinks he can do an exercise bike without hurting his knee  Asking about taking his Trulicity in his legs, finds it uncomfortable in his abdomen  His weight is slightly better  Only occasionally he will have a feeling of hypoglycemia when he is going too long without eating and being busy working outside        Oral hypoglycemic drugs the patient is taking are: glipizide ER 10 mg, metformin 1 g twice a day, Actos 15 mg daily       Side effects from medications have been: None Compliance with the medical regimen: Fair Hypoglycemia: Rarely     Glucose monitoring:  done one time a day         Glucometer:  Accu-Chek  Blood Glucose readings   Mean values apply above for all meters except median for One Touch  PRE-MEAL Fasting Lunch Dinner Bedtime Overall  Glucose range:  10 5-130       Mean/median: 126    129+/-35    POST-MEAL PC Breakfast PC Lunch PC Dinner  Glucose range: 111-170  1 25-212  10 8-225   Mean/median:        Mean values apply above for all meters except median for One Touch  PRE-MEAL Fasting Lunch Afternoon  Bedtime Overall  Glucose range: 91-158    107-284    Mean/median: 126   142  140  132     Self-care:  Meals: 3 meals per day. Breakfast is oatmeal/cereal/eggs and toast.  Has sandwich or soup at lunch, avoiding rice and carbs usually       Exercise: off and on limited by knee pain  Dietician visit, most recent: at diagnosis               Weight history:   Wt Readings from Last 3 Encounters:  06/18/16 169 lb (76.7 kg)  05/30/16 168 lb (76.2 kg)  05/16/16 171 lb (77.6 kg)    Glycemic control:  Lab Results  Component Value Date   HGBA1C 6.9 (H) 06/14/2016   HGBA1C 7.4 (H) 02/13/2016   HGBA1C 6.7 (H) 11/14/2015   Lab Results  Component Value Date   MICROALBUR <0.7 02/13/2016   LDLCALC 88 06/14/2016   CREATININE 1.31 06/14/2016   Lab on 06/14/2016  Component Date Value Ref Range Status  . Hgb A1c MFr Bld 06/14/2016 6.9* 4.6 - 6.5 % Final  . Sodium 06/14/2016 139  135 - 145 mEq/L Final  . Potassium 06/14/2016 4.8  3.5 - 5.1 mEq/L Final  . Chloride 06/14/2016 104  96 - 112 mEq/L Final  . CO2 06/14/2016 31  19 - 32 mEq/L Final  . Glucose, Bld 06/14/2016 131* 70 - 99 mg/dL Final  . BUN 06/14/2016 14  6 - 23 mg/dL Final  . Creatinine, Ser 06/14/2016 1.31  0.40 - 1.50 mg/dL Final  . Calcium 06/14/2016 9.4  8.4 - 10.5 mg/dL Final  . GFR 06/14/2016 58.49* >60.00 mL/min Final  . Cholesterol 06/14/2016 148  0 - 200 mg/dL Final  . Triglycerides 06/14/2016 81.0  0.0 - 149.0 mg/dL Final   . HDL 06/14/2016 43.90  >39.00 mg/dL Final  . VLDL 06/14/2016 16.2  0.0 - 40.0 mg/dL Final  . LDL Cholesterol 06/14/2016 88  0 - 99 mg/dL Final  . Total CHOL/HDL Ratio 06/14/2016 3   Final  . NonHDL 06/14/2016 103.87   Final     Allergies as of 06/18/2016   No Known Allergies     Medication List       Accurate as of 06/18/16  8:19 AM. Always use your most recent med list.          aspirin 81 MG tablet Take 81 mg by mouth daily.   atorvastatin 20 MG tablet Commonly known as:  LIPITOR Take 1 tablet (20 mg total) by mouth daily.   Diclofenac Sodium 2 % Soln Place 2 g onto the skin 2 (two) times daily.   Dulaglutide 0.75 MG/0.5ML Sopn Commonly known as:  TRULICITY INJECT IN THE ABDOMINAL SKIN AS DIRECTED ONCE A WEEK   glipiZIDE 10 MG 24 hr tablet Commonly known as:  GLUCOTROL XL Take 1 tablet (10 mg total) by mouth daily.   levothyroxine 100 MCG tablet Commonly known as:  SYNTHROID, LEVOTHROID TAKE 1 TABLET (100 MCG TOTAL) BY MOUTH DAILY.   losartan 100 MG tablet Commonly known as:  COZAAR TAKE 1 TABLET BY MOUTH ONCE DAILY   metFORMIN 1000 MG tablet Commonly known as:  GLUCOPHAGE Take 1 tablet (1,000 mg total) by mouth 2 (two) times daily.   pioglitazone 15 MG tablet Commonly known as:  ACTOS Take 1 tablet (15 mg total) by mouth daily.   sitaGLIPtin 100 MG tablet Commonly known as:  JANUVIA TAKE 1 TABLET (100 MG TOTAL) BY MOUTH DAILY.   terazosin 1 MG capsule Commonly known as:  HYTRIN Take 1 capsule (1 mg total) by mouth at bedtime.   valACYclovir 500 MG tablet Commonly known as:  VALTREX Take 1 tablet (500 mg total) by mouth daily.       Allergies: No Known Allergies  Past Medical History:  Diagnosis Date  . ARTHRITIS, HIP 08/10/2008  . BURSITIS, RIGHT SHOULDER 05/20/2008  . Degeneration of lumbar or lumbosacral intervertebral disc 11/27/2006  . DEGENERATION, CERVICAL DISC 11/27/2006  . Degenerative disc disease, lumbar   . Depression   .  DEPRESSION 11/25/2006  . Diabetes mellitus   . DIABETES MELLITUS, TYPE II 04/10/2007  . Epicondylitis  bilateral  . Fatigue   . HERPES GENITALIS 09/17/2007  . Hyperlipemia   . HYPERLIPIDEMIA 03/31/2007  . Hypertension   . HYPERTENSION 11/25/2006  . HYPOTHYROIDISM 10/28/2007  . Impaired fasting glucose 12/26/2006  . Lateral epicondylitis  of elbow 05/05/2007  . Sciatica 10/28/2007  . SHOULDER PAIN, RIGHT 08/18/2008    Past Surgical History:  Procedure Laterality Date  . ACHILLES TENDON REPAIR     left  . ELBOW SURGERY  2005   left  . herniated lumbar disc  1997, 2011  . KNEE ARTHROSCOPY  2007   left  . ROTATOR CUFF REPAIR  1997   right    Family History  Problem Relation Age of Onset  . Diabetes Mother   . Heart disease Mother   . Hypertension Mother   . Heart disease Father   . Diabetes Sister   . Colon cancer Neg Hx     Social History:  reports that he has never smoked. He has never used smokeless tobacco. He reports that he drinks about 1.8 oz of alcohol per week . He reports that he does not use drugs.    Review of Systems         Lipids: He has been treated with Lipitor 20 mg  Previously had been inconsistently compliant with this LDL is below 100  He has been treated by his PCP      Lab Results  Component Value Date   CHOL 148 06/14/2016   HDL 43.90 06/14/2016   LDLCALC 88 06/14/2016   LDLDIRECT 126.0 05/12/2014   TRIG 81.0 06/14/2016   CHOLHDL 3 06/14/2016                   Thyroid: He has been on Synthroid for the last few years, initially had symptoms of fatigue at diagnosis  Lab Results  Component Value Date   TSH 2.13 02/13/2016   TSH 1.82 02/09/2015   TSH 2.20 12/31/2013   Diabetic foot exam normal in 1/18    LABS:  Lab on 06/14/2016  Component Date Value Ref Range Status  . Hgb A1c MFr Bld 06/14/2016 6.9* 4.6 - 6.5 % Final  . Sodium 06/14/2016 139  135 - 145 mEq/L Final  . Potassium 06/14/2016 4.8  3.5 - 5.1 mEq/L Final  . Chloride  06/14/2016 104  96 - 112 mEq/L Final  . CO2 06/14/2016 31  19 - 32 mEq/L Final  . Glucose, Bld 06/14/2016 131* 70 - 99 mg/dL Final  . BUN 06/14/2016 14  6 - 23 mg/dL Final  . Creatinine, Ser 06/14/2016 1.31  0.40 - 1.50 mg/dL Final  . Calcium 06/14/2016 9.4  8.4 - 10.5 mg/dL Final  . GFR 06/14/2016 58.49* >60.00 mL/min Final  . Cholesterol 06/14/2016 148  0 - 200 mg/dL Final  . Triglycerides 06/14/2016 81.0  0.0 - 149.0 mg/dL Final  . HDL 06/14/2016 43.90  >39.00 mg/dL Final  . VLDL 06/14/2016 16.2  0.0 - 40.0 mg/dL Final  . LDL Cholesterol 06/14/2016 88  0 - 99 mg/dL Final  . Total CHOL/HDL Ratio 06/14/2016 3   Final  . NonHDL 06/14/2016 103.87   Final    Physical Examination:  BP 110/72   Pulse 74   Ht 5\' 6"  (1.676 m)   Wt 169 lb (76.7 kg)   BMI 27.28 kg/m              ASSESSMENT:  Diabetes type 2, nonobese.  See history of present illness for detailed  discussion of his current management, blood sugar patterns and problems identified  He has finally started taking his Trulicity regularly and his A1c is below 7 He has somewhat sporadically increase in blood sugars after meals based on his meal choices and carbohydrates Although his weight is slightly lower he has not exercised as yet Fasting readings are relatively better He is somewhat concerned about the long-term side effects of Actos and reassured him that the potential is extremely small if any for osteopenia and bladder cancer, he is also on the lowest dose as well as has benefits of potentially reducing cardiovascular risks and improving lipids   PLAN:   Continue same regimen  Including Actos and Trulicity   He will try to see the dieitian but he Does not want to make an appointment yet, discussed need for specific meal plans and to involve his wife in his day-to-day diet and choices  More readings after meals.  He can start more aerobic exercise with his exercise bike which he can do safely  Call if  postprandial readings are consistently high  Reassured him that he has much more potential benefit from taking Actos long-term versus any side effects   Continue all  medications unchanged   There are no Patient Instructions on file for this visit.  Shanai Lartigue 06/18/2016, 8:19 AM   Note: This office note was prepared with Dragon voice recognition system technology. Any transcriptional errors that result from this process are unintentional.

## 2016-06-20 ENCOUNTER — Other Ambulatory Visit: Payer: Self-pay | Admitting: Internal Medicine

## 2016-08-24 ENCOUNTER — Ambulatory Visit: Payer: 59 | Admitting: Internal Medicine

## 2016-11-05 ENCOUNTER — Other Ambulatory Visit: Payer: 59

## 2016-11-08 ENCOUNTER — Ambulatory Visit: Payer: 59 | Admitting: Endocrinology

## 2017-01-24 ENCOUNTER — Other Ambulatory Visit: Payer: Self-pay | Admitting: Internal Medicine

## 2017-02-10 ENCOUNTER — Other Ambulatory Visit: Payer: Self-pay | Admitting: Internal Medicine

## 2017-02-25 ENCOUNTER — Encounter: Payer: Self-pay | Admitting: Internal Medicine

## 2017-02-25 ENCOUNTER — Other Ambulatory Visit (INDEPENDENT_AMBULATORY_CARE_PROVIDER_SITE_OTHER): Payer: 59

## 2017-02-25 ENCOUNTER — Ambulatory Visit (INDEPENDENT_AMBULATORY_CARE_PROVIDER_SITE_OTHER): Payer: 59 | Admitting: Internal Medicine

## 2017-02-25 VITALS — BP 142/80 | HR 68 | Temp 97.7°F | Ht 66.0 in | Wt 168.0 lb

## 2017-02-25 DIAGNOSIS — E119 Type 2 diabetes mellitus without complications: Secondary | ICD-10-CM | POA: Diagnosis not present

## 2017-02-25 DIAGNOSIS — Z0001 Encounter for general adult medical examination with abnormal findings: Secondary | ICD-10-CM

## 2017-02-25 DIAGNOSIS — M7660 Achilles tendinitis, unspecified leg: Secondary | ICD-10-CM

## 2017-02-25 DIAGNOSIS — Z0000 Encounter for general adult medical examination without abnormal findings: Secondary | ICD-10-CM

## 2017-02-25 DIAGNOSIS — G8929 Other chronic pain: Secondary | ICD-10-CM

## 2017-02-25 DIAGNOSIS — E785 Hyperlipidemia, unspecified: Secondary | ICD-10-CM

## 2017-02-25 DIAGNOSIS — M25511 Pain in right shoulder: Secondary | ICD-10-CM | POA: Insufficient documentation

## 2017-02-25 DIAGNOSIS — I10 Essential (primary) hypertension: Secondary | ICD-10-CM

## 2017-02-25 DIAGNOSIS — E034 Atrophy of thyroid (acquired): Secondary | ICD-10-CM

## 2017-02-25 DIAGNOSIS — N320 Bladder-neck obstruction: Secondary | ICD-10-CM

## 2017-02-25 LAB — BASIC METABOLIC PANEL
BUN: 15 mg/dL (ref 6–23)
CHLORIDE: 99 meq/L (ref 96–112)
CO2: 32 meq/L (ref 19–32)
CREATININE: 1.09 mg/dL (ref 0.40–1.50)
Calcium: 9.5 mg/dL (ref 8.4–10.5)
GFR: 72.15 mL/min (ref >60.00)
Glucose, Bld: 166 mg/dL — ABNORMAL HIGH (ref 70–99)
POTASSIUM: 5 meq/L (ref 3.5–5.1)
Sodium: 138 mEq/L (ref 135–145)

## 2017-02-25 LAB — HEPATIC FUNCTION PANEL
ALBUMIN: 4.4 g/dL (ref 3.5–5.2)
ALK PHOS: 30 U/L — AB (ref 39–117)
ALT: 11 U/L (ref 0–53)
AST: 12 U/L (ref 0–37)
Bilirubin, Direct: 0.1 mg/dL (ref 0.0–0.3)
Total Bilirubin: 0.8 mg/dL (ref 0.2–1.2)
Total Protein: 7.1 g/dL (ref 6.0–8.3)

## 2017-02-25 LAB — LIPID PANEL
CHOLESTEROL: 235 mg/dL — AB (ref 0–200)
HDL: 52.5 mg/dL (ref >39.00)
LDL CALC: 155 mg/dL — AB (ref 0–99)
NONHDL: 182.54
Total CHOL/HDL Ratio: 4
Triglycerides: 138 mg/dL (ref 0.0–149.0)
VLDL: 27.6 mg/dL (ref 0.0–40.0)

## 2017-02-25 LAB — CBC WITH DIFFERENTIAL/PLATELET
BASOS ABS: 0.1 10*3/uL (ref 0.0–0.1)
BASOS PCT: 1 % (ref 0.0–3.0)
EOS ABS: 0.2 10*3/uL (ref 0.0–0.7)
Eosinophils Relative: 3.5 % (ref 0.0–5.0)
HEMATOCRIT: 43.1 % (ref 39.0–52.0)
HEMOGLOBIN: 14.3 g/dL (ref 13.0–17.0)
LYMPHS PCT: 26.2 % (ref 12.0–46.0)
Lymphs Abs: 1.7 10*3/uL (ref 0.7–4.0)
MCHC: 33.1 g/dL (ref 30.0–36.0)
MCV: 90.9 fl (ref 78.0–100.0)
MONO ABS: 0.6 10*3/uL (ref 0.1–1.0)
Monocytes Relative: 9.9 % (ref 3.0–12.0)
Neutro Abs: 3.8 10*3/uL (ref 1.4–7.7)
Neutrophils Relative %: 59.4 % (ref 43.0–77.0)
Platelets: 216 10*3/uL (ref 150.0–400.0)
RBC: 4.74 Mil/uL (ref 4.22–5.81)
RDW: 13.4 % (ref 11.5–15.5)
WBC: 6.4 10*3/uL (ref 4.0–10.5)

## 2017-02-25 LAB — URINALYSIS
Bilirubin Urine: NEGATIVE
HGB URINE DIPSTICK: NEGATIVE
Ketones, ur: NEGATIVE
LEUKOCYTES UA: NEGATIVE
NITRITE: NEGATIVE
SPECIFIC GRAVITY, URINE: 1.015 (ref 1.000–1.030)
Total Protein, Urine: NEGATIVE
UROBILINOGEN UA: 0.2 (ref 0.0–1.0)
Urine Glucose: NEGATIVE
pH: 6 (ref 5.0–8.0)

## 2017-02-25 LAB — HEMOGLOBIN A1C: HEMOGLOBIN A1C: 8.4 % — AB (ref 4.6–6.5)

## 2017-02-25 LAB — TSH: TSH: 3.62 u[IU]/mL (ref 0.35–4.50)

## 2017-02-25 MED ORDER — METHYLPREDNISOLONE ACETATE 80 MG/ML IJ SUSP
80.0000 mg | Freq: Once | INTRAMUSCULAR | Status: AC
  Administered 2017-02-25: 80 mg via INTRA_ARTICULAR

## 2017-02-25 MED ORDER — MIRABEGRON ER 50 MG PO TB24: 50.0000 mg | ORAL_TABLET | Freq: Every day | ORAL | 3 refills | Status: DC

## 2017-02-25 NOTE — Assessment & Plan Note (Signed)
Will inject °

## 2017-02-25 NOTE — Assessment & Plan Note (Signed)
Labs

## 2017-02-25 NOTE — Progress Notes (Signed)
Subjective:  Patient ID: Ricardo [name], male    DOB: 22-Jun-1950  Age: 66 y.o. MRN: [id number]  CC: No chief complaint on file.   HPI Ricardo [name] presents for a well exam C/o R shoulder pain - worse C/o pain in the R achilles tendon C/o prostate issues. Myrbetriq has helped in the past  Outpatient Medications Prior to Visit  Medication Sig Dispense Refill  . aspirin 81 MG tablet Take 81 mg by mouth daily.      [name] atorvastatin (LIPITOR) 20 MG tablet Take 1 tablet (20 mg total) by mouth daily. 90 tablet 3  . Diclofenac Sodium 2 % SOLN Place 2 g onto the skin 2 (two) times daily. 112 g 3  . Dulaglutide (TRULICITY) 7.34 LP/3.7TK SOPN INJECT IN THE ABDOMINAL SKIN AS DIRECTED ONCE A WEEK 12 pen 1  . glipiZIDE (GLUCOTROL XL) 10 MG 24 hr tablet TAKE 1 TABLET (10 MG TOTAL) BY MOUTH DAILY. 90 tablet 0  . levothyroxine (SYNTHROID, LEVOTHROID) 100 MCG tablet TAKE 1 TABLET (100 MCG TOTAL) BY MOUTH DAILY. 90 tablet 3  . losartan (COZAAR) 100 MG tablet TAKE 1 TABLET BY MOUTH ONCE DAILY 90 tablet 0  . metFORMIN (GLUCOPHAGE) 1000 MG tablet Take 1 tablet (1,000 mg total) by mouth 2 (two) times daily. Patient needs office visit before refills will be given 180 tablet 0  . pioglitazone (ACTOS) 15 MG tablet Take 1 tablet (15 mg total) by mouth daily. 30 tablet 2  . terazosin (HYTRIN) 1 MG capsule Take 1 capsule (1 mg total) by mouth at bedtime. 90 capsule 3  . valACYclovir (VALTREX) 500 MG tablet Take 1 tablet (500 mg total) by mouth daily. 90 tablet 3   No facility-administered medications prior to visit.     ROS Review of Systems  Constitutional: Negative for appetite change, fatigue and unexpected weight change.  HENT: Negative for congestion, nosebleeds, sneezing, sore throat and trouble swallowing.   Eyes: Negative for itching and visual disturbance.  Respiratory: Negative for cough.   Cardiovascular: Negative for chest pain, palpitations and leg swelling.  Gastrointestinal: Negative for  abdominal distention, blood in stool, diarrhea and nausea.  Genitourinary: Positive for frequency. Negative for hematuria.  Musculoskeletal: Positive for arthralgias and gait problem. Negative for back pain, joint swelling and neck pain.  Skin: Negative for rash.  Neurological: Negative for dizziness, tremors, speech difficulty and weakness.  Psychiatric/Behavioral: Negative for agitation, dysphoric mood and sleep disturbance. The patient is not nervous/anxious.     Objective:  BP (!) 142/80 (BP Location: Left Arm, Patient Position: Sitting, Cuff Size: Large)   Pulse 68   Temp 97.7 F (36.5 C) (Oral)   Ht 5\' 6"  (1.676 m)   Wt 168 lb (76.2 kg)   SpO2 99%   BMI 27.12 kg/m   BP Readings from Last 3 Encounters:  02/25/17 (!) 142/80  06/18/16 110/72  05/30/16 138/88    Wt Readings from Last 3 Encounters:  02/25/17 168 lb (76.2 kg)  06/18/16 169 lb (76.7 kg)  05/30/16 168 lb (76.2 kg)    Physical Exam  Constitutional: He is oriented to person, place, and time. He appears well-developed. No distress.  NAD  HENT:  Mouth/Throat: Oropharynx is clear and moist.  Eyes: Conjunctivae are normal. Pupils are equal, round, and reactive to light.  Neck: Normal range of motion. No JVD present. No thyromegaly present.  Cardiovascular: Normal rate, regular rhythm, normal heart sounds and intact distal pulses. Exam reveals no gallop and no friction rub.  No murmur heard. Pulmonary/Chest: Effort normal and breath sounds normal. No respiratory distress. He has no wheezes. He has no rales. He exhibits no tenderness.  Abdominal: Soft. Bowel sounds are normal. He exhibits no distension and no mass. There is no tenderness. There is no rebound and no guarding.  Musculoskeletal: Normal range of motion. He exhibits tenderness. He exhibits no edema.  Lymphadenopathy:    He has no cervical adenopathy.  Neurological: He is alert and oriented to person, place, and time. He has normal reflexes. No cranial  nerve deficit. He exhibits normal muscle tone. He displays a negative Romberg sign. Coordination and gait normal.  Skin: Skin is warm and dry. No rash noted.  Psychiatric: He has a normal mood and affect. His behavior is normal. Judgment and thought content normal.  R heel is tender R shoulder is tender   Procedure :Joint Injection, R  shoulder   Indication:  Subacromial bursitis with refractory  chronic pain.   Risks including unsuccessful procedure , bleeding, infection, bruising, skin atrophy, "steroid flare-up" and others were explained to the patient in detail as well as the benefits. Informed consent was obtained and signed.   Tthe patient was placed in a comfortable position. Lateral approach was used. Skin was prepped with Betadine and alcohol  and anesthetized with a cooling spray. Then, a 5 cc syringe with a 2 inch long 24-gauge needle was used for a joint injection.. The needle was advanced  Into the subacromial space.The bursa was injected with 3 mL of 2% lidocaine and 80 mg of Depo-Medrol .  Band-Aid was applied.   Tolerated well. Complications: None. Good pain relief following the procedure.   Postprocedure instructions :    A Band-Aid should be left on for 12 hours. Injection therapy is not a cure itself. It is used in conjunction with other modalities. You can use nonsteroidal anti-inflammatories like ibuprofen , hot and cold compresses. Rest is recommended in the next 24 hours. You need to report immediately  if fever, chills or any signs of infection develop.    Lab Results  Component Value Date   WBC 8.8 02/13/2016   HGB 14.5 02/13/2016   HCT 43.1 02/13/2016   PLT 231.0 02/13/2016   GLUCOSE 131 (H) 06/14/2016   CHOL 148 06/14/2016   TRIG 81.0 06/14/2016   HDL 43.90 06/14/2016   LDLDIRECT 126.0 05/12/2014   LDLCALC 88 06/14/2016   ALT 12 02/13/2016   AST 10 02/13/2016   NA 139 06/14/2016   K 4.8 06/14/2016   CL 104 06/14/2016   CREATININE 1.31 06/14/2016    BUN 14 06/14/2016   CO2 31 06/14/2016   TSH 2.13 02/13/2016   PSA 4.81 (H) 02/13/2016   INR 1.0 04/02/2007   HGBA1C 6.9 (H) 06/14/2016   MICROALBUR <0.7 02/13/2016    Dg Cervical Spine Complete  Result Date: 11/28/2014 CLINICAL DATA:  Neck pain and stiffness which began this morning EXAM: CERVICAL SPINE  4+ VIEWS COMPARISON:  None. FINDINGS: Straightening of the cervical lordosis. No fracture. No prevertebral soft tissue swelling. There is mild degenerative disc disease at C3-4, C4-5, C5-6, and C6-7. There is C4-5 and C5-6 moderate foraminal narrowing, with mild foraminal narrowing above and below this level, bilaterally. Mild left carotid calcification. IMPRESSION: Degenerative changes with no acute findings Electronically Signed   By: Skipper Cliche M.D.   On: 11/28/2014 17:49    Assessment & Plan:   There are no diagnoses linked to this encounter. I am having Ricardo [name]  maintain his aspirin, pioglitazone, terazosin, levothyroxine, Dulaglutide, valACYclovir, atorvastatin, Diclofenac Sodium, losartan, metFORMIN, and glipiZIDE.  No orders of the defined types were placed in this encounter.    Follow-up: No Follow-up on file.  Walker Kehr, MD

## 2017-02-25 NOTE — Assessment & Plan Note (Signed)
Losartan 

## 2017-02-25 NOTE — Assessment & Plan Note (Signed)
Worse Added Myrbetriq

## 2017-02-25 NOTE — Addendum Note (Signed)
Addended by: Karren Cobble on: 02/25/2017 12:05 PM   Modules accepted: Orders

## 2017-02-25 NOTE — Assessment & Plan Note (Signed)
Heel lift 1/4 inch

## 2017-02-25 NOTE — Patient Instructions (Addendum)
Heel lift 1/4 inch    Postprocedure instructions :    A Band-Aid should be left on for 12 hours. Injection therapy is not a cure itself. It is used in conjunction with other modalities. You can use nonsteroidal anti-inflammatories like ibuprofen , hot and cold compresses. Rest is recommended in the next 24 hours. You need to report immediately  if fever, chills or any signs of infection develop.

## 2017-02-25 NOTE — Assessment & Plan Note (Signed)
We discussed age appropriate health related issues, including available/recomended screening tests and vaccinations. We discussed a need for adhering to healthy diet and exercise. Labs/EKG were reviewed/ordered. All questions were answered.  Eye exam q 12 mo 

## 2017-03-25 LAB — HM DIABETES EYE EXAM

## 2017-04-14 ENCOUNTER — Other Ambulatory Visit: Payer: Self-pay | Admitting: Internal Medicine

## 2017-04-30 ENCOUNTER — Other Ambulatory Visit: Payer: Self-pay | Admitting: Internal Medicine

## 2017-04-30 NOTE — Telephone Encounter (Signed)
Routing to dr plotnikov, please advise, thanks 

## 2017-05-24 ENCOUNTER — Other Ambulatory Visit: Payer: Self-pay | Admitting: Internal Medicine

## 2017-05-30 ENCOUNTER — Ambulatory Visit: Payer: 59 | Admitting: Interventional Cardiology

## 2017-06-04 ENCOUNTER — Telehealth: Payer: Self-pay | Admitting: Endocrinology

## 2017-06-04 ENCOUNTER — Other Ambulatory Visit: Payer: Self-pay | Admitting: Internal Medicine

## 2017-06-04 NOTE — Telephone Encounter (Signed)
Spoke with Pt. And informed that according to our records, he still has a three month supply of Metformin waiting at the pharmacy because patient stated he has not picked up his refill when it was sent in on 05/01/2017. Pt. Advised to call pharmacy and verify.

## 2017-06-04 NOTE — Telephone Encounter (Signed)
Patient needs Rx for Metformin HCL 1000 mg sent to [facility] on [address] ph# [phone]

## 2017-06-04 NOTE — Telephone Encounter (Signed)
Patient has some questions about the message he received and how he could get Dr Dwyane Dee to fill his prescription.   Please advise

## 2017-06-04 NOTE — Telephone Encounter (Signed)
Left VM letting pt know that script came from Dr. Alain Marion and will need to be filled by him.

## 2017-06-05 NOTE — Telephone Encounter (Signed)
Routing to dr plotnikov, please advise, thanks 

## 2017-06-06 ENCOUNTER — Other Ambulatory Visit: Payer: Self-pay | Admitting: Endocrinology

## 2017-06-06 ENCOUNTER — Other Ambulatory Visit (INDEPENDENT_AMBULATORY_CARE_PROVIDER_SITE_OTHER): Payer: 59

## 2017-06-06 DIAGNOSIS — E1165 Type 2 diabetes mellitus with hyperglycemia: Secondary | ICD-10-CM | POA: Diagnosis not present

## 2017-06-06 DIAGNOSIS — E119 Type 2 diabetes mellitus without complications: Secondary | ICD-10-CM

## 2017-06-06 DIAGNOSIS — N320 Bladder-neck obstruction: Secondary | ICD-10-CM

## 2017-06-06 DIAGNOSIS — Z0000 Encounter for general adult medical examination without abnormal findings: Secondary | ICD-10-CM

## 2017-06-06 LAB — COMPREHENSIVE METABOLIC PANEL
ALT: 10 U/L (ref 0–53)
AST: 10 U/L (ref 0–37)
Albumin: 4.3 g/dL (ref 3.5–5.2)
Alkaline Phosphatase: 33 U/L — ABNORMAL LOW (ref 39–117)
BILIRUBIN TOTAL: 0.8 mg/dL (ref 0.2–1.2)
BUN: 11 mg/dL (ref 6–23)
CHLORIDE: 102 meq/L (ref 96–112)
CO2: 29 mEq/L (ref 19–32)
CREATININE: 1.17 mg/dL (ref 0.40–1.50)
Calcium: 9.4 mg/dL (ref 8.4–10.5)
GFR: 66.43 mL/min (ref >60.00)
Glucose, Bld: 124 mg/dL — ABNORMAL HIGH (ref 70–99)
Potassium: 4.3 mEq/L (ref 3.5–5.1)
SODIUM: 137 meq/L (ref 135–145)
TOTAL PROTEIN: 6.9 g/dL (ref 6.0–8.3)

## 2017-06-06 LAB — MICROALBUMIN / CREATININE URINE RATIO
CREATININE, U: 78.5 mg/dL
Microalb Creat Ratio: 0.9 mg/g (ref 0.0–30.0)
Microalb, Ur: <0.7 mg/dL (ref 0.0–1.9)

## 2017-06-06 LAB — HEMOGLOBIN A1C: Hgb A1c MFr Bld: 8.3 % — ABNORMAL HIGH (ref 4.6–6.5)

## 2017-06-08 ENCOUNTER — Other Ambulatory Visit: Payer: Self-pay | Admitting: Internal Medicine

## 2017-06-10 ENCOUNTER — Ambulatory Visit (INDEPENDENT_AMBULATORY_CARE_PROVIDER_SITE_OTHER): Payer: PRIVATE HEALTH INSURANCE | Admitting: Endocrinology

## 2017-06-10 ENCOUNTER — Encounter: Payer: Self-pay | Admitting: Endocrinology

## 2017-06-10 VITALS — BP 142/84 | HR 66 | Ht 66.0 in | Wt 163.2 lb

## 2017-06-10 DIAGNOSIS — E785 Hyperlipidemia, unspecified: Secondary | ICD-10-CM

## 2017-06-10 DIAGNOSIS — E1165 Type 2 diabetes mellitus with hyperglycemia: Secondary | ICD-10-CM

## 2017-06-10 MED ORDER — PIOGLITAZONE HCL 15 MG PO TABS: 30.0000 mg | ORAL_TABLET | Freq: Every day | ORAL | 2 refills | Status: DC

## 2017-06-10 NOTE — Patient Instructions (Addendum)
Some carbs at all meals  Exercise  Call if consistently low  Actos 30mg  daily  Restart Lipitor  Check blood sugars on waking up  2-3  Also check blood sugars about 2 hours after a meal and do this after different meals by rotation  Recommended blood sugar levels on waking up is 90-130 and about 2 hours after meal is 130-160  Please bring your blood sugar monitor to each visit, thank you

## 2017-06-10 NOTE — Progress Notes (Signed)
Patient ID: Ricardo [name], male   DOB: 05-31-1950, 67 y.o.   MRN: [id number]           Reason for Appointment: Follow-up for Type 2 Diabetes  Referring physician:  [name]   History of Present Illness:          Diagnosis: Type 2 diabetes mellitus, date of diagnosis: 2009       Past history: At diagnosis he was having symptoms of fatigue.  He was started on glipizide initially and subsequently metformin was added.  Further details are not available However he has been somewhat irregular with his follow-up and did not have an A1c with his PCP between 8/13 and 11/15 He was referred here because of rising A1c of 7.5  Recent history:   He had been started on Trulicity in 5/18 because of gradually increasing A1c and periodic blood sugars over 200 at home Actos was continued along with his glipizide ER 10 mg and also metformin  Oral hypoglycemic drugs the patient is taking are: glipizide ER 10 mg, metformin 1 g twice a day, Actos 15 mg daily     He has not been seen in follow-up since 05/2016  His A1c is persistently high now with a level of 8.3-8.4 since 12/18   Current blood sugar patterns, problems identified and management:  He stopped taking Trulicity in 8416 because of out-of-pocket expense  Also he was checking his blood sugars only occasionally  He thinks over the last several months he has not watched his diet at all and not controlling portions, high fat foods or carbohydrates  He has checked his blood sugar only with a off brand meter provided by his insurance company and not able to get any download of this for evaluation of patterns  However in the last 3 weeks or so he has really started watching his diet because of higher blood sugars  He has cut back on portions, high fat foods and, carbohydrate intake  Sometimes will not have any carbohydrate especially at lunch  With this his blood sugars have been progressively lower and has not had an unusually high  reading for about 10 days, at that time he had a glucose of 240  Blood sugars have been low normal at times, lowest probably 65 before supper  More recently has not had any high readings after meals, frequently blood sugars are 140 or below after eating now  He also has lost 5 pounds        Side effects from medications have been: None Compliance with the medical regimen: Fair Hypoglycemia: Rarely    Glucose monitoring:  done one time a day         Glucometer:  [place]  Blood Glucose readings reviewed from monitor directly  Mean values apply above for all meters except median for One Touch  PRE-MEAL Fasting Lunch Dinner Bedtime Overall  Glucose range:  124, 137     57-234  Mean/median:      141     Self-care:  Meals: 3 meals per day. Breakfast is oatmeal/cereal/eggs and toast.  Has half sandwich or soup at lunch, avoiding rice and carbs usually       Exercise:  None  Dietician visit, most recent: at diagnosis               Weight history:   Wt Readings from Last 3 Encounters:  06/10/17 163 lb 3.2 oz (74 kg)  02/25/17 168 lb (76.2 kg)  06/18/16 169 lb (  76.7 kg)    Glycemic control:   Lab Results  Component Value Date   HGBA1C 8.3 (H) 06/06/2017   HGBA1C 8.4 (H) 02/25/2017   HGBA1C 6.9 (H) 06/14/2016   Lab Results  Component Value Date   MICROALBUR <0.7 06/06/2017   LDLCALC 155 (H) 02/25/2017   CREATININE 1.17 06/06/2017   Lab on 06/06/2017  Component Date Value Ref Range Status  . Microalb, Ur 06/06/2017 <0.7  0.0 - 1.9 mg/dL Final  . Creatinine,U 06/06/2017 78.5  mg/dL Final  . Microalb Creat Ratio 06/06/2017 0.9  0.0 - 30.0 mg/g Final  . Hgb A1c MFr Bld 06/06/2017 8.3* 4.6 - 6.5 % Final   Glycemic Control Guidelines for People with Diabetes:Non Diabetic:  <6%Goal of Therapy: <7%Additional Action Suggested:  >8%   . Sodium 06/06/2017 137  135 - 145 mEq/L Final  . Potassium 06/06/2017 4.3  3.5 - 5.1 mEq/L Final  . Chloride 06/06/2017 102  96 - 112 mEq/L  Final  . CO2 06/06/2017 29  19 - 32 mEq/L Final  . Glucose, Bld 06/06/2017 124* 70 - 99 mg/dL Final  . BUN 06/06/2017 11  6 - 23 mg/dL Final  . Creatinine, Ser 06/06/2017 1.17  0.40 - 1.50 mg/dL Final  . Total Bilirubin 06/06/2017 0.8  0.2 - 1.2 mg/dL Final  . Alkaline Phosphatase 06/06/2017 33* 39 - 117 U/L Final  . AST 06/06/2017 10  0 - 37 U/L Final  . ALT 06/06/2017 10  0 - 53 U/L Final  . Total Protein 06/06/2017 6.9  6.0 - 8.3 g/dL Final  . Albumin 06/06/2017 4.3  3.5 - 5.2 g/dL Final  . Calcium 06/06/2017 9.4  8.4 - 10.5 mg/dL Final  . GFR 06/06/2017 66.43  >60.00 mL/min Final     Allergies as of 06/10/2017   No Known Allergies     Medication List        Accurate as of 06/10/17 11:32 AM. Always use your most recent med list.          aspirin 81 MG tablet Take 81 mg by mouth daily.   atorvastatin 20 MG tablet Commonly known as:  LIPITOR Take 1 tablet (20 mg total) by mouth daily.   Diclofenac Sodium 2 % Soln Place 2 g onto the skin 2 (two) times daily.   Dulaglutide 0.75 MG/0.5ML Sopn Commonly known as:  TRULICITY INJECT IN THE ABDOMINAL SKIN AS DIRECTED ONCE A WEEK   glipiZIDE 10 MG 24 hr tablet Commonly known as:  GLUCOTROL XL TAKE 1 TABLET (10 MG TOTAL) BY MOUTH DAILY.   levothyroxine 100 MCG tablet Commonly known as:  SYNTHROID, LEVOTHROID TAKE 1 TABLET (100 MCG TOTAL) BY MOUTH DAILY.   losartan 100 MG tablet Commonly known as:  COZAAR TAKE 1 TABLET BY MOUTH ONCE DAILY   metFORMIN 1000 MG tablet Commonly known as:  GLUCOPHAGE TAKE 1 TAB BY MOUTH TWICE DAILY.PATIENT NEEDS OFFICE VISIT BEFORE REFILLS WILL BE GIVEN   pioglitazone 15 MG tablet Commonly known as:  ACTOS Take 1 tablet (15 mg total) by mouth daily.   terazosin 1 MG capsule Commonly known as:  HYTRIN Take 1 capsule (1 mg total) by mouth at bedtime.   valACYclovir 500 MG tablet Commonly known as:  VALTREX TAKE 1 TABLET (500 MG TOTAL) BY MOUTH DAILY.       Allergies: No Known  Allergies  Past Medical History:  Diagnosis Date  . ARTHRITIS, HIP 08/10/2008  . BURSITIS, RIGHT SHOULDER 05/20/2008  . Degeneration of  lumbar or lumbosacral intervertebral disc 11/27/2006  . DEGENERATION, CERVICAL DISC 11/27/2006  . Degenerative disc disease, lumbar   . Depression   . DEPRESSION 11/25/2006  . Diabetes mellitus   . DIABETES MELLITUS, TYPE II 04/10/2007  . Epicondylitis    bilateral  . Fatigue   . HERPES GENITALIS 09/17/2007  . Hyperlipemia   . HYPERLIPIDEMIA 03/31/2007  . Hypertension   . HYPERTENSION 11/25/2006  . HYPOTHYROIDISM 10/28/2007  . Impaired fasting glucose 12/26/2006  . Lateral epicondylitis  of elbow 05/05/2007  . Sciatica 10/28/2007  . SHOULDER PAIN, RIGHT 08/18/2008    Past Surgical History:  Procedure Laterality Date  . ACHILLES TENDON REPAIR     left  . ELBOW SURGERY  2005   left  . herniated lumbar disc  1997, 2011  . KNEE ARTHROSCOPY  2007   left  . ROTATOR CUFF REPAIR  1997   right    Family History  Problem Relation Age of Onset  . Diabetes Mother   . Heart disease Mother   . Hypertension Mother   . Heart disease Father   . Diabetes Sister   . Colon cancer Neg Hx     Social History:  reports that he has never smoked. He has never used smokeless tobacco. He reports that he drinks about 1.8 oz of alcohol per week. He reports that he does not use drugs.    Review of Systems         Lipids: He has been treated with Lipitor 20 mg  Previously had been inconsistently compliant with this LDL is high since he has not taken medication before his last labs  He has been treated by his PCP      Lab Results  Component Value Date   CHOL 235 (H) 02/25/2017   HDL 52.50 02/25/2017   LDLCALC 155 (H) 02/25/2017   LDLDIRECT 126.0 05/12/2014   TRIG 138.0 02/25/2017   CHOLHDL 4 02/25/2017                   Thyroid: He has been on Synthroid for the last few years, initially had symptoms of fatigue at diagnosis This is followed by PCP  Lab  Results  Component Value Date   TSH 3.62 02/25/2017   TSH 2.13 02/13/2016   TSH 1.82 02/09/2015   Diabetic foot exam normal in 1/18    LABS:  Lab on 06/06/2017  Component Date Value Ref Range Status  . Microalb, Ur 06/06/2017 <0.7  0.0 - 1.9 mg/dL Final  . Creatinine,U 06/06/2017 78.5  mg/dL Final  . Microalb Creat Ratio 06/06/2017 0.9  0.0 - 30.0 mg/g Final  . Hgb A1c MFr Bld 06/06/2017 8.3* 4.6 - 6.5 % Final   Glycemic Control Guidelines for People with Diabetes:Non Diabetic:  <6%Goal of Therapy: <7%Additional Action Suggested:  >8%   . Sodium 06/06/2017 137  135 - 145 mEq/L Final  . Potassium 06/06/2017 4.3  3.5 - 5.1 mEq/L Final  . Chloride 06/06/2017 102  96 - 112 mEq/L Final  . CO2 06/06/2017 29  19 - 32 mEq/L Final  . Glucose, Bld 06/06/2017 124* 70 - 99 mg/dL Final  . BUN 06/06/2017 11  6 - 23 mg/dL Final  . Creatinine, Ser 06/06/2017 1.17  0.40 - 1.50 mg/dL Final  . Total Bilirubin 06/06/2017 0.8  0.2 - 1.2 mg/dL Final  . Alkaline Phosphatase 06/06/2017 33* 39 - 117 U/L Final  . AST 06/06/2017 10  0 - 37 U/L Final  .  ALT 06/06/2017 10  0 - 53 U/L Final  . Total Protein 06/06/2017 6.9  6.0 - 8.3 g/dL Final  . Albumin 06/06/2017 4.3  3.5 - 5.2 g/dL Final  . Calcium 06/06/2017 9.4  8.4 - 10.5 mg/dL Final  . GFR 06/06/2017 66.43  >60.00 mL/min Final    Physical Examination:  BP (!) 142/84 (BP Location: Left Arm, Patient Position: Sitting, Cuff Size: Normal)   Pulse 66   Ht 5\' 6"  (1.676 m)   Wt 163 lb 3.2 oz (74 kg)   SpO2 96%   BMI 26.34 kg/m              ASSESSMENT:  Diabetes type 2, nonobese.   See history of present illness for detailed discussion of his current management, blood sugar patterns and problems identified  He has poor control because of not taking his Trulicity and also being very noncompliant with his diet regimen until recently Surprisingly even without taking Trulicity his blood sugars with very strict diet appear to be fairly desirable  including postprandial readings  This is not reflected in his A1c which is still high at 8.3  Also may be having occasional low normal or low sugars with cutting back on portions and carbohydrates However does not exercise currently   PLAN:   Continue same regimen but will increase Actos to 30 mg  Discussed that if he has low blood sugars regularly will reduce his glipizide to 5 mg  Otherwise he will make sure he has at least some carbohydrate at all meals and not skip meals  We will follow-up in 3 months again  Take Lipitor daily   There are no Patient Instructions on file for this visit.  Elayne Snare 06/10/2017, 11:32 AM   Note: This office note was prepared with Dragon voice recognition system technology. Any transcriptional errors that result from this process are unintentional.

## 2017-06-12 ENCOUNTER — Ambulatory Visit: Payer: 59 | Admitting: Endocrinology

## 2017-07-04 ENCOUNTER — Other Ambulatory Visit: Payer: Self-pay

## 2017-07-04 MED ORDER — PIOGLITAZONE HCL 15 MG PO TABS: 30.0000 mg | ORAL_TABLET | Freq: Every day | ORAL | 2 refills | Status: DC

## 2017-08-22 ENCOUNTER — Other Ambulatory Visit: Payer: Self-pay | Admitting: Internal Medicine

## 2017-09-09 ENCOUNTER — Other Ambulatory Visit: Payer: Self-pay

## 2017-09-09 ENCOUNTER — Ambulatory Visit: Payer: Self-pay | Admitting: Endocrinology

## 2017-09-12 ENCOUNTER — Ambulatory Visit: Payer: Self-pay | Admitting: Endocrinology

## 2017-11-04 ENCOUNTER — Telehealth: Payer: Self-pay | Admitting: Internal Medicine

## 2017-11-04 NOTE — Telephone Encounter (Signed)
Fleet enema or ---  Dulcolax 4 tablets and miralax 1/2 of 238 g bottle diluted in Gatorade G2 32 oz bottle to drink over a period of 6 h Thx

## 2017-11-04 NOTE — Telephone Encounter (Signed)
Patient has been battling constipation for several days.  States he has been taking mineral oil, magnesium, and Citracal.  Would like to know what he could take OTC?

## 2017-11-04 NOTE — Telephone Encounter (Signed)
Please advise 

## 2017-11-05 NOTE — Telephone Encounter (Signed)
Pt is calling to check status on something being sent to the pharmacy for his constipation. Please call pt with instructions/

## 2017-11-06 NOTE — Telephone Encounter (Signed)
Pt.notified

## 2017-11-06 NOTE — Telephone Encounter (Signed)
LMTCB

## 2017-12-02 ENCOUNTER — Ambulatory Visit (INDEPENDENT_AMBULATORY_CARE_PROVIDER_SITE_OTHER): Payer: PRIVATE HEALTH INSURANCE | Admitting: Interventional Cardiology

## 2017-12-02 ENCOUNTER — Encounter: Payer: Self-pay | Admitting: Interventional Cardiology

## 2017-12-02 VITALS — BP 140/78 | HR 64 | Ht 66.0 in | Wt 165.0 lb

## 2017-12-02 DIAGNOSIS — R072 Precordial pain: Secondary | ICD-10-CM

## 2017-12-02 DIAGNOSIS — R0602 Shortness of breath: Secondary | ICD-10-CM

## 2017-12-02 DIAGNOSIS — R5383 Other fatigue: Secondary | ICD-10-CM

## 2017-12-02 DIAGNOSIS — E119 Type 2 diabetes mellitus without complications: Secondary | ICD-10-CM

## 2017-12-02 DIAGNOSIS — E782 Mixed hyperlipidemia: Secondary | ICD-10-CM

## 2017-12-02 MED ORDER — METOPROLOL TARTRATE 100 MG PO TABS: ORAL_TABLET | ORAL | 0 refills | Status: DC

## 2017-12-02 NOTE — Progress Notes (Signed)
Cardiology Office Note   Date:  12/02/2017   ID:  Ricardo [name], DOB [birth year], MRN [id number]  PCP:  [name], Evie Lacks, MD    No chief complaint on file.  Chest pain  Wt Readings from Last 3 Encounters:  12/02/17 165 lb (74.8 kg)  06/10/17 163 lb 3.2 oz (74 kg)  02/25/17 168 lb (76.2 kg)       History of Present Illness: Ricardo [name] is a 67 y.o. male who is being seen today for the evaluation of chest pain, shortness of breath at the request of [name], Evie Lacks, MD.  Over the past 2 years, he has noted decreased exercise tolerance.  He feels that he is out of shape but wants to be sure that there is nothing wrong.  He has had DM for 10 years at least.  A1C was 8.3.  Sx are consistent with exertion.    He is concerned about his heart and is here for f/u.   Prior stress test was negative in 2009.  Cath was discussed a few years ago but he declined..     Past Medical History:  Diagnosis Date  . ARTHRITIS, HIP 08/10/2008  . BURSITIS, RIGHT SHOULDER 05/20/2008  . Degeneration of lumbar or lumbosacral intervertebral disc 11/27/2006  . DEGENERATION, CERVICAL DISC 11/27/2006  . Degenerative disc disease, lumbar   . Depression   . DEPRESSION 11/25/2006  . Diabetes mellitus   . DIABETES MELLITUS, TYPE II 04/10/2007  . Epicondylitis    bilateral  . Fatigue   . HERPES GENITALIS 09/17/2007  . Hyperlipemia   . HYPERLIPIDEMIA 03/31/2007  . Hypertension   . HYPERTENSION 11/25/2006  . HYPOTHYROIDISM 10/28/2007  . Impaired fasting glucose 12/26/2006  . Lateral epicondylitis  of elbow 05/05/2007  . Sciatica 10/28/2007  . SHOULDER PAIN, RIGHT 08/18/2008    Past Surgical History:  Procedure Laterality Date  . ACHILLES TENDON REPAIR     left  . ELBOW SURGERY  2005   left  . herniated lumbar disc  1997, 2011  . KNEE ARTHROSCOPY  2007   left  . ROTATOR CUFF REPAIR  1997   right     Current Outpatient Medications  Medication Sig Dispense Refill  . aspirin 81 MG  tablet Take 81 mg by mouth daily.      [name] atorvastatin (LIPITOR) 20 MG tablet TAKE 1 TABLET (20 MG TOTAL) BY MOUTH DAILY. 90 tablet 1  . glipiZIDE (GLUCOTROL XL) 10 MG 24 hr tablet TAKE 1 TABLET (10 MG TOTAL) BY MOUTH DAILY. 90 tablet 3  . levothyroxine (SYNTHROID, LEVOTHROID) 100 MCG tablet TAKE 1 TABLET (100 MCG TOTAL) BY MOUTH DAILY. 90 tablet 1  . losartan (COZAAR) 100 MG tablet TAKE 1 TABLET BY MOUTH ONCE DAILY 90 tablet 1  . metFORMIN (GLUCOPHAGE) 1000 MG tablet TAKE 1 TAB BY MOUTH TWICE DAILY.PATIENT NEEDS OFFICE VISIT BEFORE REFILLS WILL BE GIVEN 180 tablet 3  . pioglitazone (ACTOS) 15 MG tablet Take 2 tablets (30 mg total) by mouth daily. 180 tablet 2  . valACYclovir (VALTREX) 500 MG tablet TAKE 1 TABLET (500 MG TOTAL) BY MOUTH DAILY. 90 tablet 3   No current facility-administered medications for this visit.     Allergies:   Patient has no known allergies.    Social History:  The patient  reports that he has never smoked. He has never used smokeless tobacco. He reports that he drinks about 3.0 standard drinks of alcohol per week. He reports that he does not  use drugs.   Family History:  The patient's family history includes Diabetes in his mother and sister; Heart disease in his father and mother; Hypertension in his mother.    ROS:  Please see the history of present illness.   Otherwise, review of systems are positive for fatigue.   All other systems are reviewed and negative.    PHYSICAL EXAM: VS:  BP 140/78   Pulse 64   Ht 5\' 6"  (1.676 m)   Wt 165 lb (74.8 kg)   SpO2 97%   BMI 26.63 kg/m  , BMI Body mass index is 26.63 kg/m. GEN: Well nourished, well developed, in no acute distress  HEENT: normal  Neck: no JVD, carotid bruits, or masses Cardiac: RRR; no murmurs, rubs, or gallops,no edema  Respiratory:  clear to auscultation bilaterally, normal work of breathing GI: soft, nontender, nondistended, + BS MS: no deformity or atrophy  Skin: warm and dry, no rash Neuro:   Strength and sensation are intact Psych: euthymic mood, full affect   EKG:   The ekg ordered today demonstrates normal ECG   Recent Labs: 02/25/2017: Hemoglobin 14.3; Platelets 216.0; TSH 3.62 06/06/2017: ALT 10; BUN 11; Creatinine, Ser 1.17; Potassium 4.3; Sodium 137   Lipid Panel    Component Value Date/Time   CHOL 235 (H) 02/25/2017 0848   TRIG 138.0 02/25/2017 0848   HDL 52.50 02/25/2017 0848   CHOLHDL 4 02/25/2017 0848   VLDL 27.6 02/25/2017 0848   LDLCALC 155 (H) 02/25/2017 0848   LDLDIRECT 126.0 05/12/2014 0827     Other studies Reviewed: Additional studies/ records that were reviewed today with results demonstrating: labs reviewed.   ASSESSMENT AND PLAN:  1. Chest pain: we discussed cath, stress and CT.  SHOB band fatigue could be related to CAD. CTA coronaries was the final decision regarding testing.  He understands that a cath would be needed if there were significant abnormalities on the CT. 2. DM: A1C above target, 8.3 3. Hyperlipidemia:  LDL 155.  Continue atorvastatin with target LDL < 100.    Current medicines are reviewed at length with the patient today.  The patient concerns regarding his medicines were addressed.  The following changes have been made:  No change  Labs/ tests ordered today include:  No orders of the defined types were placed in this encounter.   Recommend 150 minutes/week of aerobic exercise Low fat, low carb, high fiber diet recommended  Disposition:   FU in after chest CT   Signed, Larae Grooms, MD  12/02/2017 4:16 PM    [facility] [place], [place], [place]  [address] Phone: [phone]; Fax: [phone]

## 2017-12-02 NOTE — Patient Instructions (Addendum)
Medication Instructions:  Your physician recommends that you continue on your current medications as directed. Please refer to the Current Medication list given to you today.  If you need a refill on your cardiac medications before your next appointment, please call your pharmacy.   Lab work: TODAY: BMET  If you have labs (blood work) drawn today and your tests are completely normal, you will receive your results only by: [name] MyChart Message (if you have MyChart) OR . A paper copy in the mail If you have any lab test that is abnormal or we need to change your treatment, we will call you to review the results.  Testing/Procedures: Your physician has requested that you have cardiac CT. Cardiac computed tomography (CT) is a painless test that uses an x-ray machine to take clear, detailed pictures of your heart. For further information please visit [email or web address]tn. Please follow instruction sheet as given.   Follow-Up: . Based on test results  Any Other Special Instructions Will Be Listed Below (If Applicable).   CARDIAC CT INSTRUCTIONS  Please arrive at the [facility] main entrance of [facility] at xx:xx AM (30-45 minutes prior to test start time)  [facility] [place], [place] [address] [phone]  Proceed to the [facility] Radiology Department (First Floor).  Please follow these instructions carefully (unless otherwise directed):  Hold all erectile dysfunction medications at least 48 hours prior to test.  On the Night Before the Test: . Be sure to Drink plenty of water. . Do not consume any caffeinated/decaffeinated beverages or chocolate 12 hours prior to your test. . Do not take any antihistamines 12 hours prior to your test. . If you take Metformin do not take 24 hours prior to test.   On the Day of the Test: . Drink plenty of water. Do not drink any water within one hour of the test. . Do not eat any food 4 hours prior to the  test. . You may take your regular medications prior to the test.  . Take metoprolol (Lopressor) two hours prior to test. . DO NOT TAKE metformin the morning of the test . DO NOT TAKE actos the morning of the test . DO NOT TAKE glipizide the morning of test       After the Test: . Drink plenty of water. . After receiving IV contrast, you may experience a mild flushed feeling. This is normal. . On occasion, you may experience a mild rash up to 24 hours after the test. This is not dangerous. If this occurs, you can take Benadryl 25 mg and increase your fluid intake. . If you experience trouble breathing, this can be serious. If it is severe call 911 IMMEDIATELY. If it is mild, please call our office. . If you take any of these medications: Glipizide/Metformin, Avandament, Glucavance, please do not take 48 hours after completing test.

## 2017-12-03 LAB — BASIC METABOLIC PANEL
BUN / CREAT RATIO: 16 (ref 10–24)
BUN: 18 mg/dL (ref 8–27)
CALCIUM: 9.6 mg/dL (ref 8.6–10.2)
CHLORIDE: 99 mmol/L (ref 96–106)
CO2: 25 mmol/L (ref 20–29)
Creatinine, Ser: 1.14 mg/dL (ref 0.76–1.27)
GFR, EST AFRICAN AMERICAN: 78 mL/min/{1.73_m2} (ref >59)
GFR, EST NON AFRICAN AMERICAN: 67 mL/min/{1.73_m2} (ref >59)
Glucose: 115 mg/dL — ABNORMAL HIGH (ref 65–99)
Potassium: 4.2 mmol/L (ref 3.5–5.2)
Sodium: 140 mmol/L (ref 134–144)

## 2017-12-06 ENCOUNTER — Ambulatory Visit: Payer: Self-pay

## 2017-12-06 ENCOUNTER — Encounter: Payer: Self-pay | Admitting: Sports Medicine

## 2017-12-06 ENCOUNTER — Ambulatory Visit: Payer: PRIVATE HEALTH INSURANCE | Admitting: Sports Medicine

## 2017-12-06 VITALS — BP 154/92 | HR 66 | Ht 66.0 in | Wt 164.6 lb

## 2017-12-06 DIAGNOSIS — M25561 Pain in right knee: Secondary | ICD-10-CM | POA: Diagnosis not present

## 2017-12-06 NOTE — Patient Instructions (Signed)

## 2017-12-06 NOTE — Procedures (Signed)
PROCEDURE NOTE:  Ultrasound Guided: Injection: Right knee Images were obtained and interpreted by myself, Teresa Coombs, DO  Images have been saved and stored to PACS system. Images obtained on: GE S7 Ultrasound machine    ULTRASOUND FINDINGS:  Slight degenerative bulging of the medial and lateral meniscus.  No effusion.  Mild synovitis.  DESCRIPTION OF PROCEDURE:  The patient's clinical condition is marked by substantial pain and/or significant functional disability. Other conservative therapy has not provided relief, is contraindicated, or not appropriate. There is a reasonable likelihood that injection will significantly improve the patient's pain and/or functional impairment.   After discussing the risks, benefits and expected outcomes of the injection and all questions were reviewed and answered, the patient wished to undergo the above named procedure.  Verbal consent was obtained.  The ultrasound was used to identify the target structure and adjacent neurovascular structures. The skin was then prepped in sterile fashion and the target structure was injected under direct visualization using sterile technique as below:  Single injection performed as below: PREP: Alcohol and Ethel Chloride APPROACH:superiolateral, single injection, 25g 1.5 in. INJECTATE: 2 cc 0.5% Marcaine and 2 cc 40mg /mL DepoMedrol ASPIRATE: None DRESSING: Band-Aid  Post procedural instructions including recommending icing and warning signs for infection were reviewed.    This procedure was well tolerated and there were no complications.   IMPRESSION: Succesful Ultrasound Guided: Injection

## 2017-12-06 NOTE — Progress Notes (Signed)
Ricardo [name]. [place], [name]  Ricardo [name] - 67 y.o. male MRN [id number]  Date of birth: 1950/08/13  Visit Date: 12/06/2017  PCP: [name], MD   Referred by: [name], MD   Scribe(s) for today's visit: [name], LAT, ATC  SUBJECTIVE:  Ricardo [name] is here for Initial Assessment (R knee pain)    HPI: His R knee symptoms INITIALLY: Began a few years ago as intermittent but about a month ago his pain has intensified.  He's been remodeling his house so he's been working on his knees a lot recently.  He's been to see Dr. [name] in April 2018 for his L knee and was given a HEP. Described as moderate-severe sharp and aching/throbbing pain, nonradiating.  He explains that he feels tightness in his knee and feels "catching" internally in his R knee. Worsened with kneeling, squatting, walking Improved with rest Additional associated symptoms include: no N/T in R LE, no R knee swelling and no true mechanical symptoms noted in the R knee    At this time symptoms are worsening compared to onset  He has not been doing anything in particular to treat his R knee.  Prior hx of a L knee menisectomy and L knee XR and MRI. No R knee imaging  REVIEW OF SYSTEMS: Denies night time disturbances. Denies fevers, chills, or night sweats. Denies unexplained weight loss. Denies personal history of cancer. Denies changes in bowel or bladder habits. Denies recent unreported falls. Reports new or worsening dyspnea or wheezing.  He's seeing a specialist for his SOB. Reports headaches or dizziness. Yes to dizziness Reports numbness, tingling or weakness  In the extremities - in R foot due to prior lumbar sx Reports dizziness or presyncopal episodes Denies lower extremity edema    HISTORY:  Prior history reviewed and updated per electronic medical record.  Social History   Occupational History  .  Not on file  Tobacco Use  . Smoking status: Never Smoker  . Smokeless tobacco: Never Used  Substance and Sexual Activity  . Alcohol use: Yes    Alcohol/week: 3.0 standard drinks    Types: 3 Cans of beer per week  . Drug use: No  . Sexual activity: Yes   Social History   Social History Narrative  . Not on file    Past Medical History:  Diagnosis Date  . ARTHRITIS, HIP 08/10/2008  . BURSITIS, RIGHT SHOULDER 05/20/2008  . Degeneration of lumbar or lumbosacral intervertebral disc 11/27/2006  . DEGENERATION, CERVICAL DISC 11/27/2006  . Degenerative disc disease, lumbar   . Depression   . DEPRESSION 11/25/2006  . Diabetes mellitus   . DIABETES MELLITUS, TYPE II 04/10/2007  . Epicondylitis    bilateral  . Fatigue   . HERPES GENITALIS 09/17/2007  . Hyperlipemia   . HYPERLIPIDEMIA 03/31/2007  . Hypertension   . HYPERTENSION 11/25/2006  . HYPOTHYROIDISM 10/28/2007  . Impaired fasting glucose 12/26/2006  . Lateral epicondylitis  of elbow 05/05/2007  . Sciatica 10/28/2007  . SHOULDER PAIN, RIGHT 08/18/2008   Past Surgical History:  Procedure Laterality Date  . ACHILLES TENDON REPAIR     left  . ELBOW SURGERY  2005   left  . herniated lumbar disc  1997, 2011  . KNEE ARTHROSCOPY  2007   left  . [place]   right   family history includes Diabetes in his mother and sister; Heart disease in  his father and mother; Hypertension in his mother. There is no history of Colon cancer.  DATA OBTAINED & REVIEWED:   Recent Labs    02/25/17 0859 06/06/17 0837  HGBA1C 8.4* 8.3*   .   OBJECTIVE:  VS:  HT:5\' 6"  (167.6 cm)   WT:164 lb 9.6 oz (74.7 kg)  BMI:26.58    BP:(!) 154/92  HR:66bpm  TEMP: ( )  RESP:96 %   PHYSICAL EXAM: CONSTITUTIONAL: Well-developed, Well-nourished and In no acute distress PSYCHIATRIC: Alert & appropriately interactive. and Not depressed or anxious appearing. RESPIRATORY: No increased work of breathing and Trachea Midline EYES: Pupils are equal.,  EOM intact without nystagmus. and No scleral icterus.  VASCULAR EXAM: Warm and well perfused NEURO: unremarkable Normal associated myotomal distribution strength to manual muscle testing Normal sensation to light touch Normal and symmetric associated DTRs  MSK Exam: Right knee  Well aligned, no significant deformity. No overlying skin changes. Small effusion.  Mild generalized synovitis.   RANGE OF MOTION & STRENGTH  Full flexion and extension of the knee with only minimal pain with terminal flexion.   SPECIALITY TESTING:  Ligamentously stable.  Mild pain with McMurray's and Thessaly but no reproducible mechanical symptoms.     ASSESSMENT   1. Acute pain of right knee     PLAN:  Pertinent additional documentation may be included in corresponding procedure notes, imaging studies, problem based documentation and patient instructions.  Procedures:  . US Guided Injection per procedure note  Medications:  No orders of the defined types were placed in this encounter.  Discussion/Instructions: No problem-specific Assessment & Plan notes found for this encounter.  . Injection performed today.  Underlying mild to moderate degenerative changes with degenerative meniscal tear . Discussed red flag symptoms that warrant earlier emergent evaluation and patient voices understanding. . Activity modifications and the importance of avoiding exacerbating activities (limiting pain to no more than a 4 / 10 during or following activity) recommended and discussed.  Follow-up:  . Return if symptoms worsen or fail to improve.   . If any lack of improvement consider: further diagnostic evaluation with Plain film x-rays and/or MRI of the knee.  . At follow up will plan to consider: repeat corticosteroid injections     CMA/ATC served as scribe during this visit. History, Physical, and Plan performed by medical provider. Documentation and orders reviewed and attested to.      [name], [facility] Physician

## 2018-01-12 ENCOUNTER — Other Ambulatory Visit: Payer: Self-pay | Admitting: Internal Medicine

## 2018-02-04 ENCOUNTER — Ambulatory Visit (INDEPENDENT_AMBULATORY_CARE_PROVIDER_SITE_OTHER): Payer: PRIVATE HEALTH INSURANCE | Admitting: Family Medicine

## 2018-02-04 ENCOUNTER — Encounter: Payer: Self-pay | Admitting: Family Medicine

## 2018-02-04 VITALS — BP 138/78 | HR 65 | Temp 97.5°F | Ht 66.0 in | Wt 163.2 lb

## 2018-02-04 DIAGNOSIS — J40 Bronchitis, not specified as acute or chronic: Secondary | ICD-10-CM

## 2018-02-04 MED ORDER — AZITHROMYCIN 250 MG PO TABS: ORAL_TABLET | ORAL | 0 refills | Status: DC

## 2018-02-04 MED ORDER — IPRATROPIUM BROMIDE 0.06 % NA SOLN: 2.0000 | Freq: Four times a day (QID) | NASAL | 0 refills | Status: DC

## 2018-02-04 NOTE — Progress Notes (Signed)
   Subjective:  Ricardo [name] is a 67 y.o. male who presents today for same-day appointment with a chief complaint of cough.   HPI:  Cough, Acute problem Started 5 days ago. Worsened over that time. Associated with watery eyes, sore throat, and myalgia.  No fevers.  No chills.  Cough is worse with deep inspiration.  Occasional wheeze.  Tried NyQuil which is helped a little bit.  No sick contacts.  No other obvious alleviating or aggravating factors.  ROS: Per HPI  PMH: He reports that he has never smoked. He has never used smokeless tobacco. He reports that he drinks about 3.0 standard drinks of alcohol per week. He reports that he does not use drugs.  Objective:  Physical Exam: BP 138/78 (BP Location: Left Arm, Patient Position: Sitting, Cuff Size: Normal)   Pulse 65   Temp (!) 97.5 F (36.4 C) (Oral)   Ht 5\' 6"  (1.676 m)   Wt 163 lb 4 oz (74 kg)   SpO2 97%   BMI 26.35 kg/m   Gen: NAD, resting comfortably HEENT: TMs with clear effusion.  OP erythematous. CV: RRR with no murmurs appreciated Pulm: NWOB, scattered expiratory wheeze and bibasilar rhonchi.  Assessment/Plan:  Bronchitis Given pulmonary exam will start course of azithromycin.  Also start Atrovent nasal spray for his rhinorrhea and postnasal drip.  History not consistent with flu-no indication for testing today, and additionally he is out of the window for treatment.  Recommended good oral hydration.  Recommended Tylenol/or Motrin as needed.  Discussed reasons return to care.  Follow-up as needed.  Algis Greenhouse. Jerline Pain, MD 02/04/2018 4:51 PM

## 2018-02-04 NOTE — Patient Instructions (Signed)
You have bronchitis. I do not think you have flu.  Start the atrovent and the zpack.   Please stay well hydrated.  You can take tylenol and/or motrin as needed for low grade fever and pain.  Please let me know if your symptoms worsen or fail to improve.  Take care, Dr Jerline Pain

## 2018-02-05 ENCOUNTER — Other Ambulatory Visit: Payer: Self-pay

## 2018-02-05 ENCOUNTER — Other Ambulatory Visit: Payer: PRIVATE HEALTH INSURANCE | Admitting: *Deleted

## 2018-02-05 ENCOUNTER — Other Ambulatory Visit: Payer: Self-pay | Admitting: Interventional Cardiology

## 2018-02-05 DIAGNOSIS — Z01812 Encounter for preprocedural laboratory examination: Secondary | ICD-10-CM

## 2018-02-05 DIAGNOSIS — R072 Precordial pain: Secondary | ICD-10-CM

## 2018-02-06 ENCOUNTER — Other Ambulatory Visit: Payer: Self-pay | Admitting: Internal Medicine

## 2018-02-06 LAB — BASIC METABOLIC PANEL
BUN/Creatinine Ratio: 11 (ref 10–24)
BUN: 13 mg/dL (ref 8–27)
CALCIUM: 9.4 mg/dL (ref 8.6–10.2)
CO2: 25 mmol/L (ref 20–29)
CREATININE: 1.18 mg/dL (ref 0.76–1.27)
Chloride: 102 mmol/L (ref 96–106)
GFR calc Af Amer: 74 mL/min/{1.73_m2} (ref >59)
GFR calc non Af Amer: 64 mL/min/{1.73_m2} (ref >59)
Glucose: 306 mg/dL — ABNORMAL HIGH (ref 65–99)
Potassium: 4.7 mmol/L (ref 3.5–5.2)
SODIUM: 145 mmol/L — AB (ref 134–144)

## 2018-02-07 ENCOUNTER — Other Ambulatory Visit: Payer: Self-pay | Admitting: Interventional Cardiology

## 2018-02-10 ENCOUNTER — Ambulatory Visit: Payer: Self-pay | Admitting: *Deleted

## 2018-02-10 NOTE — Telephone Encounter (Signed)
Message from Jodie Echevaria sent at 02/10/2018 3:59 PM EST   Patient called to say that he was Dxed with Bronchitis on 02/04/2018 by Dr Jerline Pain, he states that the cough is getting worst and need something called in to the pharmacy please. States that the cough is interfering with his breathing.    [id number] [place] [place] [address] [phone] (Phone) [phone] (Fax)   Pt returned call to office. Pt requesting prescription to help with cough. Pt states he feels like the cough is getting worse and makes it hard to catch his breath after having a coughing spell. Pt states he has minimal phlegm with coughing but it is enough to be irritating.Pt states that he has completed course of Azithromycin. Pt offered f/u appt but pt states he was told to call the office if symptoms remained. [facility] [address]

## 2018-02-11 MED ORDER — DOXYCYCLINE HYCLATE 100 MG PO TABS: 100.0000 mg | ORAL_TABLET | Freq: Two times a day (BID) | ORAL | 0 refills | Status: DC

## 2018-02-11 MED ORDER — BENZONATATE 200 MG PO CAPS: 200.0000 mg | ORAL_CAPSULE | Freq: Two times a day (BID) | ORAL | 0 refills | Status: DC | PRN

## 2018-02-11 NOTE — Telephone Encounter (Signed)
Patient notified and voices understanding 

## 2018-02-11 NOTE — Telephone Encounter (Signed)
See note

## 2018-02-11 NOTE — Telephone Encounter (Signed)
Please advise 

## 2018-02-17 ENCOUNTER — Telehealth (HOSPITAL_COMMUNITY): Payer: Self-pay | Admitting: Emergency Medicine

## 2018-02-17 NOTE — Telephone Encounter (Signed)
Reaching out to patient to offer assistance regarding upcoming cardiac imaging study; pt not available, left message on voicemail with name and call back number provided for further questions should they arise Marchia Bond RN Navigator Cardiac Imaging [phone]

## 2018-02-18 ENCOUNTER — Ambulatory Visit (HOSPITAL_COMMUNITY): Admission: RE | Admit: 2018-02-18 | Payer: 59 | Source: Ambulatory Visit

## 2018-02-18 ENCOUNTER — Ambulatory Visit (HOSPITAL_COMMUNITY)
Admission: RE | Admit: 2018-02-18 | Discharge: 2018-02-18 | Disposition: A | Discharge status: Home / Self Care | Payer: 59 | Source: Ambulatory Visit | Attending: Interventional Cardiology | Admitting: Interventional Cardiology

## 2018-02-18 DIAGNOSIS — R5383 Other fatigue: Secondary | ICD-10-CM | POA: Diagnosis present

## 2018-02-18 DIAGNOSIS — R079 Chest pain, unspecified: Secondary | ICD-10-CM | POA: Diagnosis not present

## 2018-02-18 DIAGNOSIS — R0602 Shortness of breath: Secondary | ICD-10-CM

## 2018-02-18 DIAGNOSIS — R072 Precordial pain: Secondary | ICD-10-CM | POA: Insufficient documentation

## 2018-02-18 MED ORDER — NITROGLYCERIN 0.4 MG SL SUBL
0.8000 mg | SUBLINGUAL_TABLET | SUBLINGUAL | Status: DC | PRN
  Administered 2018-02-18: 0.8 mg via SUBLINGUAL
  Filled 2018-02-18: qty 25

## 2018-02-18 MED ORDER — NITROGLYCERIN 0.4 MG SL SUBL
SUBLINGUAL_TABLET | SUBLINGUAL | Status: AC
  Filled 2018-02-18: qty 2

## 2018-02-18 MED ORDER — IOPAMIDOL (ISOVUE-370) INJECTION 76%
100.0000 mL | Freq: Once | INTRAVENOUS | Status: AC | PRN
  Administered 2018-02-18: 100 mL via INTRAVENOUS

## 2018-02-20 ENCOUNTER — Telehealth: Payer: Self-pay | Admitting: Interventional Cardiology

## 2018-02-20 NOTE — Telephone Encounter (Signed)
Informed pt of results. Pt verbalized understanding. 

## 2018-02-20 NOTE — Telephone Encounter (Signed)
Left message to call back It pt returns call, please route to triage.

## 2018-02-20 NOTE — Telephone Encounter (Signed)
New Message    Patient returning your call and asked for you to call them back

## 2018-02-27 ENCOUNTER — Other Ambulatory Visit (INDEPENDENT_AMBULATORY_CARE_PROVIDER_SITE_OTHER): Payer: PRIVATE HEALTH INSURANCE

## 2018-02-27 ENCOUNTER — Ambulatory Visit (INDEPENDENT_AMBULATORY_CARE_PROVIDER_SITE_OTHER): Payer: PRIVATE HEALTH INSURANCE | Admitting: Internal Medicine

## 2018-02-27 ENCOUNTER — Encounter: Payer: Self-pay | Admitting: Internal Medicine

## 2018-02-27 VITALS — BP 140/70 | HR 70 | Ht 66.0 in | Wt 162.0 lb

## 2018-02-27 DIAGNOSIS — E034 Atrophy of thyroid (acquired): Secondary | ICD-10-CM

## 2018-02-27 DIAGNOSIS — I10 Essential (primary) hypertension: Secondary | ICD-10-CM | POA: Diagnosis not present

## 2018-02-27 DIAGNOSIS — E119 Type 2 diabetes mellitus without complications: Secondary | ICD-10-CM

## 2018-02-27 DIAGNOSIS — J069 Acute upper respiratory infection, unspecified: Secondary | ICD-10-CM

## 2018-02-27 DIAGNOSIS — N4282 Prostatosis syndrome: Secondary | ICD-10-CM

## 2018-02-27 DIAGNOSIS — Z0000 Encounter for general adult medical examination without abnormal findings: Secondary | ICD-10-CM

## 2018-02-27 DIAGNOSIS — J8410 Pulmonary fibrosis, unspecified: Secondary | ICD-10-CM | POA: Diagnosis not present

## 2018-02-27 LAB — CBC WITH DIFFERENTIAL/PLATELET
Basophils Absolute: 0.1 10*3/uL (ref 0.0–0.1)
Basophils Relative: 1.3 % (ref 0.0–3.0)
Eosinophils Absolute: 0.4 10*3/uL (ref 0.0–0.7)
Eosinophils Relative: 5 % (ref 0.0–5.0)
HCT: 43.7 % (ref 39.0–52.0)
Hemoglobin: 14.6 g/dL (ref 13.0–17.0)
Lymphocytes Relative: 19.2 % (ref 12.0–46.0)
Lymphs Abs: 1.4 10*3/uL (ref 0.7–4.0)
MCHC: 33.5 g/dL (ref 30.0–36.0)
MCV: 89.3 fl (ref 78.0–100.0)
Monocytes Absolute: 0.7 10*3/uL (ref 0.1–1.0)
Monocytes Relative: 9.3 % (ref 3.0–12.0)
Neutro Abs: 4.9 10*3/uL (ref 1.4–7.7)
Neutrophils Relative %: 65.2 % (ref 43.0–77.0)
Platelets: 215 10*3/uL (ref 150.0–400.0)
RBC: 4.9 Mil/uL (ref 4.22–5.81)
RDW: 13.3 % (ref 11.5–15.5)
WBC: 7.4 10*3/uL (ref 4.0–10.5)

## 2018-02-27 LAB — MICROALBUMIN / CREATININE URINE RATIO
Creatinine,U: 109.8 mg/dL
Microalb Creat Ratio: 0.6 mg/g (ref 0.0–30.0)
Microalb, Ur: <0.7 mg/dL (ref 0.0–1.9)

## 2018-02-27 LAB — LIPID PANEL
Cholesterol: 282 mg/dL — ABNORMAL HIGH (ref 0–200)
HDL: 61.9 mg/dL (ref >39.00)
LDL Cholesterol: 183 mg/dL — ABNORMAL HIGH (ref 0–99)
NonHDL: 220.05
Total CHOL/HDL Ratio: 5
Triglycerides: 186 mg/dL — ABNORMAL HIGH (ref 0.0–149.0)
VLDL: 37.2 mg/dL (ref 0.0–40.0)

## 2018-02-27 LAB — URINALYSIS
Bilirubin Urine: NEGATIVE
Hgb urine dipstick: NEGATIVE
Ketones, ur: NEGATIVE
Leukocytes, UA: NEGATIVE
Nitrite: NEGATIVE
Specific Gravity, Urine: 1.01 (ref 1.000–1.030)
TOTAL PROTEIN, URINE-UPE24: NEGATIVE
Urine Glucose: 100 — AB
Urobilinogen, UA: 0.2 (ref 0.0–1.0)
pH: 6.5 (ref 5.0–8.0)

## 2018-02-27 LAB — BASIC METABOLIC PANEL
BUN: 12 mg/dL (ref 6–23)
CO2: 32 mEq/L (ref 19–32)
Calcium: 9.7 mg/dL (ref 8.4–10.5)
Chloride: 97 mEq/L (ref 96–112)
Creatinine, Ser: 1.19 mg/dL (ref 0.40–1.50)
GFR: 65 mL/min (ref >60.00)
Glucose, Bld: 223 mg/dL — ABNORMAL HIGH (ref 70–99)
POTASSIUM: 4.5 meq/L (ref 3.5–5.1)
Sodium: 137 mEq/L (ref 135–145)

## 2018-02-27 LAB — TSH: TSH: 3.72 u[IU]/mL (ref 0.35–4.50)

## 2018-02-27 LAB — HEPATIC FUNCTION PANEL
ALT: 18 U/L (ref 0–53)
AST: 13 U/L (ref 0–37)
Albumin: 4.3 g/dL (ref 3.5–5.2)
Alkaline Phosphatase: 43 U/L (ref 39–117)
BILIRUBIN DIRECT: 0.1 mg/dL (ref 0.0–0.3)
Total Bilirubin: 0.7 mg/dL (ref 0.2–1.2)
Total Protein: 6.8 g/dL (ref 6.0–8.3)

## 2018-02-27 LAB — HEMOGLOBIN A1C: Hgb A1c MFr Bld: 9.1 % — ABNORMAL HIGH (ref 4.6–6.5)

## 2018-02-27 LAB — PSA: PSA: 4.76 ng/mL — ABNORMAL HIGH (ref 0.10–4.00)

## 2018-02-27 MED ORDER — AZITHROMYCIN 250 MG PO TABS: ORAL_TABLET | ORAL | 0 refills | Status: DC

## 2018-02-27 NOTE — Assessment & Plan Note (Signed)
Zpac 

## 2018-02-27 NOTE — Assessment & Plan Note (Signed)
12/19 CT IMPRESSION: Peripheral interstitial thickening and ground-glass opacities, likely reflect fibrosis/scarring.  Pulm ref

## 2018-02-27 NOTE — Progress Notes (Signed)
Subjective:  Patient ID: Ricardo [name], male    DOB: 1950/11/29  Age: 68 y.o. MRN: [id number]  CC: Annual Exam   HPI Ricardo [name] presents for a well exam Not taking Losartan x 4 mo for ?reason. BP - nl at home C/o OAB - Myrbetriq helped some in the past  Outpatient Medications Prior to Visit  Medication Sig Dispense Refill  . aspirin 81 MG tablet Take 81 mg by mouth daily.      . benzonatate (TESSALON) 200 MG capsule Take 1 capsule (200 mg total) by mouth 2 (two) times daily as needed for cough. 20 capsule 0  . glipiZIDE (GLUCOTROL XL) 10 MG 24 hr tablet TAKE 1 TABLET (10 MG TOTAL) BY MOUTH DAILY. 90 tablet 3  . ipratropium (ATROVENT) 0.06 % nasal spray Place 2 sprays into both nostrils 4 (four) times daily. 15 mL 0  . levothyroxine (SYNTHROID, LEVOTHROID) 100 MCG tablet Take 1 tablet (100 mcg total) by mouth daily. Must keep appt 02/27/18 to get refills 30 tablet 0  . metFORMIN (GLUCOPHAGE) 1000 MG tablet TAKE 1 TAB BY MOUTH TWICE DAILY.PATIENT NEEDS OFFICE VISIT BEFORE REFILLS WILL BE GIVEN 180 tablet 3  . metoprolol tartrate (LOPRESSOR) 100 MG tablet Take 1 tablet 2 hours prior to Cardiac CT 1 tablet 0  . pioglitazone (ACTOS) 15 MG tablet Take 2 tablets (30 mg total) by mouth daily. 180 tablet 2  . valACYclovir (VALTREX) 500 MG tablet TAKE 1 TABLET (500 MG TOTAL) BY MOUTH DAILY. 90 tablet 3  . atorvastatin (LIPITOR) 20 MG tablet TAKE 1 TABLET (20 MG TOTAL) BY MOUTH DAILY. (Patient not taking: Reported on 02/27/2018) 90 tablet 1  . losartan (COZAAR) 100 MG tablet TAKE 1 TABLET BY MOUTH ONCE DAILY (Patient not taking: Reported on 02/27/2018) 90 tablet 1  . azithromycin (ZITHROMAX) 250 MG tablet Take 2 tabs day 1, then 1 tab daily (Patient not taking: Reported on 02/27/2018) 6 each 0  . doxycycline (VIBRA-TABS) 100 MG tablet Take 1 tablet (100 mg total) by mouth 2 (two) times daily. (Patient not taking: Reported on 02/27/2018) 14 tablet 0   No facility-administered medications prior  to visit.     ROS: Review of Systems  Constitutional: Negative for appetite change, fatigue and unexpected weight change.  HENT: Negative for congestion, nosebleeds, sneezing, sore throat and trouble swallowing.   Eyes: Negative for itching and visual disturbance.  Respiratory: Negative for cough.   Cardiovascular: Negative for chest pain, palpitations and leg swelling.  Gastrointestinal: Negative for abdominal distention, blood in stool, diarrhea and nausea.  Genitourinary: Positive for frequency and urgency. Negative for hematuria.  Musculoskeletal: Negative for back pain, gait problem, joint swelling and neck pain.  Skin: Negative for rash.  Neurological: Negative for dizziness, tremors, speech difficulty and weakness.  Psychiatric/Behavioral: Negative for agitation, dysphoric mood, sleep disturbance and suicidal ideas. The patient is nervous/anxious.     Objective:  BP 140/70 (BP Location: Left Arm, Patient Position: Sitting, Cuff Size: Normal)   Pulse 70   Ht 5\' 6"  (1.676 m)   Wt 162 lb (73.5 kg)   SpO2 96%   BMI 26.15 kg/m   BP Readings from Last 3 Encounters:  02/27/18 140/70  02/18/18 104/64  02/04/18 138/78    Wt Readings from Last 3 Encounters:  02/27/18 162 lb (73.5 kg)  02/04/18 163 lb 4 oz (74 kg)  12/06/17 164 lb 9.6 oz (74.7 kg)    Physical Exam Constitutional:      General:  He is not in acute distress.    Appearance: He is well-developed.     Comments: NAD  Eyes:     Conjunctiva/sclera: Conjunctivae normal.     Pupils: Pupils are equal, round, and reactive to light.  Neck:     Musculoskeletal: Normal range of motion.     Thyroid: No thyromegaly.     Vascular: No JVD.  Cardiovascular:     Rate and Rhythm: Normal rate and regular rhythm.     Heart sounds: Normal heart sounds. No murmur. No friction rub. No gallop.   Pulmonary:     Effort: Pulmonary effort is normal. No respiratory distress.     Breath sounds: Normal breath sounds. No wheezing or  rales.  Chest:     Chest wall: No tenderness.  Abdominal:     General: Bowel sounds are normal. There is no distension.     Palpations: Abdomen is soft. There is no mass.     Tenderness: There is no abdominal tenderness. There is no guarding or rebound.  Musculoskeletal: Normal range of motion.        General: No tenderness.  Lymphadenopathy:     Cervical: No cervical adenopathy.  Skin:    General: Skin is warm and dry.     Findings: No rash.  Neurological:     Mental Status: He is alert and oriented to person, place, and time.     Cranial Nerves: No cranial nerve deficit.     Motor: No abnormal muscle tone.     Coordination: Coordination normal.     Gait: Gait normal.     Deep Tendon Reflexes: Reflexes are normal and symmetric.  Psychiatric:        Behavior: Behavior normal.        Thought Content: Thought content normal.        Judgment: Judgment normal.   pt declined rectal exam  Wax L ear  Lab Results  Component Value Date   WBC 6.4 02/25/2017   HGB 14.3 02/25/2017   HCT 43.1 02/25/2017   PLT 216.0 02/25/2017   GLUCOSE 306 (H) 02/05/2018   CHOL 235 (H) 02/25/2017   TRIG 138.0 02/25/2017   HDL 52.50 02/25/2017   LDLDIRECT 126.0 05/12/2014   LDLCALC 155 (H) 02/25/2017   ALT 10 06/06/2017   AST 10 06/06/2017   NA 145 (H) 02/05/2018   K 4.7 02/05/2018   CL 102 02/05/2018   CREATININE 1.18 02/05/2018   BUN 13 02/05/2018   CO2 25 02/05/2018   TSH 3.62 02/25/2017   PSA 4.81 (H) 02/13/2016   INR 1.0 04/02/2007   HGBA1C 8.3 (H) 06/06/2017   MICROALBUR <0.7 06/06/2017    Ct Coronary Morph W/cta Cor W/score W/ca W/cm &/or Wo/cm  Addendum Date: 02/18/2018   ADDENDUM REPORT: 02/18/2018 11:48 CLINICAL DATA:  52M with hypertension, hyperlipidemia, diabetes, shortness of breath and chest pain. EXAM: Cardiac/Coronary  CT TECHNIQUE: The patient was scanned on a [employer]. FINDINGS: A 120 kV prospective scan was triggered in the descending thoracic aorta at  111 HU's. Axial non-contrast 3 mm slices were carried out through the heart. The data set was analyzed on a dedicated work station and scored using the [place]. Gantry rotation speed was 250 msecs and collimation was .6 mm. No beta blockade and 0.8 mg of sl NTG was given. The 3D data set was reconstructed in 5% intervals of the 67-82 % of the R-R cycle. Diastolic phases were analyzed on a dedicated work station  using MPR, MIP and VRT modes. The patient received 80 cc of contrast. Aorta: Normal size. Ascending aorta 3.7 cm. No calcifications. No dissection. Aortic Valve:  Trileaflet.  No calcifications. Coronary Arteries:  Normal coronary origin.  Right dominance. RCA is a large dominant artery that gives rise to PDA and PLVB. There is trivial calcified plaque proximally. Left main is a large artery that gives rise to LAD and LCX arteries. The LAD has minimal calcified plaque proximally. Diagonal vessels were small. LCX is a non-dominant artery that gives rise to one large OM1 branch. There is no plaque. Other findings: Normal pulmonary vein drainage into the left atrium. Normal let atrial appendage without a thrombus. Normal size of the pulmonary artery. IMPRESSION: 1. Coronary calcium score of 17.3. This was 30th percentile for age and sex matched control. 2. Normal coronary origin with right dominance. 3. Minimal calcified plaque in the proximal LAD and RCA. Skeet Latch, MD Electronically Signed   By: Skeet Latch   On: 02/18/2018 11:48   Result Date: 02/18/2018 EXAM: OVER-READ INTERPRETATION  CT CHEST The following report is an over-read performed by radiologist Dr. Rolm Baptise of [facility] on 02/18/2018. This over-read does not include interpretation of cardiac or coronary anatomy or pathology. The coronary CTA interpretation by the cardiologist is attached. COMPARISON:  04/01/2007 FINDINGS: Vascular: Heart is upper limits normal in size. Visualized aorta is normal caliber.  Mediastinum/Nodes: No adenopathy in the lower mediastinum or hila. Lungs/Pleura: Peripheral ground-glass opacities and interstitial prominence likely scarring/fibrosis. Platelike scarring at the left lung base. No effusions. Upper Abdomen: Imaging into the upper abdomen shows no acute findings. Musculoskeletal: Chest wall soft tissues are unremarkable. No acute bony abnormality. IMPRESSION: Peripheral interstitial thickening and ground-glass opacities, likely reflect fibrosis/scarring. No acute extra cardiac abnormality. Electronically Signed: By: Rolm Baptise M.D. On: 02/18/2018 09:18    Assessment & Plan:   There are no diagnoses linked to this encounter.   No orders of the defined types were placed in this encounter.    Follow-up: No follow-ups on file.  Walker Kehr, MD

## 2018-02-27 NOTE — Assessment & Plan Note (Signed)
BP Readings from Last 3 Encounters:  02/27/18 140/70  02/18/18 104/64  02/04/18 138/78  Not taking Losartan x 4 mo for ?reason. BP - nl at home Labs

## 2018-02-27 NOTE — Assessment & Plan Note (Signed)
Meds reviewed Labs

## 2018-02-27 NOTE — Assessment & Plan Note (Signed)
TSH 

## 2018-02-27 NOTE — Assessment & Plan Note (Addendum)
We discussed age appropriate health related issues, including available/recomended screening tests and vaccinations. We discussed a need for adhering to healthy diet and exercise. Labs/EKG were reviewed/ordered. All questions were answered. CT ca chest/heart 12/19 score 17 Zostavax suggested Colon is due now - the pt will schedule Refused shots 2020

## 2018-03-06 ENCOUNTER — Other Ambulatory Visit: Payer: Self-pay | Admitting: Internal Medicine

## 2018-03-06 DIAGNOSIS — R972 Elevated prostate specific antigen [PSA]: Secondary | ICD-10-CM | POA: Insufficient documentation

## 2018-03-06 DIAGNOSIS — N401 Enlarged prostate with lower urinary tract symptoms: Secondary | ICD-10-CM | POA: Insufficient documentation

## 2018-03-12 ENCOUNTER — Telehealth: Payer: Self-pay | Admitting: Internal Medicine

## 2018-03-12 NOTE — Telephone Encounter (Signed)
Copied from [place] [id number]. Topic: General - Inquiry >> Mar 12, 2018  8:08 AM Margot Ables wrote: Reason for CRM: pt notes that he is still having some cough and congestion since OV 02/27/2018. He is asking for another round of ABX. Please advise.  [id number] [place] [address]. [address] [phone] (Phone) [phone] (Fax)

## 2018-03-12 NOTE — Telephone Encounter (Signed)
Routing to dr plotnikov, can you send in more abx?  Please advise, I will call patient back, thanks

## 2018-03-13 MED ORDER — AZITHROMYCIN 250 MG PO TABS: ORAL_TABLET | ORAL | 0 refills | Status: DC

## 2018-03-13 NOTE — Telephone Encounter (Signed)
Pt states he needs something for the cough as the congestion is causing him to cough. Patient is aware another zpack was sent in and if symptoms worsen , will need OV

## 2018-03-13 NOTE — Telephone Encounter (Signed)
I emailed a Z-Pak renewal.  Office visit if not better.  Thank you

## 2018-03-13 NOTE — Telephone Encounter (Signed)
Patient will start abx----but states coughing is still really bad---routing to dr plotnikov, can you send something in for cough----please advise, I will call patient back, thanks

## 2018-03-13 NOTE — Telephone Encounter (Signed)
Already routed to dr plotnikov this am

## 2018-03-18 MED ORDER — HYDROCODONE-HOMATROPINE 5-1.5 MG/5ML PO SYRP: 5.0000 mL | ORAL_SOLUTION | Freq: Three times a day (TID) | ORAL | 0 refills | Status: DC | PRN

## 2018-03-18 NOTE — Telephone Encounter (Signed)
Ok Hycodan OV if not better Thx

## 2018-03-19 NOTE — Telephone Encounter (Signed)
Patient advised rx has been sent in

## 2018-03-25 ENCOUNTER — Ambulatory Visit (INDEPENDENT_AMBULATORY_CARE_PROVIDER_SITE_OTHER): Payer: PRIVATE HEALTH INSURANCE | Admitting: Pulmonary Disease

## 2018-03-25 ENCOUNTER — Encounter: Payer: Self-pay | Admitting: Pulmonary Disease

## 2018-03-25 DIAGNOSIS — J849 Interstitial pulmonary disease, unspecified: Secondary | ICD-10-CM | POA: Diagnosis not present

## 2018-03-25 MED ORDER — AZITHROMYCIN 250 MG PO TABS: ORAL_TABLET | ORAL | 0 refills | Status: DC

## 2018-03-25 NOTE — Progress Notes (Addendum)
Ricardo [name]    [id number]    Aug 10, 1950  Primary Care Physician:[name], Evie Lacks, MD  Referring Physician: [name], MD [place], [place] [address]  Chief complaint: Consult for interstitial lung disease  HPI: 68 year old with history of diabetes, hypertension, hyperlipidemia Recently underwent coronary CT for evaluation of heart disease.  Lung images show interstitial lung disease and he has been referred here for further evaluation.  Complains of longstanding dyspnea on exertion but he has noticed some worsening over the past few years.  He gets short of breath while playing with his grandchildren.  Reports episode of bronchitis in early January.  Still has some cough with green/brown mucus production, occasional wheezing. Reports chronic heartburn symptoms with acid reflux.  Currently not on any therapy for this.  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 ILD questionnaire 03/25/2018: No significant exposure.  No mold, hot tub, Jacuzzi, humidifier Smoking history: Never smoker Travel history: Grew up in [place].  Moved to [place] 30 years ago.  No difficult recent travel Relevant family history: No family history of lung disease.  Outpatient Encounter Medications as of 03/25/2018  Medication Sig  . atorvastatin (LIPITOR) 20 MG tablet TAKE 1 TABLET (20 MG TOTAL) BY MOUTH DAILY.  [name] glipiZIDE (GLUCOTROL XL) 10 MG 24 hr tablet TAKE 1 TABLET (10 MG TOTAL) BY MOUTH DAILY.  [name] ipratropium (ATROVENT) 0.06 % nasal spray Place 2 sprays into both nostrils 4 (four) times daily.  [name] levothyroxine (SYNTHROID, LEVOTHROID) 100 MCG tablet TAKE 1 TABLET BY MOUTH EVERY DAY  . losartan (COZAAR) 100 MG tablet TAKE 1 TABLET BY MOUTH ONCE DAILY  . metFORMIN (GLUCOPHAGE) 1000 MG tablet TAKE 1 TAB BY MOUTH TWICE DAILY.PATIENT NEEDS OFFICE VISIT BEFORE REFILLS WILL BE GIVEN  . metoprolol tartrate  (LOPRESSOR) 100 MG tablet Take 1 tablet 2 hours prior to Cardiac CT  . pioglitazone (ACTOS) 15 MG tablet Take 2 tablets (30 mg total) by mouth daily.  . valACYclovir (VALTREX) 500 MG tablet TAKE 1 TABLET (500 MG TOTAL) BY MOUTH DAILY.  . [DISCONTINUED] azithromycin (ZITHROMAX Z-PAK) 250 MG tablet As directed  . [DISCONTINUED] benzonatate (TESSALON) 200 MG capsule Take 1 capsule (200 mg total) by mouth 2 (two) times daily as needed for cough.  [name] aspirin 81 MG tablet Take 81 mg by mouth daily.    [name] HYDROcodone-homatropine (HYCODAN) 5-1.5 MG/5ML syrup Take 5 mLs by mouth every 8 (eight) hours as needed for cough. (Patient not taking: Reported on 03/25/2018)   No facility-administered encounter medications on file as of 03/25/2018.     Allergies as of 03/25/2018  . (No Known Allergies)    Past Medical History:  Diagnosis Date  . ARTHRITIS, HIP 08/10/2008  . BURSITIS, RIGHT SHOULDER 05/20/2008  . Degeneration of lumbar or lumbosacral intervertebral disc 11/27/2006  . DEGENERATION, CERVICAL DISC 11/27/2006  . Degenerative disc disease, lumbar   . Depression   . DEPRESSION 11/25/2006  . Diabetes mellitus   . DIABETES MELLITUS, TYPE II 04/10/2007  . Epicondylitis    bilateral  . Fatigue   . HERPES GENITALIS 09/17/2007  . Hyperlipemia   . HYPERLIPIDEMIA 03/31/2007  . Hypertension   . HYPERTENSION 11/25/2006  . HYPOTHYROIDISM 10/28/2007  . Impaired fasting glucose 12/26/2006  . Lateral epicondylitis  of elbow 05/05/2007  . Sciatica 10/28/2007  . SHOULDER PAIN, RIGHT 08/18/2008    Past Surgical  History:  Procedure Laterality Date  . ACHILLES TENDON REPAIR     left  . ELBOW SURGERY  2005   left  . herniated lumbar disc  1997, 2011  . KNEE ARTHROSCOPY  2007   left  . ROTATOR CUFF REPAIR  1997   right    Family History  Problem Relation Age of Onset  . Diabetes Mother   . Heart disease Mother   . Heart disease Father   . Diabetes Sister   . Colon cancer Neg Hx     Social History    Socioeconomic History  . Marital status: Married    Spouse name: Not on file  . Number of children: Not on file  . Years of education: Not on file  . Highest education level: Not on file  Occupational History  . Not on file  Social Needs  . Financial resource strain: Not on file  . Food insecurity:    Worry: Not on file    Inability: Not on file  . Transportation needs:    Medical: Not on file    Non-medical: Not on file  Tobacco Use  . Smoking status: Never Smoker  . Smokeless tobacco: Never Used  Substance and Sexual Activity  . Alcohol use: Yes    Alcohol/week: 3.0 standard drinks    Types: 3 Cans of beer per week    Comment: occ   . Drug use: No  . Sexual activity: Yes  Lifestyle  . Physical activity:    Days per week: Not on file    Minutes per session: Not on file  . Stress: Not on file  Relationships  . Social connections:    Talks on phone: Not on file    Gets together: Not on file    Attends religious service: Not on file    Active member of club or organization: Not on file    Attends meetings of clubs or organizations: Not on file    Relationship status: Not on file  . Intimate partner violence:    Fear of current or ex partner: Not on file    Emotionally abused: Not on file    Physically abused: Not on file    Forced sexual activity: Not on file  Other Topics Concern  . Not on file  Social History Narrative  . Not on file    Review of systems: Review of Systems  Constitutional: Negative for fever and chills.  HENT: Negative.   Eyes: Negative for blurred vision.  Respiratory: as per HPI  Cardiovascular: Negative for chest pain and palpitations.  Gastrointestinal: Negative for vomiting, diarrhea, blood per rectum. Genitourinary: Negative for dysuria, urgency, frequency and hematuria.  Musculoskeletal: Negative for myalgias, back pain and joint pain.  Skin: Negative for itching and rash.  Neurological: Negative for dizziness, tremors, focal  weakness, seizures and loss of consciousness.  Endo/Heme/Allergies: Negative for environmental allergies.  Psychiatric/Behavioral: Negative for depression, suicidal ideas and hallucinations.  All other systems reviewed and are negative.  Physical Exam: Blood pressure (!) 148/70, pulse 67, height 5\' 11"  (1.803 m), weight 167 lb 6.4 oz (75.9 kg), SpO2 97 %. Gen:      No acute distress HEENT:  EOMI, sclera anicteric Neck:     No masses; no thyromegaly Lungs:    Basal crackles; normal respiratory effort CV:         Regular rate and rhythm; no murmurs Abd:      + bowel sounds; soft, non-tender; no palpable masses,  no distension Ext:    No edema; adequate peripheral perfusion Skin:      Warm and dry; no rash Neuro: alert and oriented x 3 Psych: normal mood and affect  Data Reviewed: Imaging: CT chest 04/01/2007- no pulmonary embolism.  Linear atelectasis in both lobes with patchy groundglass opacities.  CT chest 04/24/2006-negative for PE, linear scarring or atelectasis in both lower lobes  CT coronary 02/18/2018-visualized lung images show peripheral groundglass opacities, atelectasis, scarring at the base.  Does not appear to be significantly worse compared to 2008. I have reviewed the images personally.  PFTs: Pending  Labs: CBC 02/27/2018-WBC 7.4, eos 5%, absolute eosinophil count 370  Assessment:  Evaluation for interstitial lung disease CT scan coronary is reviewed.  Visualized lung images show basal groundglass opacities atelectasis and scarring.  This appears to be stable by my review compared to CT scans of the chest dating back to 2009 Given his symptoms of GERD I question chronic aspiration Will get high-resolution CT and PFTs for better evaluation of this process.  We will delay this by 1 month enable him to get over acute bronchitis.  Consider swallow eval and esophagram  Acute bronchitis Z-Pak Advised Mucinex over-the-counter Stay well-hydrated  Plan/Recommendations: -  Z-Pak, Mucinex - High-res CT, PFTs in 1 month  Marshell Garfinkel MD [facility] 03/25/2018, 3:14 PM  CC: [name], Evie Lacks, MD

## 2018-03-25 NOTE — Patient Instructions (Signed)
We will give a Z-Pak today for bronchitis Use Mucinex over-the-counter Stay well-hydrated to help with clearance of secretion  We will schedule high-resolution CT and PFTs in 1 month for evaluation of your lung Follow-up in ILD clinic after test for review.

## 2018-03-27 LAB — HM DIABETES EYE EXAM

## 2018-04-03 ENCOUNTER — Encounter: Payer: Self-pay | Admitting: Endocrinology

## 2018-04-09 ENCOUNTER — Encounter: Payer: Self-pay | Admitting: Internal Medicine

## 2018-04-22 ENCOUNTER — Other Ambulatory Visit: Payer: Self-pay | Admitting: Pulmonary Disease

## 2018-04-22 ENCOUNTER — Inpatient Hospital Stay: Admission: RE | Admit: 2018-04-22 | Payer: PRIVATE HEALTH INSURANCE | Source: Ambulatory Visit

## 2018-04-22 DIAGNOSIS — R0602 Shortness of breath: Secondary | ICD-10-CM

## 2018-04-22 NOTE — Progress Notes (Unsigned)
Per Dr. Vaughan Browner- order CXR and move PFT up per insurance request for [facility].  CXR has been ordered.  PFT has been moved to 04/23/18 at 10:00a. Pt is aware and voiced his understanding.

## 2018-04-23 ENCOUNTER — Ambulatory Visit (INDEPENDENT_AMBULATORY_CARE_PROVIDER_SITE_OTHER)
Admission: RE | Admit: 2018-04-23 | Discharge: 2018-04-23 | Disposition: A | Discharge status: Home / Self Care | Payer: PRIVATE HEALTH INSURANCE | Source: Ambulatory Visit | Attending: Pulmonary Disease | Admitting: Pulmonary Disease

## 2018-04-23 ENCOUNTER — Ambulatory Visit (INDEPENDENT_AMBULATORY_CARE_PROVIDER_SITE_OTHER): Payer: PRIVATE HEALTH INSURANCE | Admitting: Pulmonary Disease

## 2018-04-23 DIAGNOSIS — J849 Interstitial pulmonary disease, unspecified: Secondary | ICD-10-CM

## 2018-04-23 DIAGNOSIS — R0602 Shortness of breath: Secondary | ICD-10-CM

## 2018-04-23 LAB — PULMONARY FUNCTION TEST
DL/VA % PRED: 102 %
DL/VA: 4.31 ml/min/mmHg/L
DLCO cor % pred: 76 %
DLCO cor: 17.93 ml/min/mmHg
DLCO unc % pred: 76 %
DLCO unc: 17.93 ml/min/mmHg
FEF 25-75 Post: 3.79 L/sec
FEF 25-75 Pre: 3.14 L/sec
FEF2575-%CHANGE-POST: 20 %
FEF2575-%Pred-Post: 164 %
FEF2575-%Pred-Pre: 135 %
FEV1-%Change-Post: 5 %
FEV1-%Pred-Post: 83 %
FEV1-%Pred-Pre: 78 %
FEV1-Post: 2.42 L
FEV1-Pre: 2.29 L
FEV1FVC-%CHANGE-POST: 3 %
FEV1FVC-%Pred-Pre: 118 %
FEV6-%Change-Post: 2 %
FEV6-%Pred-Post: 71 %
FEV6-%Pred-Pre: 70 %
FEV6-Post: 2.65 L
FEV6-Pre: 2.59 L
FEV6FVC-%Change-Post: 0 %
FEV6FVC-%Pred-Post: 106 %
FEV6FVC-%Pred-Pre: 105 %
FVC-%Change-Post: 2 %
FVC-%Pred-Post: 67 %
FVC-%Pred-Pre: 66 %
FVC-POST: 2.65 L
FVC-Pre: 2.59 L
Post FEV1/FVC ratio: 91 %
Post FEV6/FVC ratio: 100 %
Pre FEV1/FVC ratio: 88 %
Pre FEV6/FVC Ratio: 100 %
RV % pred: 96 %
RV: 2.08 L
TLC % pred: 77 %
TLC: 4.83 L

## 2018-04-23 NOTE — Progress Notes (Signed)
PFT done today. 

## 2018-04-24 ENCOUNTER — Ambulatory Visit (INDEPENDENT_AMBULATORY_CARE_PROVIDER_SITE_OTHER): Payer: PRIVATE HEALTH INSURANCE | Admitting: Pulmonary Disease

## 2018-04-24 ENCOUNTER — Encounter: Payer: Self-pay | Admitting: Pulmonary Disease

## 2018-04-24 VITALS — BP 126/74 | HR 71 | Ht 66.0 in | Wt 164.8 lb

## 2018-04-24 DIAGNOSIS — R0602 Shortness of breath: Secondary | ICD-10-CM | POA: Diagnosis not present

## 2018-04-24 DIAGNOSIS — J849 Interstitial pulmonary disease, unspecified: Secondary | ICD-10-CM

## 2018-04-24 NOTE — Patient Instructions (Signed)
I have reviewed your chest x-ray and pulmonary function test which do not show any acute abnormality.  Mild reduction in lung capacity We are awaiting approval of CT of the chest which will give [place] more information  Follow-up in 4 weeks.

## 2018-04-24 NOTE — Progress Notes (Signed)
Ricardo [name]    [id number]    1950/05/23  Primary Care Physician:[name], Evie Lacks, MD  Referring Physician: [name], MD [place], [place] [address]  Chief complaint: Follow-up for interstitial lung disease  HPI: 68 year old with history of diabetes, hypertension, hyperlipidemia Recently underwent coronary CT for evaluation of heart disease.  Lung images show interstitial lung disease and he has been referred here for further evaluation.  Complains of longstanding dyspnea on exertion but he has noticed some worsening over the past few years.  He gets short of breath while playing with his grandchildren.  Reports episode of bronchitis in early January.  Still has some cough with green/brown mucus production, occasional wheezing. Reports chronic heartburn symptoms with acid reflux.  Currently not on any therapy for this.  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 ILD questionnaire 03/25/2018: No significant exposure.  No mold, hot tub, Jacuzzi, humidifier Smoking history: Never smoker Travel history: Grew up in [place].  Moved to [place] 30 years ago.  No difficult recent travel Relevant family history: No family history of lung disease.  Interim history: Here for review of PFTs and chest x-ray.  Feels that continues to have dyspnea on exertion, occasional symptoms of chest tightness while exerting himself  We had ordered a high-resolution CT of the chest at last visit but has been denied by insurance.  We are currently in the process of appealing this.  Outpatient Encounter Medications as of 04/24/2018  Medication Sig  . aspirin 81 MG tablet Take 81 mg by mouth daily.    [name] atorvastatin (LIPITOR) 20 MG tablet TAKE 1 TABLET (20 MG TOTAL) BY MOUTH DAILY.  [name] glipiZIDE (GLUCOTROL XL) 10 MG 24 hr tablet TAKE 1 TABLET (10 MG TOTAL) BY MOUTH DAILY.  [name] ipratropium (ATROVENT) 0.06 %  nasal spray Place 2 sprays into both nostrils 4 (four) times daily.  [name] levothyroxine (SYNTHROID, LEVOTHROID) 100 MCG tablet TAKE 1 TABLET BY MOUTH EVERY DAY  . losartan (COZAAR) 100 MG tablet TAKE 1 TABLET BY MOUTH ONCE DAILY  . metFORMIN (GLUCOPHAGE) 1000 MG tablet TAKE 1 TAB BY MOUTH TWICE DAILY.PATIENT NEEDS OFFICE VISIT BEFORE REFILLS WILL BE GIVEN  . metoprolol tartrate (LOPRESSOR) 100 MG tablet Take 1 tablet 2 hours prior to Cardiac CT  . pioglitazone (ACTOS) 15 MG tablet Take 2 tablets (30 mg total) by mouth daily.  . valACYclovir (VALTREX) 500 MG tablet TAKE 1 TABLET (500 MG TOTAL) BY MOUTH DAILY.  . [DISCONTINUED] azithromycin (ZITHROMAX) 250 MG tablet 2 tablets on the first day followed by 1 daily until finished  . [DISCONTINUED] HYDROcodone-homatropine (HYCODAN) 5-1.5 MG/5ML syrup Take 5 mLs by mouth every 8 (eight) hours as needed for cough.   No facility-administered encounter medications on file as of 04/24/2018.     Allergies as of 04/24/2018  . (No Known Allergies)    Past Medical History:  Diagnosis Date  . ARTHRITIS, HIP 08/10/2008  . BURSITIS, RIGHT SHOULDER 05/20/2008  . Degeneration of lumbar or lumbosacral intervertebral disc 11/27/2006  . DEGENERATION, CERVICAL DISC 11/27/2006  . Degenerative disc disease, lumbar   . Depression   . DEPRESSION 11/25/2006  . Diabetes mellitus   . DIABETES MELLITUS, TYPE II 04/10/2007  . Epicondylitis    bilateral  . Fatigue   . HERPES GENITALIS 09/17/2007  . Hyperlipemia   . HYPERLIPIDEMIA 03/31/2007  . Hypertension   .  HYPERTENSION 11/25/2006  . HYPOTHYROIDISM 10/28/2007  . Impaired fasting glucose 12/26/2006  . Lateral epicondylitis  of elbow 05/05/2007  . Sciatica 10/28/2007  . SHOULDER PAIN, RIGHT 08/18/2008    Past Surgical History:  Procedure Laterality Date  . ACHILLES TENDON REPAIR     left  . ELBOW SURGERY  2005   left  . herniated lumbar disc  1997, 2011  . KNEE ARTHROSCOPY  2007   left  . ROTATOR CUFF REPAIR  1997    right    Family History  Problem Relation Age of Onset  . Diabetes Mother   . Heart disease Mother   . Heart disease Father   . Diabetes Sister   . Colon cancer Neg Hx     Social History   Socioeconomic History  . Marital status: Married    Spouse name: Not on file  . Number of children: Not on file  . Years of education: Not on file  . Highest education level: Not on file  Occupational History  . Not on file  Social Needs  . Financial resource strain: Not on file  . Food insecurity:    Worry: Not on file    Inability: Not on file  . Transportation needs:    Medical: Not on file    Non-medical: Not on file  Tobacco Use  . Smoking status: Never Smoker  . Smokeless tobacco: Never Used  Substance and Sexual Activity  . Alcohol use: Yes    Alcohol/week: 3.0 standard drinks    Types: 3 Cans of beer per week    Comment: occ   . Drug use: No  . Sexual activity: Yes  Lifestyle  . Physical activity:    Days per week: Not on file    Minutes per session: Not on file  . Stress: Not on file  Relationships  . Social connections:    Talks on phone: Not on file    Gets together: Not on file    Attends religious service: Not on file    Active member of club or organization: Not on file    Attends meetings of clubs or organizations: Not on file    Relationship status: Not on file  . Intimate partner violence:    Fear of current or ex partner: Not on file    Emotionally abused: Not on file    Physically abused: Not on file    Forced sexual activity: Not on file  Other Topics Concern  . Not on file  Social History Narrative  . Not on file    Review of systems: Review of Systems  Constitutional: Negative for fever and chills.  HENT: Negative.   Eyes: Negative for blurred vision.  Respiratory: as per HPI  Cardiovascular: Negative for chest pain and palpitations.  Gastrointestinal: Negative for vomiting, diarrhea, blood per rectum. Genitourinary: Negative for dysuria,  urgency, frequency and hematuria.  Musculoskeletal: Negative for myalgias, back pain and joint pain.  Skin: Negative for itching and rash.  Neurological: Negative for dizziness, tremors, focal weakness, seizures and loss of consciousness.  Endo/Heme/Allergies: Negative for environmental allergies.  Psychiatric/Behavioral: Negative for depression, suicidal ideas and hallucinations.  All other systems reviewed and are negative.  Physical Exam: Blood pressure (!) 148/70, pulse 67, height 5\' 11"  (1.803 m), weight 167 lb 6.4 oz (75.9 kg), SpO2 97 %. Gen:      No acute distress HEENT:  EOMI, sclera anicteric Neck:     No masses; no thyromegaly Lungs:  Basal crackles; normal respiratory effort CV:         Regular rate and rhythm; no murmurs Abd:      + bowel sounds; soft, non-tender; no palpable masses, no distension Ext:    No edema; adequate peripheral perfusion Skin:      Warm and dry; no rash Neuro: alert and oriented x 3 Psych: normal mood and affect  Data Reviewed: Imaging: CT chest 04/01/2007- no pulmonary embolism.  Linear atelectasis in both lobes with patchy groundglass opacities.  CT chest 04/24/2006-negative for PE, linear scarring or atelectasis in both lower lobes  CT coronary 02/18/2018-visualized lung images show peripheral groundglass opacities, atelectasis, scarring at the left base.  Does not appear to be significantly worse compared to 2008.  Chest x-ray 04/23/2018- linear atelectasis at the left base, mild elevation of left hemidiaphragm. I have reviewed the images personally.  PFTs: 04/23/2018 FVC 2.65 [61%), FEV1 2.42 [83%),/F 91, TLC 4.83 [77%], DLCO 17.93 [76%]  Mild restriction with minimal diffusion defect.  Labs: CBC 02/27/2018-WBC 7.4, eos 5%, absolute eosinophil count 370  Assessment:  Evaluation for interstitial lung disease CT scan coronary is reviewed.  Visualized lung images show basal groundglass opacities atelectasis and scarring.  This appears to be  stable by my review compared to CT scans of the chest dating back to 2009 Given his symptoms of GERD I question chronic aspiration  PFTs reviewed with mild restriction and minimal diffusion defect.  Chest x-ray shows linear opacity at the left base that corresponds to a platelike scarring on the CT coronary This can be very still visualized on the planned high-resolution CT of the chest  Plan/Recommendations: - High Resolution CT of chest.  Marshell Garfinkel MD [facility] 04/24/2018, 3:50 PM  CC: [name], Evie Lacks, MD

## 2018-04-25 ENCOUNTER — Inpatient Hospital Stay: Admission: RE | Admit: 2018-04-25 | Payer: PRIVATE HEALTH INSURANCE | Source: Ambulatory Visit

## 2018-05-02 ENCOUNTER — Inpatient Hospital Stay: Admission: RE | Admit: 2018-05-02 | Payer: PRIVATE HEALTH INSURANCE | Source: Ambulatory Visit

## 2018-05-06 ENCOUNTER — Ambulatory Visit: Payer: PRIVATE HEALTH INSURANCE | Admitting: Pulmonary Disease

## 2018-05-07 ENCOUNTER — Ambulatory Visit (INDEPENDENT_AMBULATORY_CARE_PROVIDER_SITE_OTHER)
Admission: RE | Admit: 2018-05-07 | Discharge: 2018-05-07 | Disposition: A | Discharge status: Home / Self Care | Payer: PRIVATE HEALTH INSURANCE | Source: Ambulatory Visit | Attending: Pulmonary Disease | Admitting: Pulmonary Disease

## 2018-05-07 ENCOUNTER — Other Ambulatory Visit: Payer: Self-pay

## 2018-05-07 DIAGNOSIS — J849 Interstitial pulmonary disease, unspecified: Secondary | ICD-10-CM | POA: Diagnosis not present

## 2018-05-12 ENCOUNTER — Other Ambulatory Visit: Payer: Self-pay | Admitting: Pulmonary Disease

## 2018-05-12 DIAGNOSIS — J849 Interstitial pulmonary disease, unspecified: Secondary | ICD-10-CM

## 2018-05-13 ENCOUNTER — Telehealth: Payer: Self-pay | Admitting: Pulmonary Disease

## 2018-05-13 NOTE — Telephone Encounter (Signed)
Called patient unable to reach LMTCB 

## 2018-05-14 NOTE — Telephone Encounter (Signed)
Notes recorded by Shon Hale, CMA on 05/13/2018 at 10:01 AM EDT lmtcb x2 for pt ------  Notes recorded by Shon Hale, CMA on 05/12/2018 at 2:59 PM EDT Labs have been ordered. Lm to make pt aware. ------  Notes recorded by Vivia Ewing, LPN on 10/21/35 at 04:88 PM EDT Call made to patient, made aware of results. Voiced understanding. Patient states he does not feel that he needs to keep [place] f/u appt as he feels that he is stable and will call if he needs anything. Informed patient I would let Dr. Vaughan Browner know. Nothing further is needed at this time. He states if you would still like the lab work he would be willing to stop by if needed. ------  Notes recorded by Marshell Garfinkel, MD on 05/09/2018 at 7:12 AM EDT Please let patient know that CT shows mild scarring at lung base that is unchanged since 2009.  We will discuss further and get some labs for evaluation at time of clinic visit this month.  Spoke with patient. He stated that he is still ok with not scheduling another visit with Dr. Vaughan Browner and does not want lab work. Advised him that I would cancel the orders placed. He verbalized understanding. Nothing further needed at time of call.

## 2018-05-14 NOTE — Addendum Note (Signed)
Addended by: Valerie Salts on: 05/14/2018 10:04 AM   Modules accepted: Orders

## 2018-05-15 ENCOUNTER — Other Ambulatory Visit: Payer: Self-pay

## 2018-05-15 ENCOUNTER — Other Ambulatory Visit (INDEPENDENT_AMBULATORY_CARE_PROVIDER_SITE_OTHER): Payer: PRIVATE HEALTH INSURANCE

## 2018-05-15 DIAGNOSIS — E1165 Type 2 diabetes mellitus with hyperglycemia: Secondary | ICD-10-CM

## 2018-05-15 DIAGNOSIS — E785 Hyperlipidemia, unspecified: Secondary | ICD-10-CM | POA: Diagnosis not present

## 2018-05-15 LAB — COMPREHENSIVE METABOLIC PANEL
ALBUMIN: 4.3 g/dL (ref 3.5–5.2)
ALT: 10 U/L (ref 0–53)
AST: 12 U/L (ref 0–37)
Alkaline Phosphatase: 31 U/L — ABNORMAL LOW (ref 39–117)
BUN: 23 mg/dL (ref 6–23)
CO2: 32 mEq/L (ref 19–32)
Calcium: 9.6 mg/dL (ref 8.4–10.5)
Chloride: 100 mEq/L (ref 96–112)
Creatinine, Ser: 1.13 mg/dL (ref 0.40–1.50)
GFR: 64.88 mL/min (ref >60.00)
Glucose, Bld: 123 mg/dL — ABNORMAL HIGH (ref 70–99)
Potassium: 4.2 mEq/L (ref 3.5–5.1)
SODIUM: 137 meq/L (ref 135–145)
Total Bilirubin: 0.5 mg/dL (ref 0.2–1.2)
Total Protein: 7 g/dL (ref 6.0–8.3)

## 2018-05-15 LAB — LIPID PANEL
Cholesterol: 232 mg/dL — ABNORMAL HIGH (ref 0–200)
HDL: 49.2 mg/dL (ref >39.00)
LDL Cholesterol: 165 mg/dL — ABNORMAL HIGH (ref 0–99)
NonHDL: 182.72
Total CHOL/HDL Ratio: 5
Triglycerides: 91 mg/dL (ref 0.0–149.0)
VLDL: 18.2 mg/dL (ref 0.0–40.0)

## 2018-05-15 LAB — HEMOGLOBIN A1C: Hgb A1c MFr Bld: 8.1 % — ABNORMAL HIGH (ref 4.6–6.5)

## 2018-05-16 ENCOUNTER — Other Ambulatory Visit: Payer: Self-pay

## 2018-05-18 NOTE — Progress Notes (Signed)
Patient ID: Ricardo [name], male   DOB: 04/25/1950, 68 y.o.   MRN: [id number]           Reason for Appointment: Follow-up for Type 2 Diabetes  Referring physician:  [name]   History of Present Illness:          Diagnosis: Type 2 diabetes mellitus, date of diagnosis: 2009       Past history: At diagnosis he was having symptoms of fatigue.  He was started on glipizide initially and subsequently metformin was added.  Further details are not available However he has been somewhat irregular with his follow-up and did not have an A1c with his PCP between 8/13 and 11/15 He was referred here because of rising A1c of 7.5 He had been started on Trulicity in 3/89 because of gradually increasing A1c He stopped taking Trulicity in 3734 because of out-of-pocket expense  Recent history:     Oral hypoglycemic drugs the patient is taking are: glipizide ER 10 mg, metformin 1 g twice a day, Actos 15 mg daily     He has not been seen in follow-up since 05/2017  His A1c is persistently high now with a level of 8.1-9.1 since 12/18   Current blood sugar patterns, problems identified and management:  He had an A1c of 9.1 with his PCP and he thinks this is from poor diet  Again he is not consistently watching his diet and has generally been not motivated even though on his last visit he was starting to do better  He is checking his blood sugars mostly early morning and unable to get a download from his meter  Usually not checking his blood sugars after meals  Most of his poor diet is related to high carbohydrate high fat foods and snacks  Also not motivated to do any formal exercise but may be at times active with yard work  His blood sugar may be low normal if he is active with exercise and delays his meal        Side effects from medications have been: None Compliance with the medical regimen: Fair Hypoglycemia: Rarely    Glucose monitoring:  done usually one time a day          Glucometer:  [place]  Blood Glucose readings reviewed from monitor directly  30-day average 129 Recent range 88-186 Fasting range recently 101-161 Nonfasting 90-136   Self-care:  Meals: 3 meals per day. Breakfast is oatmeal/cereal/eggs and toast.  Has half sandwich or soup at lunch, avoiding rice and carbs usually       Exercise:  Occasionally walking or going on exercise bike  Dietician visit, most recent: at diagnosis               Weight history:   Wt Readings from Last 3 Encounters:  05/19/18 165 lb (74.8 kg)  04/24/18 164 lb 12.8 oz (74.8 kg)  03/25/18 167 lb 6.4 oz (75.9 kg)    Glycemic control:   Lab Results  Component Value Date   HGBA1C 8.1 (H) 05/15/2018   HGBA1C 9.1 (H) 02/27/2018   HGBA1C 8.3 (H) 06/06/2017   Lab Results  Component Value Date   MICROALBUR <0.7 02/27/2018   LDLCALC 165 (H) 05/15/2018   CREATININE 1.13 05/15/2018   Appointment on 05/15/2018  Component Date Value Ref Range Status  . Cholesterol 05/15/2018 232* 0 - 200 mg/dL Final   ATP III Classification       Desirable:  < 200 mg/dL  Borderline High:  200 - 239 mg/dL          High:  > = 240 mg/dL  . Triglycerides 05/15/2018 91.0  0.0 - 149.0 mg/dL Final   Normal:  <150 mg/dLBorderline High:  150 - 199 mg/dL  . HDL 05/15/2018 49.20  >39.00 mg/dL Final  . VLDL 05/15/2018 18.2  0.0 - 40.0 mg/dL Final  . LDL Cholesterol 05/15/2018 165* 0 - 99 mg/dL Final  . Total CHOL/HDL Ratio 05/15/2018 5   Final                  Men          Women1/2 Average Risk     3.4          3.3Average Risk          5.0          4.42X Average Risk          9.6          7.13X Average Risk          15.0          11.0                      . NonHDL 05/15/2018 182.72   Final   NOTE:  Non-HDL goal should be 30 mg/dL higher than patient's LDL goal (i.e. LDL goal of < 70 mg/dL, would have non-HDL goal of < 100 mg/dL)  . Sodium 05/15/2018 137  135 - 145 mEq/L Final  . Potassium 05/15/2018 4.2  3.5 - 5.1 mEq/L  Final  . Chloride 05/15/2018 100  96 - 112 mEq/L Final  . CO2 05/15/2018 32  19 - 32 mEq/L Final  . Glucose, Bld 05/15/2018 123* 70 - 99 mg/dL Final  . BUN 05/15/2018 23  6 - 23 mg/dL Final  . Creatinine, Ser 05/15/2018 1.13  0.40 - 1.50 mg/dL Final  . Total Bilirubin 05/15/2018 0.5  0.2 - 1.2 mg/dL Final  . Alkaline Phosphatase 05/15/2018 31* 39 - 117 U/L Final  . AST 05/15/2018 12  0 - 37 U/L Final  . ALT 05/15/2018 10  0 - 53 U/L Final  . Total Protein 05/15/2018 7.0  6.0 - 8.3 g/dL Final  . Albumin 05/15/2018 4.3  3.5 - 5.2 g/dL Final  . Calcium 05/15/2018 9.6  8.4 - 10.5 mg/dL Final  . GFR 05/15/2018 64.88  >60.00 mL/min Final  . Hgb A1c MFr Bld 05/15/2018 8.1* 4.6 - 6.5 % Final   Glycemic Control Guidelines for People with Diabetes:Non Diabetic:  <6%Goal of Therapy: <7%Additional Action Suggested:  >8%      Allergies as of 05/19/2018   No Known Allergies     Medication List       Accurate as of May 19, 2018  8:24 AM. Always use your most recent med list.        aspirin 81 MG tablet Take 81 mg by mouth daily.   atorvastatin 20 MG tablet Commonly known as:  LIPITOR TAKE 1 TABLET (20 MG TOTAL) BY MOUTH DAILY.   glipiZIDE 10 MG 24 hr tablet Commonly known as:  GLUCOTROL XL TAKE 1 TABLET (10 MG TOTAL) BY MOUTH DAILY.   ipratropium 0.06 % nasal spray Commonly known as:  Atrovent Place 2 sprays into both nostrils 4 (four) times daily.   levothyroxine 100 MCG tablet Commonly known as:  SYNTHROID, LEVOTHROID TAKE 1 TABLET BY MOUTH EVERY DAY   losartan 100  MG tablet Commonly known as:  COZAAR TAKE 1 TABLET BY MOUTH ONCE DAILY   metFORMIN 1000 MG tablet Commonly known as:  GLUCOPHAGE TAKE 1 TAB BY MOUTH TWICE DAILY.PATIENT NEEDS OFFICE VISIT BEFORE REFILLS WILL BE GIVEN   metoprolol tartrate 100 MG tablet Commonly known as:  LOPRESSOR Take 1 tablet 2 hours prior to Cardiac CT   pioglitazone 15 MG tablet Commonly known as:  Actos Take 2 tablets (30 mg total)  by mouth daily.   valACYclovir 500 MG tablet Commonly known as:  VALTREX TAKE 1 TABLET (500 MG TOTAL) BY MOUTH DAILY.       Allergies: No Known Allergies  Past Medical History:  Diagnosis Date  . ARTHRITIS, HIP 08/10/2008  . BURSITIS, RIGHT SHOULDER 05/20/2008  . Degeneration of lumbar or lumbosacral intervertebral disc 11/27/2006  . DEGENERATION, CERVICAL DISC 11/27/2006  . Degenerative disc disease, lumbar   . Depression   . DEPRESSION 11/25/2006  . Diabetes mellitus   . DIABETES MELLITUS, TYPE II 04/10/2007  . Epicondylitis    bilateral  . Fatigue   . HERPES GENITALIS 09/17/2007  . Hyperlipemia   . HYPERLIPIDEMIA 03/31/2007  . Hypertension   . HYPERTENSION 11/25/2006  . HYPOTHYROIDISM 10/28/2007  . Impaired fasting glucose 12/26/2006  . Lateral epicondylitis  of elbow 05/05/2007  . Sciatica 10/28/2007  . SHOULDER PAIN, RIGHT 08/18/2008    Past Surgical History:  Procedure Laterality Date  . ACHILLES TENDON REPAIR     left  . ELBOW SURGERY  2005   left  . herniated lumbar disc  1997, 2011  . KNEE ARTHROSCOPY  2007   left  . ROTATOR CUFF REPAIR  1997   right    Family History  Problem Relation Age of Onset  . Diabetes Mother   . Heart disease Mother   . Heart disease Father   . Diabetes Sister   . Colon cancer Neg Hx     Social History:  reports that he has never smoked. He has never used smokeless tobacco. He reports current alcohol use of about 3.0 standard drinks of alcohol per week. He reports that he does not use drugs.    Review of Systems         Lipids: He has been treated with Lipitor 20 mg   LDL is high since he has not taken medication regularly as he forgets, he says he tries to take it in the evenings. Even though he was reminded by his PCP to start taking this in 1/20 he still is irregular with this  He has been treated by his PCP      Lab Results  Component Value Date   CHOL 232 (H) 05/15/2018   HDL 49.20 05/15/2018   LDLCALC 165 (H)  05/15/2018   LDLDIRECT 126.0 05/12/2014   TRIG 91.0 05/15/2018   CHOLHDL 5 05/15/2018                   Thyroid: He has been on Synthroid for several years years, initially had symptoms of fatigue at diagnosis Currently on 100 mcg  This is followed by PCP  Lab Results  Component Value Date   TSH 3.72 02/27/2018   TSH 3.62 02/25/2017   TSH 2.13 02/13/2016     Currently has no symptoms of numbness or tingling in his feet  Does have some knee joint pains at times  Followed by pulmonologist for pulmonary fibrosis  LABS:  Appointment on 05/15/2018  Component Date Value Ref Range Status  .  Cholesterol 05/15/2018 232* 0 - 200 mg/dL Final   ATP III Classification       Desirable:  < 200 mg/dL               Borderline High:  200 - 239 mg/dL          High:  > = 240 mg/dL  . Triglycerides 05/15/2018 91.0  0.0 - 149.0 mg/dL Final   Normal:  <150 mg/dLBorderline High:  150 - 199 mg/dL  . HDL 05/15/2018 49.20  >39.00 mg/dL Final  . VLDL 05/15/2018 18.2  0.0 - 40.0 mg/dL Final  . LDL Cholesterol 05/15/2018 165* 0 - 99 mg/dL Final  . Total CHOL/HDL Ratio 05/15/2018 5   Final                  Men          Women1/2 Average Risk     3.4          3.3Average Risk          5.0          4.42X Average Risk          9.6          7.13X Average Risk          15.0          11.0                      . NonHDL 05/15/2018 182.72   Final   NOTE:  Non-HDL goal should be 30 mg/dL higher than patient's LDL goal (i.e. LDL goal of < 70 mg/dL, would have non-HDL goal of < 100 mg/dL)  . Sodium 05/15/2018 137  135 - 145 mEq/L Final  . Potassium 05/15/2018 4.2  3.5 - 5.1 mEq/L Final  . Chloride 05/15/2018 100  96 - 112 mEq/L Final  . CO2 05/15/2018 32  19 - 32 mEq/L Final  . Glucose, Bld 05/15/2018 123* 70 - 99 mg/dL Final  . BUN 05/15/2018 23  6 - 23 mg/dL Final  . Creatinine, Ser 05/15/2018 1.13  0.40 - 1.50 mg/dL Final  . Total Bilirubin 05/15/2018 0.5  0.2 - 1.2 mg/dL Final  . Alkaline Phosphatase  05/15/2018 31* 39 - 117 U/L Final  . AST 05/15/2018 12  0 - 37 U/L Final  . ALT 05/15/2018 10  0 - 53 U/L Final  . Total Protein 05/15/2018 7.0  6.0 - 8.3 g/dL Final  . Albumin 05/15/2018 4.3  3.5 - 5.2 g/dL Final  . Calcium 05/15/2018 9.6  8.4 - 10.5 mg/dL Final  . GFR 05/15/2018 64.88  >60.00 mL/min Final  . Hgb A1c MFr Bld 05/15/2018 8.1* 4.6 - 6.5 % Final   Glycemic Control Guidelines for People with Diabetes:Non Diabetic:  <6%Goal of Therapy: <7%Additional Action Suggested:  >8%     Physical Examination:  BP 126/70 (BP Location: Left Arm, Patient Position: Sitting, Cuff Size: Normal)   Pulse 67   Temp 97.6 F (36.4 C) (Oral)   Ht 5\' 6"  (1.676 m)   Wt 165 lb (74.8 kg)   SpO2 97%   BMI 26.63 kg/m          No ankle edema present    ASSESSMENT:  Diabetes type 2, nonobese.   See history of present illness for detailed discussion of his current management, blood sugar patterns and problems identified  He continues to have poor control because of not taking Trulicity which  had been helping significantly previously As before he is not watching his diet the majority of the time only does a little better just before after his office visit He is also not exercising Although patient is reluctant to start Trulicity because of the cost discussed that he likely has progression of his diabetes with his A1c at the highest level recently Also not monitoring after meals which would help him to understand how high his blood sugars go after eating  Likely will have more consistent compliance with his lifestyle and better control long-term with taking Trulicity and this was explained  LIPIDS: Poorly controlled because of inadequate diet as well as forgetting to take his Lipitor Discussed LDL targets  Memory loss: He thinks that he is tending to be more forgetful and does not have a B12 on record but this will be checked on the next visit  PLAN:   8.36 Trulicity weekly, given co-pay card  for free 30-day supply  Start checking sugars after supper consistently  Regular exercise with walking or other activities as tolerated  No consistent follow-up  Since he may tend to have hypoglycemia with adding Trulicity we will reduce his glipizide to 5 mg and possibly lower doses  Take Lipitor daily with his other medications  Total visit time for evaluation and management of multiple problems and counseling =25 minutes   Patient Instructions  Check blood sugars on waking up 3 days a week  Also check blood sugars about 2 hours after meals and do this after different meals by rotation  Recommended blood sugar levels on waking up are 90-130 and about 2 hours after meal is 130-160  Please bring your blood sugar monitor to each visit, thank you  Take Lipitor with pm meds  Increase biking      Elayne Snare 05/19/2018, 8:24 AM   Note: This office note was prepared with Dragon voice recognition system technology. Any transcriptional errors that result from this process are unintentional.

## 2018-05-19 ENCOUNTER — Encounter: Payer: Self-pay | Admitting: Endocrinology

## 2018-05-19 ENCOUNTER — Ambulatory Visit: Payer: PRIVATE HEALTH INSURANCE | Admitting: Endocrinology

## 2018-05-19 ENCOUNTER — Ambulatory Visit (INDEPENDENT_AMBULATORY_CARE_PROVIDER_SITE_OTHER): Payer: PRIVATE HEALTH INSURANCE | Admitting: Endocrinology

## 2018-05-19 ENCOUNTER — Other Ambulatory Visit: Payer: Self-pay

## 2018-05-19 VITALS — BP 126/70 | HR 67 | Temp 97.6°F | Ht 66.0 in | Wt 165.0 lb

## 2018-05-19 DIAGNOSIS — E782 Mixed hyperlipidemia: Secondary | ICD-10-CM

## 2018-05-19 DIAGNOSIS — E1165 Type 2 diabetes mellitus with hyperglycemia: Secondary | ICD-10-CM | POA: Diagnosis not present

## 2018-05-19 DIAGNOSIS — J8410 Pulmonary fibrosis, unspecified: Secondary | ICD-10-CM

## 2018-05-19 DIAGNOSIS — E063 Autoimmune thyroiditis: Secondary | ICD-10-CM | POA: Diagnosis not present

## 2018-05-19 MED ORDER — DULAGLUTIDE 0.75 MG/0.5ML ~~LOC~~ SOAJ: SUBCUTANEOUS | 2 refills | Status: DC

## 2018-05-19 MED ORDER — GLIPIZIDE ER 5 MG PO TB24: 5.0000 mg | ORAL_TABLET | Freq: Every day | ORAL | 2 refills | Status: DC

## 2018-05-19 NOTE — Patient Instructions (Addendum)
Check blood sugars on waking up 3 days a week  Also check blood sugars about 2 hours after meals and do this after different meals by rotation  Recommended blood sugar levels on waking up are 90-130 and about 2 hours after meal is 130-160  Please bring your blood sugar monitor to each visit, thank you  Take Lipitor with pm meds  Increase biking   Take 5 Glipizide with Trulicity  Start TRULICITYwith the pen as shown once weekly on the same day of the week.  You may inject in the stomach, thigh or arm as indicated in the brochure given.  You will feel fullness of the stomach with starting the medication and should try to keep the portions at meals small.  You may experience nausea in the first few days which usually gets better over time   If any questions or concerns are present call the office or the  [place] at [phone].  Also visit [email or web address] website for more useful information

## 2018-05-22 ENCOUNTER — Ambulatory Visit: Payer: PRIVATE HEALTH INSURANCE | Admitting: Pulmonary Disease

## 2018-05-26 ENCOUNTER — Other Ambulatory Visit: Payer: Self-pay

## 2018-05-26 ENCOUNTER — Telehealth: Payer: Self-pay | Admitting: Endocrinology

## 2018-05-26 MED ORDER — DULAGLUTIDE 0.75 MG/0.5ML ~~LOC~~ SOAJ: SUBCUTANEOUS | 2 refills | Status: DC

## 2018-05-26 NOTE — Telephone Encounter (Signed)
Rx sent 

## 2018-05-26 NOTE — Telephone Encounter (Signed)
Dulaglutide (TRULICITY) 2.25 OH/2.0PZ SOPN   Patient stated that he is needing some clarification on this medication that Dr Dwyane Dee prescribed him. He needs to know if he should continue taking his older medication along with the trulicity  (patient did not state which medication was the "older" Medication)    Please advise

## 2018-05-27 ENCOUNTER — Telehealth: Payer: Self-pay | Admitting: Endocrinology

## 2018-05-27 NOTE — Telephone Encounter (Signed)
Patient is requesting a call back to get clarification on his medication TRULICITY  Please advise

## 2018-05-27 NOTE — Telephone Encounter (Signed)
Called pt and provided clarification.

## 2018-05-29 ENCOUNTER — Ambulatory Visit: Payer: PRIVATE HEALTH INSURANCE | Admitting: Internal Medicine

## 2018-07-04 ENCOUNTER — Telehealth: Payer: Self-pay | Admitting: Internal Medicine

## 2018-07-04 NOTE — Telephone Encounter (Signed)
The patient needs a 90 day supply for the insurance to cover the Rx  Dulaglutide (TRULICITY) 9.49 SI/7.3FP SOPN  Sent to:  [id number] - [place], [place] - [address]. AT [address] [phone] (Phone) [phone] (Fax)

## 2018-07-07 ENCOUNTER — Other Ambulatory Visit: Payer: Self-pay

## 2018-07-07 MED ORDER — DULAGLUTIDE 0.75 MG/0.5ML ~~LOC~~ SOAJ: SUBCUTANEOUS | 2 refills | Status: DC

## 2018-07-07 NOTE — Telephone Encounter (Signed)
Rx sent 

## 2018-07-23 ENCOUNTER — Other Ambulatory Visit: Payer: Self-pay

## 2018-07-23 ENCOUNTER — Other Ambulatory Visit (INDEPENDENT_AMBULATORY_CARE_PROVIDER_SITE_OTHER): Payer: PRIVATE HEALTH INSURANCE

## 2018-07-23 DIAGNOSIS — E782 Mixed hyperlipidemia: Secondary | ICD-10-CM | POA: Diagnosis not present

## 2018-07-23 DIAGNOSIS — E063 Autoimmune thyroiditis: Secondary | ICD-10-CM

## 2018-07-23 DIAGNOSIS — J8410 Pulmonary fibrosis, unspecified: Secondary | ICD-10-CM

## 2018-07-23 DIAGNOSIS — E1165 Type 2 diabetes mellitus with hyperglycemia: Secondary | ICD-10-CM | POA: Diagnosis not present

## 2018-07-23 LAB — HEMOGLOBIN A1C: Hgb A1c MFr Bld: 6.8 % — ABNORMAL HIGH (ref 4.6–6.5)

## 2018-07-23 LAB — VITAMIN B12: Vitamin B-12: 575 pg/mL (ref 211–911)

## 2018-07-23 LAB — LDL CHOLESTEROL, DIRECT: Direct LDL: 79 mg/dL

## 2018-07-23 LAB — GLUCOSE, RANDOM: Glucose, Bld: 130 mg/dL — ABNORMAL HIGH (ref 70–99)

## 2018-07-24 NOTE — Progress Notes (Signed)
Patient ID: Ricardo [name], male   DOB: 11-Sep-1950, 68 y.o.   MRN: [id number]           Reason for Appointment: Follow-up for Type 2 Diabetes  Today's office visit was provided via telemedicine using video technique Explained to the patient and the the limitations of evaluation and management by telemedicine and the availability of in person appointments.  The patient understood the limitations and agreed to proceed. Patient also understood that the telehealth visit is billable. . Location of the patient: Home . Location of the provider: Office Only the patient and myself were participating in the encounter    Referring physician:  [name]   History of Present Illness:          Diagnosis: Type 2 diabetes mellitus, date of diagnosis: 2009       Past history: At diagnosis he was having symptoms of fatigue.  He was started on glipizide initially and subsequently metformin was added.  Further details are not available However he has been somewhat irregular with his follow-up and did not have an A1c with his PCP between 8/13 and 11/15 He was referred here because of rising A1c of 7.5 He had been started on Trulicity in 0/62 because of gradually increasing A1c He stopped taking Trulicity in 6948 because of out-of-pocket expense  Recent history:     Oral hypoglycemic drugs the patient is taking are: Trulicity 5.46 mg weekly, glipizide ER 5 mg, metformin 1 g twice a day, Actos 15 mg daily      His A1c which was persistently high is now 6.8, his best level has been 0.7 Highest level previously 9.1   Current blood sugar patterns, problems identified and management:  His blood sugars are much better with adding TRULICITY to his regimen  Also as a precaution lipids was reduced  He thinks that initially he had some nausea and abdominal discomfort with Trulicity but now this is improved  He is doing this regularly  Also he thinks he has lost weight but has not checked   Although his blood sugars are overall averaging about the same he is again checking his sugars mostly in the mornings and some around lunchtime  In the last few days he has had somewhat higher readings at times from eating large portions in the evenings and meats like barbecue  He thinks he is exercising with some activities at least 3 days a week with walking or biking  No hypoglycemia with lowest blood sugar 87        Side effects from medications have been: None Compliance with the medical regimen: Fair Hypoglycemia: Rarely    Glucose monitoring:  done usually one time a day         Glucometer:  [place]  Blood Glucose readings reviewed from monitor fax information  30-day average 135, previously 129 Before meal average 144 After meal average 139  PRE-MEAL Fasting Lunch Dinner Bedtime Overall  Glucose range:  114-155     87-293  Mean/median:      135   POST-MEAL PC Breakfast PC Lunch PC Dinner  Glucose range:    101, 103  Mean/median:         Self-care:  Meals: 3 meals per day. Breakfast is oatmeal/cereal/eggs and toast.  Has half sandwich or soup at lunch, avoiding rice and carbs usually       Exercise:  Occasionally walking or going on exercise bike  Dietician visit, most recent: at diagnosis  Weight history:   Wt Readings from Last 3 Encounters:  05/19/18 165 lb (74.8 kg)  04/24/18 164 lb 12.8 oz (74.8 kg)  03/25/18 167 lb 6.4 oz (75.9 kg)    Glycemic control:   Lab Results  Component Value Date   HGBA1C 6.8 (H) 07/23/2018   HGBA1C 8.1 (H) 05/15/2018   HGBA1C 9.1 (H) 02/27/2018   Lab Results  Component Value Date   MICROALBUR <0.7 02/27/2018   LDLCALC 165 (H) 05/15/2018   CREATININE 1.13 05/15/2018   Lab on 07/23/2018  Component Date Value Ref Range Status  . Vitamin B-12 07/23/2018 575  211 - 911 pg/mL Final  . Direct LDL 07/23/2018 79.0  mg/dL Final   Optimal:  <100 mg/dLNear or Above Optimal:  100-129 mg/dLBorderline High:  130-159  mg/dLHigh:  160-189 mg/dLVery High:  >190 mg/dL  . Glucose, Bld 07/23/2018 130* 70 - 99 mg/dL Final  . Hgb A1c MFr Bld 07/23/2018 6.8* 4.6 - 6.5 % Final   Glycemic Control Guidelines for People with Diabetes:Non Diabetic:  <6%Goal of Therapy: <7%Additional Action Suggested:  >8%      Allergies as of 07/25/2018   No Known Allergies     Medication List       Accurate as of Jul 24, 2018  8:56 PM. If you have any questions, ask your nurse or doctor.        aspirin 81 MG tablet Take 81 mg by mouth daily.   atorvastatin 20 MG tablet Commonly known as:  LIPITOR TAKE 1 TABLET (20 MG TOTAL) BY MOUTH DAILY.   Dulaglutide 0.75 MG/0.5ML Sopn Commonly known as:  Trulicity Inject in the abdominal skin as directed once a week   glipiZIDE 5 MG 24 hr tablet Commonly known as:  GLUCOTROL XL Take 1 tablet (5 mg total) by mouth daily.   ipratropium 0.06 % nasal spray Commonly known as:  Atrovent Place 2 sprays into both nostrils 4 (four) times daily.   levothyroxine 100 MCG tablet Commonly known as:  SYNTHROID TAKE 1 TABLET BY MOUTH EVERY DAY   losartan 100 MG tablet Commonly known as:  COZAAR TAKE 1 TABLET BY MOUTH ONCE DAILY   metFORMIN 1000 MG tablet Commonly known as:  GLUCOPHAGE TAKE 1 TAB BY MOUTH TWICE DAILY.PATIENT NEEDS OFFICE VISIT BEFORE REFILLS WILL BE GIVEN   metoprolol tartrate 100 MG tablet Commonly known as:  LOPRESSOR Take 1 tablet 2 hours prior to Cardiac CT   pioglitazone 15 MG tablet Commonly known as:  Actos Take 2 tablets (30 mg total) by mouth daily.   valACYclovir 500 MG tablet Commonly known as:  VALTREX TAKE 1 TABLET (500 MG TOTAL) BY MOUTH DAILY.       Allergies: No Known Allergies  Past Medical History:  Diagnosis Date  . ARTHRITIS, HIP 08/10/2008  . BURSITIS, RIGHT SHOULDER 05/20/2008  . Degeneration of lumbar or lumbosacral intervertebral disc 11/27/2006  . DEGENERATION, CERVICAL DISC 11/27/2006  . Degenerative disc disease, lumbar   .  Depression   . DEPRESSION 11/25/2006  . Diabetes mellitus   . DIABETES MELLITUS, TYPE II 04/10/2007  . Epicondylitis    bilateral  . Fatigue   . HERPES GENITALIS 09/17/2007  . Hyperlipemia   . HYPERLIPIDEMIA 03/31/2007  . Hypertension   . HYPERTENSION 11/25/2006  . HYPOTHYROIDISM 10/28/2007  . Impaired fasting glucose 12/26/2006  . Lateral epicondylitis  of elbow 05/05/2007  . Sciatica 10/28/2007  . SHOULDER PAIN, RIGHT 08/18/2008    Past Surgical History:  Procedure Laterality Date  .  ACHILLES TENDON REPAIR     left  . ELBOW SURGERY  2005   left  . herniated lumbar disc  1997, 2011  . KNEE ARTHROSCOPY  2007   left  . ROTATOR CUFF REPAIR  1997   right    Family History  Problem Relation Age of Onset  . Diabetes Mother   . Heart disease Mother   . Heart disease Father   . Diabetes Sister   . Colon cancer Neg Hx     Social History:  reports that he has never smoked. He has never used smokeless tobacco. He reports current alcohol use of about 3.0 standard drinks of alcohol per week. He reports that he does not use drugs.    Review of Systems         Lipids: He has been treated with Lipitor 20 mg  Previously was forgetting to take his atorvastatin but now he is taking it regularly with LDL 79  He has been treated by his PCP      Lab Results  Component Value Date   CHOL 232 (H) 05/15/2018   HDL 49.20 05/15/2018   LDLCALC 165 (H) 05/15/2018   LDLDIRECT 79.0 07/23/2018   TRIG 91.0 05/15/2018   CHOLHDL 5 05/15/2018                   Thyroid: He has been on Synthroid for several years years, initially had symptoms of fatigue at diagnosis He is on 100 mcg  This is followed by PCP  Lab Results  Component Value Date   TSH 3.72 02/27/2018   TSH 3.62 02/25/2017   TSH 2.13 02/13/2016     No symptoms of neuropathy in the past   Followed by pulmonologist for pulmonary fibrosis  LABS:  Lab on 07/23/2018  Component Date Value Ref Range Status  . Vitamin B-12  07/23/2018 575  211 - 911 pg/mL Final  . Direct LDL 07/23/2018 79.0  mg/dL Final   Optimal:  <100 mg/dLNear or Above Optimal:  100-129 mg/dLBorderline High:  130-159 mg/dLHigh:  160-189 mg/dLVery High:  >190 mg/dL  . Glucose, Bld 07/23/2018 130* 70 - 99 mg/dL Final  . Hgb A1c MFr Bld 07/23/2018 6.8* 4.6 - 6.5 % Final   Glycemic Control Guidelines for People with Diabetes:Non Diabetic:  <6%Goal of Therapy: <7%Additional Action Suggested:  >8%     Physical Examination:  There were no vitals taken for this visit.             ASSESSMENT:  Diabetes type 2, nonobese.   See history of present illness for detailed discussion of his current management, blood sugar patterns and problems identified  A1c is significantly better at 6.8  He is benefiting from Trulicity This is also helped him watch his diet better and he may be losing weight However his diet recently is still somewhat variable and can do better He is also on reduced dose of glipizide ER since his last visit which has prevented hypoglycemia   LIPIDS: Better controlled with improved compliance with Lipitor and will continue  PLAN:   He will check readings after meals more since he is not doing these regularly especially after dinner  Since he has readings as low as 87 will not increase glipizide  Also since A1c is improved will not consider increasing his Trulicity, he is having still some mild GI side effects but likely when he is overeating  More regular exercise  Follow-up in 3 months and call if  he has any unusual blood sugar patterns     There are no Patient Instructions on file for this visit.  Elayne Snare 07/24/2018, 8:56 PM   Note: This office note was prepared with Dragon voice recognition system technology. Any transcriptional errors that result from this process are unintentional.

## 2018-07-25 ENCOUNTER — Ambulatory Visit (INDEPENDENT_AMBULATORY_CARE_PROVIDER_SITE_OTHER): Payer: PRIVATE HEALTH INSURANCE | Admitting: Endocrinology

## 2018-07-25 ENCOUNTER — Encounter: Payer: Self-pay | Admitting: Endocrinology

## 2018-07-25 ENCOUNTER — Other Ambulatory Visit: Payer: Self-pay

## 2018-07-25 ENCOUNTER — Ambulatory Visit: Payer: PRIVATE HEALTH INSURANCE | Admitting: Endocrinology

## 2018-07-25 DIAGNOSIS — E063 Autoimmune thyroiditis: Secondary | ICD-10-CM

## 2018-07-25 DIAGNOSIS — E782 Mixed hyperlipidemia: Secondary | ICD-10-CM | POA: Diagnosis not present

## 2018-07-25 DIAGNOSIS — E1165 Type 2 diabetes mellitus with hyperglycemia: Secondary | ICD-10-CM

## 2018-07-31 ENCOUNTER — Other Ambulatory Visit: Payer: Self-pay | Admitting: Internal Medicine

## 2018-08-02 ENCOUNTER — Other Ambulatory Visit: Payer: Self-pay | Admitting: Internal Medicine

## 2018-08-16 ENCOUNTER — Other Ambulatory Visit: Payer: Self-pay | Admitting: Endocrinology

## 2018-08-19 DIAGNOSIS — A6001 Herpesviral infection of penis: Secondary | ICD-10-CM | POA: Insufficient documentation

## 2018-09-02 ENCOUNTER — Other Ambulatory Visit: Payer: Self-pay | Admitting: Internal Medicine

## 2018-11-07 ENCOUNTER — Other Ambulatory Visit: Payer: PRIVATE HEALTH INSURANCE

## 2018-11-10 ENCOUNTER — Ambulatory Visit: Payer: PRIVATE HEALTH INSURANCE | Admitting: Endocrinology

## 2018-11-21 ENCOUNTER — Other Ambulatory Visit: Payer: Self-pay | Admitting: Internal Medicine

## 2018-11-28 ENCOUNTER — Other Ambulatory Visit: Payer: PRIVATE HEALTH INSURANCE

## 2018-12-02 ENCOUNTER — Ambulatory Visit: Payer: PRIVATE HEALTH INSURANCE | Admitting: Endocrinology

## 2018-12-04 ENCOUNTER — Other Ambulatory Visit (INDEPENDENT_AMBULATORY_CARE_PROVIDER_SITE_OTHER): Payer: PRIVATE HEALTH INSURANCE

## 2018-12-04 ENCOUNTER — Other Ambulatory Visit: Payer: Self-pay

## 2018-12-04 DIAGNOSIS — E1165 Type 2 diabetes mellitus with hyperglycemia: Secondary | ICD-10-CM | POA: Diagnosis not present

## 2018-12-04 DIAGNOSIS — E063 Autoimmune thyroiditis: Secondary | ICD-10-CM | POA: Diagnosis not present

## 2018-12-04 LAB — COMPREHENSIVE METABOLIC PANEL
ALT: 20 U/L (ref 0–53)
AST: 15 U/L (ref 0–37)
Albumin: 4.3 g/dL (ref 3.5–5.2)
Alkaline Phosphatase: 31 U/L — ABNORMAL LOW (ref 39–117)
BUN: 20 mg/dL (ref 6–23)
CO2: 31 mEq/L (ref 19–32)
Calcium: 9.7 mg/dL (ref 8.4–10.5)
Chloride: 102 mEq/L (ref 96–112)
Creatinine, Ser: 1.14 mg/dL (ref 0.40–1.50)
GFR: 64.11 mL/min (ref >60.00)
Glucose, Bld: 151 mg/dL — ABNORMAL HIGH (ref 70–99)
Potassium: 4.7 mEq/L (ref 3.5–5.1)
Sodium: 139 mEq/L (ref 135–145)
Total Bilirubin: 0.8 mg/dL (ref 0.2–1.2)
Total Protein: 6.7 g/dL (ref 6.0–8.3)

## 2018-12-04 LAB — HEMOGLOBIN A1C: Hgb A1c MFr Bld: 7.3 % — ABNORMAL HIGH (ref 4.6–6.5)

## 2018-12-04 LAB — TSH: TSH: 4.11 u[IU]/mL (ref 0.35–4.50)

## 2018-12-08 NOTE — Progress Notes (Signed)
Patient ID: Ricardo [name], male   DOB: 1950-08-24, 68 y.o.   MRN: [id number]           Reason for Appointment: Follow-up for Type 2 Diabetes   Referring physician:  [name]   History of Present Illness:          Diagnosis: Type 2 diabetes mellitus, date of diagnosis: 2009       Past history: At diagnosis he was having symptoms of fatigue.  He was started on glipizide initially and subsequently metformin was added.  Further details are not available However he has been somewhat irregular with his follow-up and did not have an A1c with his PCP between 8/13 and 11/15 He was referred here because of rising A1c of 7.5 He had been started on Trulicity in [id number] because of gradually increasing A1c He stopped taking Trulicity in [id number] because of out-of-pocket expense  Recent history:     Oral hypoglycemic drugs the patient is taking are: Trulicity A999333 mg weekly, glipizide ER 5 mg, metformin 1 g twice a day, Actos 15 mg daily     His A1c which was 6.8 is now somewhat higher at 7.3  Highest level previously 9.1   Current blood sugar patterns, problems identified and management:  His blood sugars documented to be higher  He states that he was traveling for 3 weeks and did not take any of his medications with him  He started back on his Trulicity last Sunday and he thinks that it may be causing some bloating and upper abdominal discomfort now  Previously had transient nausea but he had become accustomed to this  Also had taken Trulicity in the past in 2017 also without difficulties  He is trying to do some exercise with walking or biking  His weight is still down compared to spring this year  He has had blood sugars as high as 190 fasting but he thinks this is when he eats late  He is not able to consistently control portions of any snacks as well as some sweets like cake; he said that his wife has snacks lying around  He again wants to try and reduce his medications  As  usual difficult to analyze his blood sugar readings since his meter does not organize blood sugars by time of day        Side effects from medications have been: None Compliance with the medical regimen: Fair   Glucose monitoring:  done usually one time a day         Glucometer:  [place]  Blood Glucose readings reviewed from monitor fax information   PRE-MEAL Fasting Lunch Dinner Bedtime Overall  Glucose range:  125-170      Mean/median:      14 0    Summary data: Before meal average 133 After meal average 132, was 139 Other blood sugar average 155 for 30 days   Self-care:  Meals: 3 meals per day. Breakfast is oatmeal/cereal/eggs and toast.  Has half sandwich or soup at lunch, avoiding rice and carbs usually       Exercise:  Some walking or going on exercise bike  Dietician visit, most recent: at diagnosis               Weight history:   Wt Readings from Last 3 Encounters:  12/09/18 160 lb 12.8 oz (72.9 kg)  05/19/18 165 lb (74.8 kg)  04/24/18 164 lb 12.8 oz (74.8 kg)    Glycemic control:   Lab Results  Component Value Date   HGBA1C 7.3 (H) 12/04/2018   HGBA1C 6.8 (H) 07/23/2018   HGBA1C 8.1 (H) 05/15/2018   Lab Results  Component Value Date   MICROALBUR <0.7 02/27/2018   LDLCALC 165 (H) 05/15/2018   CREATININE 1.14 12/04/2018   Lab on 12/04/2018  Component Date Value Ref Range Status  . TSH 12/04/2018 4.11  0.35 - 4.50 uIU/mL Final  . Sodium 12/04/2018 139  135 - 145 mEq/L Final  . Potassium 12/04/2018 4.7  3.5 - 5.1 mEq/L Final  . Chloride 12/04/2018 102  96 - 112 mEq/L Final  . CO2 12/04/2018 31  19 - 32 mEq/L Final  . Glucose, Bld 12/04/2018 151* 70 - 99 mg/dL Final  . BUN 12/04/2018 20  6 - 23 mg/dL Final  . Creatinine, Ser 12/04/2018 1.14  0.40 - 1.50 mg/dL Final  . Total Bilirubin 12/04/2018 0.8  0.2 - 1.2 mg/dL Final  . Alkaline Phosphatase 12/04/2018 31* 39 - 117 U/L Final  . AST 12/04/2018 15  0 - 37 U/L Final  . ALT 12/04/2018 20  0 - 53 U/L  Final  . Total Protein 12/04/2018 6.7  6.0 - 8.3 g/dL Final  . Albumin 12/04/2018 4.3  3.5 - 5.2 g/dL Final  . Calcium 12/04/2018 9.7  8.4 - 10.5 mg/dL Final  . GFR 12/04/2018 64.11  >60.00 mL/min Final  . Hgb A1c MFr Bld 12/04/2018 7.3* 4.6 - 6.5 % Final   Glycemic Control Guidelines for People with Diabetes:Non Diabetic:  <6%Goal of Therapy: <7%Additional Action Suggested:  >8%      Allergies as of 12/09/2018   No Known Allergies     Medication List       Accurate as of December 09, 2018  8:38 AM. If you have any questions, ask your nurse or doctor.        aspirin 81 MG tablet Take 81 mg by mouth daily.   atorvastatin 20 MG tablet Commonly known as: LIPITOR TAKE 1 TABLET (20 MG TOTAL) BY MOUTH DAILY.   Dulaglutide 0.75 MG/0.5ML Sopn Commonly known as: Trulicity Inject in the abdominal skin as directed once a week   glipiZIDE 5 MG 24 hr tablet Commonly known as: GLUCOTROL XL TAKE 1 TABLET BY MOUTH EVERY DAY   ipratropium 0.06 % nasal spray Commonly known as: Atrovent Place 2 sprays into both nostrils 4 (four) times daily.   levothyroxine 100 MCG tablet Commonly known as: SYNTHROID TAKE 1 TABLET BY MOUTH EVERY DAY   losartan 100 MG tablet Commonly known as: COZAAR TAKE 1 TABLET BY MOUTH ONCE DAILY   metFORMIN 1000 MG tablet Commonly known as: GLUCOPHAGE TAKE 1 TABLET BY MOUTH TWICE A DAY   metoprolol tartrate 100 MG tablet Commonly known as: LOPRESSOR Take 1 tablet 2 hours prior to Cardiac CT   pioglitazone 15 MG tablet Commonly known as: Actos Take 2 tablets (30 mg total) by mouth daily. What changed: how much to take   valACYclovir 500 MG tablet Commonly known as: VALTREX TAKE 1 TABLET (500 MG TOTAL) BY MOUTH DAILY.       Allergies: No Known Allergies  Past Medical History:  Diagnosis Date  . ARTHRITIS, HIP 08/10/2008  . BURSITIS, RIGHT SHOULDER 05/20/2008  . Degeneration of lumbar or lumbosacral intervertebral disc 11/27/2006  . DEGENERATION,  CERVICAL DISC 11/27/2006  . Degenerative disc disease, lumbar   . Depression   . DEPRESSION 11/25/2006  . Diabetes mellitus   . DIABETES MELLITUS, TYPE II 04/10/2007  . Epicondylitis  bilateral  . Fatigue   . HERPES GENITALIS 09/17/2007  . Hyperlipemia   . HYPERLIPIDEMIA 03/31/2007  . Hypertension   . HYPERTENSION 11/25/2006  . HYPOTHYROIDISM 10/28/2007  . Impaired fasting glucose 12/26/2006  . Lateral epicondylitis  of elbow 05/05/2007  . Sciatica 10/28/2007  . SHOULDER PAIN, RIGHT 08/18/2008    Past Surgical History:  Procedure Laterality Date  . ACHILLES TENDON REPAIR     left  . ELBOW SURGERY  2005   left  . herniated lumbar disc  1997, 2011  . KNEE ARTHROSCOPY  2007   left  . ROTATOR CUFF REPAIR  1997   right    Family History  Problem Relation Age of Onset  . Diabetes Mother   . Heart disease Mother   . Heart disease Father   . Diabetes Sister   . Colon cancer Neg Hx     Social History:  reports that he has never smoked. He has never used smokeless tobacco. He reports current alcohol use of about 3.0 standard drinks of alcohol per week. He reports that he does not use drugs.    Review of Systems         Lipids: He has been treated with Lipitor 20 mg  Previously was forgetting to take his atorvastatin but now he is taking it regularly with last LDL 79  He has been treated by his PCP      Lab Results  Component Value Date   CHOL 232 (H) 05/15/2018   HDL 49.20 05/15/2018   LDLCALC 165 (H) 05/15/2018   LDLDIRECT 79.0 07/23/2018   TRIG 91.0 05/15/2018   CHOLHDL 5 05/15/2018                   Thyroid: He has been on Synthroid for several years years, initially had symptoms of fatigue at diagnosis He is on 100 mcg  This is followed by PCP He says that recently was out of his supplement for 3 weeks but his TSH is not significantly high  Lab Results  Component Value Date   TSH 4.11 12/04/2018   TSH 3.72 02/27/2018   TSH 3.62 02/25/2017     No symptoms  of neuropathy   Followed by pulmonologist for pulmonary fibrosis  LABS:  Lab on 12/04/2018  Component Date Value Ref Range Status  . TSH 12/04/2018 4.11  0.35 - 4.50 uIU/mL Final  . Sodium 12/04/2018 139  135 - 145 mEq/L Final  . Potassium 12/04/2018 4.7  3.5 - 5.1 mEq/L Final  . Chloride 12/04/2018 102  96 - 112 mEq/L Final  . CO2 12/04/2018 31  19 - 32 mEq/L Final  . Glucose, Bld 12/04/2018 151* 70 - 99 mg/dL Final  . BUN 12/04/2018 20  6 - 23 mg/dL Final  . Creatinine, Ser 12/04/2018 1.14  0.40 - 1.50 mg/dL Final  . Total Bilirubin 12/04/2018 0.8  0.2 - 1.2 mg/dL Final  . Alkaline Phosphatase 12/04/2018 31* 39 - 117 U/L Final  . AST 12/04/2018 15  0 - 37 U/L Final  . ALT 12/04/2018 20  0 - 53 U/L Final  . Total Protein 12/04/2018 6.7  6.0 - 8.3 g/dL Final  . Albumin 12/04/2018 4.3  3.5 - 5.2 g/dL Final  . Calcium 12/04/2018 9.7  8.4 - 10.5 mg/dL Final  . GFR 12/04/2018 64.11  >60.00 mL/min Final  . Hgb A1c MFr Bld 12/04/2018 7.3* 4.6 - 6.5 % Final   Glycemic Control Guidelines for People with Diabetes:Non Diabetic:  <  6%Goal of Therapy: <7%Additional Action Suggested:  >8%     Physical Examination:  BP 130/70 (BP Location: Left Arm, Patient Position: Sitting, Cuff Size: Normal)   Pulse 76   Ht 5\' 6"  (1.676 m)   Wt 160 lb 12.8 oz (72.9 kg)   SpO2 97%   BMI 25.95 kg/m              ASSESSMENT:  Diabetes type 2, nonobese.   See history of present illness for detailed discussion of his current management, blood sugar patterns and problems identified  A1c is Slightly higher at 7.3, previously was at 6.8  He has benefited from Trulicity but he is considering possible abdominal distress with this as a side effect However he only has this recently when he restarted the medication after 3-week interval As before is reluctant to add more medications especially brand-name However discussed that he is having progression of his diabetes and likely will not get controlled with  his old regimen of Metformin and glipizide   Hypertension: Controlled  Hypothyroidism: His TSH is high normal but this is likely to be from his not taking his medications last month  He refuses flu vaccine today  PLAN:   He was given the option of switching to [place] instead of Trulicity but he wants to try 1 more injection of Trulicity to see if he can get used to it Discussed action of SGLT 2 drugs on lowering glucose by decreasing kidney absorption of glucose, benefits of weight loss and lower blood pressure, possible side effects, need to reduce blood pressure medication and dosage regimen   We will try to be consistent with diet and exercise  Continue monitoring blood sugars at various times and follow-up in 3 months     There are no Patient Instructions on file for this visit.  Elayne Snare 12/09/2018, 8:38 AM   Note: This office note was prepared with Dragon voice recognition system technology. Any transcriptional errors that result from this process are unintentional.

## 2018-12-09 ENCOUNTER — Other Ambulatory Visit: Payer: Self-pay

## 2018-12-09 ENCOUNTER — Encounter: Payer: Self-pay | Admitting: Endocrinology

## 2018-12-09 ENCOUNTER — Telehealth: Payer: Self-pay | Admitting: Endocrinology

## 2018-12-09 ENCOUNTER — Ambulatory Visit (INDEPENDENT_AMBULATORY_CARE_PROVIDER_SITE_OTHER): Payer: PRIVATE HEALTH INSURANCE | Admitting: Endocrinology

## 2018-12-09 VITALS — BP 130/70 | HR 76 | Ht 66.0 in | Wt 160.8 lb

## 2018-12-09 DIAGNOSIS — I10 Essential (primary) hypertension: Secondary | ICD-10-CM | POA: Diagnosis not present

## 2018-12-09 DIAGNOSIS — E1165 Type 2 diabetes mellitus with hyperglycemia: Secondary | ICD-10-CM

## 2018-12-09 NOTE — Telephone Encounter (Signed)
Patient requests his lab results be posted to his MyChart

## 2018-12-11 ENCOUNTER — Other Ambulatory Visit: Payer: Self-pay | Admitting: Endocrinology

## 2019-02-08 ENCOUNTER — Other Ambulatory Visit: Payer: Self-pay | Admitting: Internal Medicine

## 2019-03-02 ENCOUNTER — Encounter: Payer: PRIVATE HEALTH INSURANCE | Admitting: Internal Medicine

## 2019-03-10 ENCOUNTER — Other Ambulatory Visit: Payer: PRIVATE HEALTH INSURANCE

## 2019-03-11 ENCOUNTER — Other Ambulatory Visit: Payer: PRIVATE HEALTH INSURANCE

## 2019-03-12 ENCOUNTER — Encounter: Payer: Self-pay | Admitting: Gastroenterology

## 2019-03-12 ENCOUNTER — Other Ambulatory Visit: Payer: Self-pay

## 2019-03-12 ENCOUNTER — Ambulatory Visit (INDEPENDENT_AMBULATORY_CARE_PROVIDER_SITE_OTHER): Payer: PRIVATE HEALTH INSURANCE | Admitting: Internal Medicine

## 2019-03-12 ENCOUNTER — Encounter: Payer: Self-pay | Admitting: Internal Medicine

## 2019-03-12 VITALS — BP 126/70 | HR 68 | Temp 98.4°F | Ht 66.0 in | Wt 165.0 lb

## 2019-03-12 DIAGNOSIS — Z0000 Encounter for general adult medical examination without abnormal findings: Secondary | ICD-10-CM | POA: Diagnosis not present

## 2019-03-12 DIAGNOSIS — Z1211 Encounter for screening for malignant neoplasm of colon: Secondary | ICD-10-CM | POA: Diagnosis not present

## 2019-03-12 DIAGNOSIS — N320 Bladder-neck obstruction: Secondary | ICD-10-CM | POA: Diagnosis not present

## 2019-03-12 DIAGNOSIS — E785 Hyperlipidemia, unspecified: Secondary | ICD-10-CM | POA: Diagnosis not present

## 2019-03-12 DIAGNOSIS — I10 Essential (primary) hypertension: Secondary | ICD-10-CM

## 2019-03-12 DIAGNOSIS — E119 Type 2 diabetes mellitus without complications: Secondary | ICD-10-CM

## 2019-03-12 LAB — TSH: TSH: 3.21 u[IU]/mL (ref 0.35–4.50)

## 2019-03-12 LAB — LIPID PANEL
Cholesterol: 205 mg/dL — ABNORMAL HIGH (ref 0–200)
HDL: 54.3 mg/dL (ref >39.00)
LDL Cholesterol: 131 mg/dL — ABNORMAL HIGH (ref 0–99)
NonHDL: 150.88
Total CHOL/HDL Ratio: 4
Triglycerides: 97 mg/dL (ref 0.0–149.0)
VLDL: 19.4 mg/dL (ref 0.0–40.0)

## 2019-03-12 LAB — URINALYSIS
Bilirubin Urine: NEGATIVE
Hgb urine dipstick: NEGATIVE
Ketones, ur: NEGATIVE
Leukocytes,Ua: NEGATIVE
Nitrite: NEGATIVE
Specific Gravity, Urine: 1.015 (ref 1.000–1.030)
Total Protein, Urine: NEGATIVE
Urine Glucose: NEGATIVE
Urobilinogen, UA: 0.2 (ref 0.0–1.0)
pH: 6 (ref 5.0–8.0)

## 2019-03-12 LAB — HEPATIC FUNCTION PANEL
ALT: 18 U/L (ref 0–53)
AST: 15 U/L (ref 0–37)
Albumin: 4.5 g/dL (ref 3.5–5.2)
Alkaline Phosphatase: 33 U/L — ABNORMAL LOW (ref 39–117)
Bilirubin, Direct: 0.1 mg/dL (ref 0.0–0.3)
Total Bilirubin: 0.6 mg/dL (ref 0.2–1.2)
Total Protein: 7 g/dL (ref 6.0–8.3)

## 2019-03-12 LAB — CBC WITH DIFFERENTIAL/PLATELET
Basophils Absolute: 0.1 10*3/uL (ref 0.0–0.1)
Basophils Relative: 1 % (ref 0.0–3.0)
Eosinophils Absolute: 0.3 10*3/uL (ref 0.0–0.7)
Eosinophils Relative: 4.1 % (ref 0.0–5.0)
HCT: 43.2 % (ref 39.0–52.0)
Hemoglobin: 14.2 g/dL (ref 13.0–17.0)
Lymphocytes Relative: 23.2 % (ref 12.0–46.0)
Lymphs Abs: 1.5 10*3/uL (ref 0.7–4.0)
MCHC: 32.9 g/dL (ref 30.0–36.0)
MCV: 91.6 fl (ref 78.0–100.0)
Monocytes Absolute: 0.5 10*3/uL (ref 0.1–1.0)
Monocytes Relative: 8.4 % (ref 3.0–12.0)
Neutro Abs: 4.2 10*3/uL (ref 1.4–7.7)
Neutrophils Relative %: 63.3 % (ref 43.0–77.0)
Platelets: 220 10*3/uL (ref 150.0–400.0)
RBC: 4.71 Mil/uL (ref 4.22–5.81)
RDW: 12.8 % (ref 11.5–15.5)
WBC: 6.6 10*3/uL (ref 4.0–10.5)

## 2019-03-12 LAB — BASIC METABOLIC PANEL
BUN: 20 mg/dL (ref 6–23)
CO2: 32 mEq/L (ref 19–32)
Calcium: 9.8 mg/dL (ref 8.4–10.5)
Chloride: 101 mEq/L (ref 96–112)
Creatinine, Ser: 1.18 mg/dL (ref 0.40–1.50)
GFR: 61.56 mL/min (ref >60.00)
Glucose, Bld: 130 mg/dL — ABNORMAL HIGH (ref 70–99)
Potassium: 4.6 mEq/L (ref 3.5–5.1)
Sodium: 138 mEq/L (ref 135–145)

## 2019-03-12 LAB — PSA: PSA: 1.9 ng/mL (ref 0.10–4.00)

## 2019-03-12 NOTE — Assessment & Plan Note (Signed)
Losartan, ASA

## 2019-03-12 NOTE — Progress Notes (Signed)
Subjective:  Patient ID: Ricardo [name], male    DOB: 01-27-1951  Age: 69 y.o. MRN: [id number]  CC: No chief complaint on file.   HPI Ricardo [name] presents for hypothyroidism, DM, dyslipidemia. Well exam   Outpatient Medications Prior to Visit  Medication Sig Dispense Refill   aspirin 81 MG tablet Take 81 mg by mouth daily.       atorvastatin (LIPITOR) 20 MG tablet TAKE 1 TABLET (20 MG TOTAL) BY MOUTH DAILY. 90 tablet 3   Dulaglutide (TRULICITY) A999333 0000000 SOPN Inject in the abdominal skin as directed once a week 12 pen 2   glipiZIDE (GLUCOTROL XL) 5 MG 24 hr tablet TAKE 1 TABLET BY MOUTH EVERY DAY 90 tablet 0   ipratropium (ATROVENT) 0.06 % nasal spray Place 2 sprays into both nostrils 4 (four) times daily. 15 mL 0   levothyroxine (SYNTHROID) 100 MCG tablet TAKE 1 TABLET BY MOUTH EVERY DAY 90 tablet 1   losartan (COZAAR) 100 MG tablet TAKE 1 TABLET BY MOUTH ONCE DAILY 90 tablet 1   metFORMIN (GLUCOPHAGE) 1000 MG tablet TAKE 1 TABLET BY MOUTH TWICE A DAY 180 tablet 3   metoprolol tartrate (LOPRESSOR) 100 MG tablet Take 1 tablet 2 hours prior to Cardiac CT 1 tablet 0   pioglitazone (ACTOS) 15 MG tablet Take 2 tablets (30 mg total) by mouth daily. (Patient taking differently: Take 15 mg by mouth daily. ) 180 tablet 2   valACYclovir (VALTREX) 500 MG tablet TAKE 1 TABLET (500 MG TOTAL) BY MOUTH DAILY. 90 tablet 2   No facility-administered medications prior to visit.    ROS: Review of Systems  Constitutional: Positive for fatigue. Negative for appetite change and unexpected weight change.  HENT: Negative for congestion, nosebleeds, sneezing, sore throat and trouble swallowing.   Eyes: Negative for itching and visual disturbance.  Respiratory: Negative for cough.   Cardiovascular: Negative for chest pain, palpitations and leg swelling.  Gastrointestinal: Negative for abdominal distention, blood in stool, diarrhea and nausea.  Genitourinary: Negative for  frequency and hematuria.  Musculoskeletal: Negative for back pain, gait problem, joint swelling and neck pain.  Skin: Negative for rash.  Neurological: Negative for dizziness, tremors, speech difficulty and weakness.  Psychiatric/Behavioral: Negative for agitation, dysphoric mood, sleep disturbance and suicidal ideas. The patient is not nervous/anxious.     Objective:  BP 126/70 (BP Location: Left Arm, Patient Position: Sitting, Cuff Size: Large)    Pulse 68    Temp 98.4 F (36.9 C) (Oral)    Ht 5\' 6"  (1.676 m)    Wt 165 lb (74.8 kg)    SpO2 98%    BMI 26.63 kg/m   BP Readings from Last 3 Encounters:  03/12/19 126/70  12/09/18 130/70  05/19/18 126/70    Wt Readings from Last 3 Encounters:  03/12/19 165 lb (74.8 kg)  12/09/18 160 lb 12.8 oz (72.9 kg)  05/19/18 165 lb (74.8 kg)    Physical Exam Constitutional:      General: He is not in acute distress.    Appearance: He is well-developed. He is not diaphoretic.     Comments: NAD  HENT:     Head: Normocephalic and atraumatic.     Right Ear: External ear normal.     Left Ear: External ear normal.     Nose: Nose normal.     Mouth/Throat:     Pharynx: No oropharyngeal exudate.  Eyes:     General: No scleral icterus.  Right eye: No discharge.        Left eye: No discharge.     Conjunctiva/sclera: Conjunctivae normal.     Pupils: Pupils are equal, round, and reactive to light.  Neck:     Thyroid: No thyromegaly.     Vascular: No JVD.     Trachea: No tracheal deviation.  Cardiovascular:     Rate and Rhythm: Normal rate and regular rhythm.     Heart sounds: Normal heart sounds. No murmur. No friction rub. No gallop.   Pulmonary:     Effort: Pulmonary effort is normal. No respiratory distress.     Breath sounds: Normal breath sounds. No stridor. No wheezing or rales.  Chest:     Chest wall: No tenderness.  Abdominal:     General: Bowel sounds are normal. There is no distension.     Palpations: Abdomen is soft. There  is no mass.     Tenderness: There is no abdominal tenderness. There is no guarding or rebound.  Genitourinary:    Penis: Normal. No tenderness.      Prostate: Normal.     Rectum: Normal. Guaiac result negative.  Musculoskeletal:        General: No tenderness. Normal range of motion.     Cervical back: Normal range of motion and neck supple.  Lymphadenopathy:     Cervical: No cervical adenopathy.  Skin:    General: Skin is warm and dry.     Coloration: Skin is not pale.     Findings: No erythema or rash.  Neurological:     Mental Status: He is alert and oriented to person, place, and time.     Cranial Nerves: No cranial nerve deficit.     Motor: No abnormal muscle tone.     Coordination: Coordination normal.     Gait: Gait normal.     Deep Tendon Reflexes: Reflexes are normal and symmetric. Reflexes normal.  Psychiatric:        Behavior: Behavior normal.        Thought Content: Thought content normal.        Judgment: Judgment normal.    Rectal - per GI   Lab Results  Component Value Date   WBC 7.4 02/27/2018   HGB 14.6 02/27/2018   HCT 43.7 02/27/2018   PLT 215.0 02/27/2018   GLUCOSE 151 (H) 12/04/2018   CHOL 232 (H) 05/15/2018   TRIG 91.0 05/15/2018   HDL 49.20 05/15/2018   LDLDIRECT 79.0 07/23/2018   LDLCALC 165 (H) 05/15/2018   ALT 20 12/04/2018   AST 15 12/04/2018   NA 139 12/04/2018   K 4.7 12/04/2018   CL 102 12/04/2018   CREATININE 1.14 12/04/2018   BUN 20 12/04/2018   CO2 31 12/04/2018   TSH 4.11 12/04/2018   PSA 4.76 (H) 02/27/2018   INR 1.0 04/02/2007   HGBA1C 7.3 (H) 12/04/2018   MICROALBUR <0.7 02/27/2018    CT Chest High Resolution  Result Date: 05/07/2018 CLINICAL DATA:  Shortness of breath on exertion. A bronchitis in January. Persistent cough. EXAM: CT CHEST WITHOUT CONTRAST TECHNIQUE: Multidetector CT imaging of the chest was performed following the standard protocol without intravenous contrast. High resolution imaging of the lungs, as  well as inspiratory and expiratory imaging, was performed. COMPARISON:  02/18/2018, 04/01/2007 and 04/24/2006. FINDINGS: Cardiovascular: Atherosclerotic calcification of the aorta. Heart is at the upper limits of normal in size. No pericardial effusion. Mediastinum/Nodes: No pathologically enlarged mediastinal or axillary lymph nodes. Hilar regions are  difficult to evaluate without IV contrast. Esophagus is grossly unremarkable. Lungs/Pleura: Peripheral and basilar subpleural ground-glass and interstitial coarsening with probable mild traction bronchiolectasis. No honeycombing. Minimal subpleural reticular densities. Findings are stable to minimally progressive from 04/01/2007. Mild air trapping. No pleural fluid. Airway is unremarkable. Upper Abdomen: Visualized portions of the liver, adrenal glands and right kidney are unremarkable. 4.7 cm low-attenuation lesion in the left kidney is incompletely imaged but likely a cyst. Visualized portions of the spleen, pancreas, stomach and bowel are grossly unremarkable. No upper abdominal adenopathy. Musculoskeletal: Degenerative changes in the spine. No worrisome lytic or sclerotic lesions. IMPRESSION: 1. 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 Findings are indeterminate for UIP per consensus guidelines: Diagnosis of Idiopathic Pulmonary Fibrosis: An Official ATS/ERS/JRS/ALAT Clinical Practice Guideline. [place], Iss 5, [phone], Oct 27 2016. 2.  Aortic atherosclerosis (ICD10-170.0). Electronically Signed   By: Lorin Picket M.D.   On: 05/07/2018 15:08    Assessment & Plan:   There are no diagnoses linked to this encounter.   No orders of the defined types were placed in this encounter.    Follow-up: No follow-ups on file.  Walker Kehr, MD

## 2019-03-12 NOTE — Assessment & Plan Note (Signed)
On Lipitor, ASA 

## 2019-03-12 NOTE — Assessment & Plan Note (Signed)
Labs

## 2019-03-12 NOTE — Assessment & Plan Note (Signed)
We discussed age appropriate health related issues, including available/recomended screening tests and vaccinations. We discussed a need for adhering to healthy diet and exercise. Labs were ordered to be later reviewed . All questions were answered. Colon is due

## 2019-03-13 ENCOUNTER — Other Ambulatory Visit: Payer: PRIVATE HEALTH INSURANCE

## 2019-03-13 ENCOUNTER — Other Ambulatory Visit: Payer: Self-pay

## 2019-03-16 NOTE — Progress Notes (Signed)
Patient ID: Ricardo [name], male   DOB: 04/25/1950, 69 y.o.   MRN: [id number]           Reason for Appointment: Follow-up for Type 2 Diabetes   Referring physician:  [name]   History of Present Illness:          Diagnosis: Type 2 diabetes mellitus, date of diagnosis: 2009       Past history: At diagnosis he was having symptoms of fatigue.  He was started on glipizide initially and subsequently metformin was added.  Further details are not available However he has been somewhat irregular with his follow-up and did not have an A1c with his PCP between 8/13 and 11/15 He was referred here because of rising A1c of 7.5 He had been started on Trulicity in [id number] because of gradually increasing A1c He stopped taking Trulicity in 99991111 because of out-of-pocket expense  Recent history:     Oral hypoglycemic drugs : Trulicity [id number] mg weekly, glipizide ER 5 mg, metformin 1 g twice a day, Actos 15 mg daily     His A1c is about the same at 7.2, has been as low as 6.8 and highest 9.1  Current blood sugar patterns, problems identified and management:  On his last visit in 10/20 he was concerned about possible abdominal pain from starting Trulicity back  However he stopped having the symptom and has continued Trulicity, was also given an option of taking Farxiga  His weight fluctuates and is up about 4-5 pounds since his last visit  He thinks he has not been consistent with diet and sometimes eating desserts and sweets like cakes  He says he is taking his medications regularly although previously had been recommended 30 mg of Actos but he takes only 15 mg  As usual difficult to analyze his blood sugar readings since his meter does not organize blood sugars by time of day  Does have sporadic high readings at all times especially midday and evenings  Fasting glucose in the lab was 137 today at home was 122  He says he is trying to exercise at home and is able to use his exercise  equipment more regularly recently        Side effects from medications have been: None Compliance with the medical regimen: Fair   Glucose monitoring:  done usually one time a day         Glucometer:  [employer]  Blood Glucose readings reviewed from monitor meter fax information   PRE-MEAL  before meal   after meal  Overall  Glucose range:  86-215   79-178   79-221  Mean/median:  120   115   129    Previously:  PRE-MEAL Fasting Lunch Dinner Bedtime Overall  Glucose range:  125-170      Mean/median:      140   Summary data: Before meal average 133 After meal average 132, was 139 Other blood sugar average 155 for 30 days   Self-care:  Meals: 3 meals per day. Breakfast is oatmeal/cereal/eggs and toast.  Has half sandwich or soup at lunch, avoiding rice and carbs usually        Dietician visit, most recent: at diagnosis               Weight history:   Wt Readings from Last 3 Encounters:  03/17/19 164 lb 6.4 oz (74.6 kg)  03/12/19 165 lb (74.8 kg)  12/09/18 160 lb 12.8 oz (72.9 kg)    Glycemic  control:   Lab Results  Component Value Date   HGBA1C 7.2 (A) 03/17/2019   HGBA1C 7.3 (H) 12/04/2018   HGBA1C 6.8 (H) 07/23/2018   Lab Results  Component Value Date   MICROALBUR <0.7 02/27/2018   LDLCALC 131 (H) 03/12/2019   CREATININE 1.18 03/12/2019   Office Visit on 03/17/2019  Component Date Value Ref Range Status  . Hemoglobin A1C 03/17/2019 7.2* 4.0 - 5.6 % Final  Office Visit on 03/12/2019  Component Date Value Ref Range Status  . Total Bilirubin 03/12/2019 0.6  0.2 - 1.2 mg/dL Final  . Bilirubin, Direct 03/12/2019 0.1  0.0 - 0.3 mg/dL Final  . Alkaline Phosphatase 03/12/2019 33* 39 - 117 U/L Final  . AST 03/12/2019 15  0 - 37 U/L Final  . ALT 03/12/2019 18  0 - 53 U/L Final  . Total Protein 03/12/2019 7.0  6.0 - 8.3 g/dL Final  . Albumin 03/12/2019 4.5  3.5 - 5.2 g/dL Final  . Sodium 03/12/2019 138  135 - 145 mEq/L Final  . Potassium 03/12/2019 4.6  3.5 - 5.1  mEq/L Final  . Chloride 03/12/2019 101  96 - 112 mEq/L Final  . CO2 03/12/2019 32  19 - 32 mEq/L Final  . Glucose, Bld 03/12/2019 130* 70 - 99 mg/dL Final  . BUN 03/12/2019 20  6 - 23 mg/dL Final  . Creatinine, Ser 03/12/2019 1.18  0.40 - 1.50 mg/dL Final  . GFR 03/12/2019 61.56  >60.00 mL/min Final  . Calcium 03/12/2019 9.8  8.4 - 10.5 mg/dL Final  . PSA 03/12/2019 1.90  0.10 - 4.00 ng/mL Final   Test performed using Access Hybritech PSA Assay, a parmagnetic partical, chemiluminecent immunoassay.  . Cholesterol 03/12/2019 205* 0 - 200 mg/dL Final   ATP III Classification       Desirable:  < 200 mg/dL               Borderline High:  200 - 239 mg/dL          High:  > = 240 mg/dL  . Triglycerides 03/12/2019 97.0  0.0 - 149.0 mg/dL Final   Normal:  <150 mg/dLBorderline High:  150 - 199 mg/dL  . HDL 03/12/2019 54.30  >39.00 mg/dL Final  . VLDL 03/12/2019 19.4  0.0 - 40.0 mg/dL Final  . LDL Cholesterol 03/12/2019 131* 0 - 99 mg/dL Final  . Total CHOL/HDL Ratio 03/12/2019 4   Final                  Men          Women1/2 Average Risk     3.4          3.3Average Risk          5.0          4.42X Average Risk          9.6          7.13X Average Risk          15.0          11.0                      . NonHDL 03/12/2019 150.88   Final   NOTE:  Non-HDL goal should be 30 mg/dL higher than patient's LDL goal (i.e. LDL goal of < 70 mg/dL, would have non-HDL goal of < 100 mg/dL)  . WBC 03/12/2019 6.6  4.0 - 10.5 K/uL Final  . RBC 03/12/2019  4.71  4.22 - 5.81 Mil/uL Final  . Hemoglobin 03/12/2019 14.2  13.0 - 17.0 g/dL Final  . HCT 03/12/2019 43.2  39.0 - 52.0 % Final  . MCV 03/12/2019 91.6  78.0 - 100.0 fl Final  . MCHC 03/12/2019 32.9  30.0 - 36.0 g/dL Final  . RDW 03/12/2019 12.8  11.5 - 15.5 % Final  . Platelets 03/12/2019 220.0  150.0 - 400.0 K/uL Final  . Neutrophils Relative % 03/12/2019 63.3  43.0 - 77.0 % Final  . Lymphocytes Relative 03/12/2019 23.2  12.0 - 46.0 % Final  . Monocytes Relative  03/12/2019 8.4  3.0 - 12.0 % Final  . Eosinophils Relative 03/12/2019 4.1  0.0 - 5.0 % Final  . Basophils Relative 03/12/2019 1.0  0.0 - 3.0 % Final  . Neutro Abs 03/12/2019 4.2  1.4 - 7.7 K/uL Final  . Lymphs Abs 03/12/2019 1.5  0.7 - 4.0 K/uL Final  . Monocytes Absolute 03/12/2019 0.5  0.1 - 1.0 K/uL Final  . Eosinophils Absolute 03/12/2019 0.3  0.0 - 0.7 K/uL Final  . Basophils Absolute 03/12/2019 0.1  0.0 - 0.1 K/uL Final  . Color, Urine 03/12/2019 YELLOW  Yellow;Lt. Yellow;Straw;Dark Yellow;Amber;Green;Red;Brown Final  . APPearance 03/12/2019 CLEAR  Clear;Turbid;Slightly Cloudy;Cloudy Final  . Specific Gravity, Urine 03/12/2019 1.015  1.000 - 1.030 Final  . pH 03/12/2019 6.0  5.0 - 8.0 Final  . Total Protein, Urine 03/12/2019 NEGATIVE  Negative Final  . Urine Glucose 03/12/2019 NEGATIVE  Negative Final  . Ketones, ur 03/12/2019 NEGATIVE  Negative Final  . Bilirubin Urine 03/12/2019 NEGATIVE  Negative Final  . Hgb urine dipstick 03/12/2019 NEGATIVE  Negative Final  . Urobilinogen, UA 03/12/2019 0.2  0.0 - 1.0 Final  . Leukocytes,Ua 03/12/2019 NEGATIVE  Negative Final  . Nitrite 03/12/2019 NEGATIVE  Negative Final  . TSH 03/12/2019 3.21  0.35 - 4.50 uIU/mL Final     Allergies as of 03/17/2019   No Known Allergies     Medication List       Accurate as of March 17, 2019  9:44 AM. If you have any questions, ask your nurse or doctor.        aspirin 81 MG tablet Take 81 mg by mouth daily.   atorvastatin 20 MG tablet Commonly known as: LIPITOR TAKE 1 TABLET (20 MG TOTAL) BY MOUTH DAILY.   Dulaglutide 0.75 MG/0.5ML Sopn Commonly known as: Trulicity Inject in the abdominal skin as directed once a week   glipiZIDE 5 MG 24 hr tablet Commonly known as: GLUCOTROL XL TAKE 1 TABLET BY MOUTH EVERY DAY   ipratropium 0.06 % nasal spray Commonly known as: Atrovent Place 2 sprays into both nostrils 4 (four) times daily.   levothyroxine 100 MCG tablet Commonly known as:  SYNTHROID TAKE 1 TABLET BY MOUTH EVERY DAY   losartan 100 MG tablet Commonly known as: COZAAR TAKE 1 TABLET BY MOUTH ONCE DAILY   metFORMIN 1000 MG tablet Commonly known as: GLUCOPHAGE TAKE 1 TABLET BY MOUTH TWICE A DAY   metoprolol tartrate 100 MG tablet Commonly known as: LOPRESSOR Take 1 tablet 2 hours prior to Cardiac CT   pioglitazone 15 MG tablet Commonly known as: Actos Take 2 tablets (30 mg total) by mouth daily. What changed: how much to take   valACYclovir 500 MG tablet Commonly known as: VALTREX TAKE 1 TABLET (500 MG TOTAL) BY MOUTH DAILY.       Allergies: No Known Allergies  Past Medical History:  Diagnosis Date  . ARTHRITIS,  HIP 08/10/2008  . BURSITIS, RIGHT SHOULDER 05/20/2008  . Degeneration of lumbar or lumbosacral intervertebral disc 11/27/2006  . DEGENERATION, CERVICAL DISC 11/27/2006  . Degenerative disc disease, lumbar   . Depression   . DEPRESSION 11/25/2006  . Diabetes mellitus   . DIABETES MELLITUS, TYPE II 04/10/2007  . Epicondylitis    bilateral  . Fatigue   . HERPES GENITALIS 09/17/2007  . Hyperlipemia   . HYPERLIPIDEMIA 03/31/2007  . Hypertension   . HYPERTENSION 11/25/2006  . HYPOTHYROIDISM 10/28/2007  . Impaired fasting glucose 12/26/2006  . Lateral epicondylitis  of elbow 05/05/2007  . Sciatica 10/28/2007  . SHOULDER PAIN, RIGHT 08/18/2008    Past Surgical History:  Procedure Laterality Date  . ACHILLES TENDON REPAIR     left  . ELBOW SURGERY  2005   left  . herniated lumbar disc  1997, 2011  . KNEE ARTHROSCOPY  2007   left  . ROTATOR CUFF REPAIR  1997   right    Family History  Problem Relation Age of Onset  . Diabetes Mother   . Heart disease Mother   . Heart disease Father   . Diabetes Sister   . Colon cancer Neg Hx     Social History:  reports that he has never smoked. He has never used smokeless tobacco. He reports current alcohol use of about 3.0 standard drinks of alcohol per week. He reports that he does not use  drugs.    Review of Systems         Lipids: He has been treated with Lipitor 20 mg  He is sometimes forgetting to take his atorvastatin since he was told to take it at bedtime and his LDL is higher  He has been treated by his PCP who considered increasing the dose ALT has been normal      Lab Results  Component Value Date   CHOL 205 (H) 03/12/2019   HDL 54.30 03/12/2019   LDLCALC 131 (H) 03/12/2019   LDLDIRECT 79.0 07/23/2018   TRIG 97.0 03/12/2019   CHOLHDL 4 03/12/2019                   Thyroid: He has been on Synthroid for several years years, initially had symptoms of fatigue at diagnosis He is on 100 mcg  This is followed by PCP He tends to feel sleepy in the evenings but his thyroid levels are consistently normal  Lab Results  Component Value Date   TSH 3.21 03/12/2019   TSH 4.11 12/04/2018   TSH 3.72 02/27/2018      Followed by pulmonologist for pulmonary fibrosis  LABS:  Office Visit on 03/17/2019  Component Date Value Ref Range Status  . Hemoglobin A1C 03/17/2019 7.2* 4.0 - 5.6 % Final  Office Visit on 03/12/2019  Component Date Value Ref Range Status  . Total Bilirubin 03/12/2019 0.6  0.2 - 1.2 mg/dL Final  . Bilirubin, Direct 03/12/2019 0.1  0.0 - 0.3 mg/dL Final  . Alkaline Phosphatase 03/12/2019 33* 39 - 117 U/L Final  . AST 03/12/2019 15  0 - 37 U/L Final  . ALT 03/12/2019 18  0 - 53 U/L Final  . Total Protein 03/12/2019 7.0  6.0 - 8.3 g/dL Final  . Albumin 03/12/2019 4.5  3.5 - 5.2 g/dL Final  . Sodium 03/12/2019 138  135 - 145 mEq/L Final  . Potassium 03/12/2019 4.6  3.5 - 5.1 mEq/L Final  . Chloride 03/12/2019 101  96 - 112 mEq/L Final  .  CO2 03/12/2019 32  19 - 32 mEq/L Final  . Glucose, Bld 03/12/2019 130* 70 - 99 mg/dL Final  . BUN 03/12/2019 20  6 - 23 mg/dL Final  . Creatinine, Ser 03/12/2019 1.18  0.40 - 1.50 mg/dL Final  . GFR 03/12/2019 61.56  >60.00 mL/min Final  . Calcium 03/12/2019 9.8  8.4 - 10.5 mg/dL Final  . PSA 03/12/2019  1.90  0.10 - 4.00 ng/mL Final   Test performed using Access Hybritech PSA Assay, a parmagnetic partical, chemiluminecent immunoassay.  . Cholesterol 03/12/2019 205* 0 - 200 mg/dL Final   ATP III Classification       Desirable:  < 200 mg/dL               Borderline High:  200 - 239 mg/dL          High:  > = 240 mg/dL  . Triglycerides 03/12/2019 97.0  0.0 - 149.0 mg/dL Final   Normal:  <150 mg/dLBorderline High:  150 - 199 mg/dL  . HDL 03/12/2019 54.30  >39.00 mg/dL Final  . VLDL 03/12/2019 19.4  0.0 - 40.0 mg/dL Final  . LDL Cholesterol 03/12/2019 131* 0 - 99 mg/dL Final  . Total CHOL/HDL Ratio 03/12/2019 4   Final                  Men          Women1/2 Average Risk     3.4          3.3Average Risk          5.0          4.42X Average Risk          9.6          7.13X Average Risk          15.0          11.0                      . NonHDL 03/12/2019 150.88   Final   NOTE:  Non-HDL goal should be 30 mg/dL higher than patient's LDL goal (i.e. LDL goal of < 70 mg/dL, would have non-HDL goal of < 100 mg/dL)  . WBC 03/12/2019 6.6  4.0 - 10.5 K/uL Final  . RBC 03/12/2019 4.71  4.22 - 5.81 Mil/uL Final  . Hemoglobin 03/12/2019 14.2  13.0 - 17.0 g/dL Final  . HCT 03/12/2019 43.2  39.0 - 52.0 % Final  . MCV 03/12/2019 91.6  78.0 - 100.0 fl Final  . MCHC 03/12/2019 32.9  30.0 - 36.0 g/dL Final  . RDW 03/12/2019 12.8  11.5 - 15.5 % Final  . Platelets 03/12/2019 220.0  150.0 - 400.0 K/uL Final  . Neutrophils Relative % 03/12/2019 63.3  43.0 - 77.0 % Final  . Lymphocytes Relative 03/12/2019 23.2  12.0 - 46.0 % Final  . Monocytes Relative 03/12/2019 8.4  3.0 - 12.0 % Final  . Eosinophils Relative 03/12/2019 4.1  0.0 - 5.0 % Final  . Basophils Relative 03/12/2019 1.0  0.0 - 3.0 % Final  . Neutro Abs 03/12/2019 4.2  1.4 - 7.7 K/uL Final  . Lymphs Abs 03/12/2019 1.5  0.7 - 4.0 K/uL Final  . Monocytes Absolute 03/12/2019 0.5  0.1 - 1.0 K/uL Final  . Eosinophils Absolute 03/12/2019 0.3  0.0 - 0.7 K/uL Final   . Basophils Absolute 03/12/2019 0.1  0.0 - 0.1 K/uL Final  . Color, Urine 03/12/2019 YELLOW  Yellow;Lt. Yellow;Straw;Dark Yellow;Amber;Green;Red;Brown Final  . APPearance 03/12/2019 CLEAR  Clear;Turbid;Slightly Cloudy;Cloudy Final  . Specific Gravity, Urine 03/12/2019 1.015  1.000 - 1.030 Final  . pH 03/12/2019 6.0  5.0 - 8.0 Final  . Total Protein, Urine 03/12/2019 NEGATIVE  Negative Final  . Urine Glucose 03/12/2019 NEGATIVE  Negative Final  . Ketones, ur 03/12/2019 NEGATIVE  Negative Final  . Bilirubin Urine 03/12/2019 NEGATIVE  Negative Final  . Hgb urine dipstick 03/12/2019 NEGATIVE  Negative Final  . Urobilinogen, UA 03/12/2019 0.2  0.0 - 1.0 Final  . Leukocytes,Ua 03/12/2019 NEGATIVE  Negative Final  . Nitrite 03/12/2019 NEGATIVE  Negative Final  . TSH 03/12/2019 3.21  0.35 - 4.50 uIU/mL Final    Physical Examination:  BP 140/70 (BP Location: Left Arm, Patient Position: Sitting, Cuff Size: Normal)   Pulse 84   Ht 5\' 6"  (1.676 m)   Wt 164 lb 6.4 oz (74.6 kg)   SpO2 96%   BMI 26.53 kg/m              ASSESSMENT:  Diabetes type 2, nonobese.   See history of present illness for detailed discussion of his current management, blood sugar patterns and problems identified  A1c is still not adequate at 7.2 previously was controlled at 6.8 in 6/20  As discussed above he is somewhat inconsistent with his diet and he can do better with cutting back on sweets and carbohydrates Recently is trying to do more exercise However still has occasional readings over 200 postprandially and morning sugars may be mildly increased even at home He is concerned about possible side effects from Actos but has been taking this long-term Also unlikely that he is having in the evening inconsistent epigastric discomfort related to taking Trulicity  He is concerned about possibly going on insulin and wants to avoid this  Hypertension: Generally well controlled  Hypothyroidism: His TSH is  normal  LIPIDS: His LDL is relatively high and above target but appears that he forgets to take his Lipitor periodically at bedtime since he does not take any other medications at the same time Also likely can do better with diet especially cutting back on sweets like cakes  PLAN:   He was given the option of increasing the Trulicity to 1.5 mg but he wants to hold off on this  For now we will continue with 15 mg of Actos but may consider increasing the dose for improved benefit  Discussed blood sugar targets at various times  If his A1c is higher on the next visit despite better diet and exercise regimen will increase Trulicity  He needs to take his Lipitor at dinnertime or even morning for better compliance and will recheck lipids on the next visit  LDL needs to be below 100  Follow-up in 3 months   Patient Instructions  Check blood sugars on waking up 3-4 days a week  Also check blood sugars about 2 hours after meals and do this after different meals by rotation  Recommended blood sugar levels on waking up are 90-130 and about 2 hours after meal is 130-160  Please bring your blood sugar monitor to each visit, thank you     Elayne Snare 03/17/2019, 9:44 AM   Note: This office note was prepared with Dragon voice recognition system technology. Any transcriptional errors that result from this process are unintentional.

## 2019-03-17 ENCOUNTER — Encounter: Payer: Self-pay | Admitting: Endocrinology

## 2019-03-17 ENCOUNTER — Other Ambulatory Visit: Payer: Self-pay

## 2019-03-17 ENCOUNTER — Ambulatory Visit (INDEPENDENT_AMBULATORY_CARE_PROVIDER_SITE_OTHER): Payer: PRIVATE HEALTH INSURANCE | Admitting: Endocrinology

## 2019-03-17 VITALS — BP 140/70 | HR 84 | Ht 66.0 in | Wt 164.4 lb

## 2019-03-17 DIAGNOSIS — E119 Type 2 diabetes mellitus without complications: Secondary | ICD-10-CM | POA: Diagnosis not present

## 2019-03-17 DIAGNOSIS — I10 Essential (primary) hypertension: Secondary | ICD-10-CM | POA: Diagnosis not present

## 2019-03-17 DIAGNOSIS — E782 Mixed hyperlipidemia: Secondary | ICD-10-CM | POA: Diagnosis not present

## 2019-03-17 LAB — POCT GLYCOSYLATED HEMOGLOBIN (HGB A1C): Hemoglobin A1C: 7.2 % — AB (ref 4.0–5.6)

## 2019-03-17 NOTE — Patient Instructions (Signed)
Check blood sugars on waking up 3-4 days a week  Also check blood sugars about 2 hours after meals and do this after different meals by rotation  Recommended blood sugar levels on waking up are 90-130 and about 2 hours after meal is 130-160  Please bring your blood sugar monitor to each visit, thank you   

## 2019-03-28 ENCOUNTER — Other Ambulatory Visit: Payer: Self-pay | Admitting: Endocrinology

## 2019-03-30 ENCOUNTER — Other Ambulatory Visit: Payer: Self-pay

## 2019-03-30 ENCOUNTER — Ambulatory Visit (AMBULATORY_SURGERY_CENTER): Payer: Self-pay | Admitting: *Deleted

## 2019-03-30 VITALS — Temp 97.5°F | Ht 66.0 in | Wt 170.2 lb

## 2019-03-30 DIAGNOSIS — Z86010 Personal history of colonic polyps: Secondary | ICD-10-CM

## 2019-03-30 DIAGNOSIS — Z01818 Encounter for other preprocedural examination: Secondary | ICD-10-CM

## 2019-03-30 LAB — HM DIABETES EYE EXAM

## 2019-03-30 MED ORDER — SUPREP BOWEL PREP KIT 17.5-3.13-1.6 GM/177ML PO SOLN: ORAL | 0 refills | Status: DC

## 2019-03-30 NOTE — Progress Notes (Signed)
Pt is aware that care partner will wait in the car during procedure; if they feel like they will be too hot or cold to wait in the car; they may wait in the 4 th floor lobby. Patient is aware to bring only one care partner. We want them to wear a mask (we do not have any that we can provide them), practice social distancing, and we will check their temperatures when they get here.  I did remind the patient that their care partner needs to stay in the parking lot the entire time and have a cell phone available, we will call them when the pt is ready for discharge. Patient will wear mask into building.  No egg or soy allergy  No home oxygen use or problems with anesthesia  No medications for weight loss taken  emmi information given  $15 off Suprep given  covid test 04-03-19 at 805 am

## 2019-04-01 ENCOUNTER — Telehealth: Payer: Self-pay | Admitting: Gastroenterology

## 2019-04-01 NOTE — Telephone Encounter (Signed)
Pt states that prep is still expensive even with coupon. He wants to know if we can give him a different option. Pls call him .

## 2019-04-01 NOTE — Telephone Encounter (Signed)
Spoke with pharmacy, Suprep $50 with universal coupon. Pt then notified, he said he will contact the pharmacy because he was not told that. Patient needs 2 day prep, pt aware. Patient will call us back with any problems.

## 2019-04-03 ENCOUNTER — Ambulatory Visit (INDEPENDENT_AMBULATORY_CARE_PROVIDER_SITE_OTHER): Payer: PRIVATE HEALTH INSURANCE

## 2019-04-03 ENCOUNTER — Encounter: Payer: Self-pay | Admitting: Gastroenterology

## 2019-04-03 DIAGNOSIS — Z1159 Encounter for screening for other viral diseases: Secondary | ICD-10-CM

## 2019-04-04 LAB — SARS CORONAVIRUS 2 (TAT 6-24 HRS): SARS Coronavirus 2: NEGATIVE

## 2019-04-07 ENCOUNTER — Other Ambulatory Visit: Payer: Self-pay

## 2019-04-07 ENCOUNTER — Encounter: Payer: Self-pay | Admitting: Gastroenterology

## 2019-04-07 ENCOUNTER — Ambulatory Visit (AMBULATORY_SURGERY_CENTER): Payer: PRIVATE HEALTH INSURANCE | Admitting: Gastroenterology

## 2019-04-07 VITALS — BP 150/91 | HR 69 | Temp 97.1°F | Resp 12 | Ht 66.0 in | Wt 170.0 lb

## 2019-04-07 DIAGNOSIS — D123 Benign neoplasm of transverse colon: Secondary | ICD-10-CM

## 2019-04-07 DIAGNOSIS — Z1211 Encounter for screening for malignant neoplasm of colon: Secondary | ICD-10-CM

## 2019-04-07 MED ORDER — SODIUM CHLORIDE 0.9 % IV SOLN: 500.0000 mL | Freq: Once | INTRAVENOUS | Status: DC

## 2019-04-07 NOTE — Patient Instructions (Signed)
YOU HAD AN ENDOSCOPIC PROCEDURE TODAY AT THE [facility]:   Refer to the procedure report that was given to you for any specific questions about what was found during the examination.  If the procedure report does not answer your questions, please call your gastroenterologist to clarify.  If you requested that your care partner not be given the details of your procedure findings, then the procedure report has been included in a sealed envelope for you to review at your convenience later.  YOU SHOULD EXPECT: Some feelings of bloating in the abdomen. Passage of more gas than usual.  Walking can help get rid of the air that was put into your GI tract during the procedure and reduce the bloating. If you had a lower endoscopy (such as a colonoscopy or flexible sigmoidoscopy) you may notice spotting of blood in your stool or on the toilet paper. If you underwent a bowel prep for your procedure, you may not have a normal bowel movement for a few days.  Please Note:  You might notice some irritation and congestion in your nose or some drainage.  This is from the oxygen used during your procedure.  There is no need for concern and it should clear up in a day or so.  SYMPTOMS TO REPORT IMMEDIATELY:   Following lower endoscopy (colonoscopy or flexible sigmoidoscopy):  Excessive amounts of blood in the stool  Significant tenderness or worsening of abdominal pains  Swelling of the abdomen that is new, acute  Fever of 100F or higher  For urgent or emergent issues, a gastroenterologist can be reached at any hour by calling [phone].   DIET:  We do recommend a small meal at first, but then you may proceed to your regular diet.  Drink plenty of fluids but you should avoid alcoholic beverages for 24 hours.  ACTIVITY:  You should plan to take it easy for the rest of today and you should NOT DRIVE or use heavy machinery until tomorrow (because of the sedation medicines used during the test).     FOLLOW UP: Our staff will call the number listed on your records 48-72 hours following your procedure to check on you and address any questions or concerns that you may have regarding the information given to you following your procedure. If we do not reach you, we will leave a message.  We will attempt to reach you two times.  During this call, we will ask if you have developed any symptoms of COVID 19. If you develop any symptoms (ie: fever, flu-like symptoms, shortness of breath, cough etc.) before then, please call [phone].  If you test positive for Covid 19 in the 2 weeks post procedure, please call and report this information to us.    If any biopsies were taken you will be contacted by phone or by letter within the next 1-3 weeks.  Please call us at [phone] if you have not heard about the biopsies in 3 weeks.    SIGNATURES/CONFIDENTIALITY: You and/or your care partner have signed paperwork which will be entered into your electronic medical record.  These signatures attest to the fact that that the information above on your After Visit Summary has been reviewed and is understood.  Full responsibility of the confidentiality of this discharge information lies with you and/or your care-partner. 

## 2019-04-07 NOTE — Op Note (Signed)
[facility] Patient Name: Ricardo [name] Procedure Date: 04/07/2019 8:16 AM MRN: [id number] Endoscopist: Remo Lipps P. Havery Moros , MD Age: 69 Referring MD:  Date of Birth: [birth year] Gender: Male Account #: [id number] Procedure:                Colonoscopy Indications:              Screening for colorectal malignant neoplasm - last                            colonoscopy in 2014 limited by poor prep Medicines:                Monitored Anesthesia Care Procedure:                Pre-Anesthesia Assessment:                           - Prior to the procedure, a History and Physical                            was performed, and patient medications and                            allergies were reviewed. The patient's tolerance of                            previous anesthesia was also reviewed. The risks                            and benefits of the procedure and the sedation                            options and risks were discussed with the patient.                            All questions were answered, and informed consent                            was obtained. Prior Anticoagulants: The patient has                            taken no previous anticoagulant or antiplatelet                            agents. ASA Grade Assessment: II - A patient with                            mild systemic disease. After reviewing the risks                            and benefits, the patient was deemed in                            satisfactory condition to undergo the procedure.  After obtaining informed consent, the colonoscope                            was passed under direct vision. Throughout the                            procedure, the patient's blood pressure, pulse, and                            oxygen saturations were monitored continuously. The                            Colonoscope was introduced through the anus and                            advanced to  the the cecum, identified by                            appendiceal orifice and ileocecal valve. The                            colonoscopy was performed without difficulty. The                            patient tolerated the procedure well. The quality                            of the bowel preparation was adequate. The                            ileocecal valve, appendiceal orifice, and rectum                            were photographed. Scope In: 8:21:33 AM Scope Out: 8:49:04 AM Scope Withdrawal Time: 0 hours 20 minutes 29 seconds  Total Procedure Duration: 0 hours 27 minutes 31 seconds  Findings:                 The perianal and digital rectal examinations were                            normal.                           Five sessile polyps were found in the transverse                            colon. The polyps were 3 to 4 mm in size. These                            polyps were removed with a cold snare. Resection                            and retrieval were complete.  Multiple small-mouthed diverticula were found in                            the sigmoid colon.                           Internal hemorrhoids were found during retroflexion.                           The sigmoid colon was tortous. Some residual stool                            was noted throughout the colon and several minutes                            were taken to lavage it to obtain adequate views.                            The exam was otherwise without abnormality. Complications:            No immediate complications. Estimated blood loss:                            Minimal. Estimated Blood Loss:     Estimated blood loss was minimal. Impression:               - Five 3 to 4 mm polyps in the transverse colon,                            removed with a cold snare. Resected and retrieved.                           - Diverticulosis in the sigmoid colon.                           -  Internal hemorrhoids.                           - The examination was otherwise normal. Recommendation:           - Patient has a contact number available for                            emergencies. The signs and symptoms of potential                            delayed complications were discussed with the                            patient. Return to normal activities tomorrow.                            Written discharge instructions were provided to the                            patient.                           -  Resume previous diet.                           - Continue present medications.                           - Await pathology results. Remo Lipps P. Eean Buss, MD 04/07/2019 8:54:41 AM This report has been signed electronically.

## 2019-04-07 NOTE — Progress Notes (Signed)
Report to PACU, RN, vss, BBS= Clear.  

## 2019-04-07 NOTE — Progress Notes (Signed)
Pt's states no medical or surgical changes since previsit or office visit.  JB - temp DT - vitals 

## 2019-04-07 NOTE — Progress Notes (Signed)
Called to room to assist during endoscopic procedure.  Patient ID and intended procedure confirmed with present staff. Received instructions for my participation in the procedure from the performing physician.  

## 2019-04-09 ENCOUNTER — Telehealth: Payer: Self-pay | Admitting: Internal Medicine

## 2019-04-09 ENCOUNTER — Telehealth: Payer: Self-pay

## 2019-04-09 NOTE — Telephone Encounter (Signed)
Left message on answering machine. 

## 2019-04-09 NOTE — Telephone Encounter (Signed)
Patient had colonoscopy with Dr. Havery Moros 2 days ago (reviewed). No problems after... Describes a mild tolerable non-progressive generalized "soreness". However, he does not call concerned about that, but rather a sharp pain in the LLQ "scrotal area" once yesterday and once today. Each lasted 10 minutes then completely resolved. No N,V, fever, bleeding, urinary complaints or bulge. Denies a history of kidney stones or inguinal hernia. Eating well and resumed normal activities. No BM since his exam. No pain now. I told him that I wasn't sure of the cause or if it was procedure related, but seemed unlikely. I did advise to seek medical attention if it persists or worsens. To ER if severe. He agreed. Will forward to Dr. Havery Moros.

## 2019-04-10 ENCOUNTER — Telehealth: Payer: Self-pay

## 2019-04-10 NOTE — Telephone Encounter (Signed)
Thanks Ricardo [name]. Agree with your assessment. [name] can you please touch base with this patient later today and see how he is feeling? Thanks

## 2019-04-10 NOTE — Telephone Encounter (Signed)
Left message for patient to please call back. 

## 2019-04-14 ENCOUNTER — Telehealth: Payer: Self-pay

## 2019-04-14 NOTE — Telephone Encounter (Signed)
Called patient again and left messages on both phone#s, that I was following up on his call end of last week to the on-call MD. Asked him to please call us back if he is still having any symptoms/problems

## 2019-04-14 NOTE — Telephone Encounter (Signed)
Patient called back, says the pain he had on Friday 04/10/19 has not come back and the irritation in his colon from his colonoscopy is also almost gone

## 2019-05-09 ENCOUNTER — Other Ambulatory Visit: Payer: Self-pay | Admitting: Endocrinology

## 2019-05-19 ENCOUNTER — Telehealth: Payer: Self-pay

## 2019-05-19 NOTE — Telephone Encounter (Signed)
Spoke to the patient to make him aware that Dr. Dwyane Dee would be out of the office for his original appointment on 06/19/19 so I changed it to 06/17/19 at 8:30-I requested the patient call back when he is done with the appointment he was at so that I can verify that this date and time will work for him-he agreed

## 2019-05-26 ENCOUNTER — Encounter: Payer: Self-pay | Admitting: Internal Medicine

## 2019-05-26 ENCOUNTER — Ambulatory Visit (INDEPENDENT_AMBULATORY_CARE_PROVIDER_SITE_OTHER): Payer: PRIVATE HEALTH INSURANCE

## 2019-05-26 ENCOUNTER — Other Ambulatory Visit: Payer: Self-pay

## 2019-05-26 ENCOUNTER — Ambulatory Visit: Payer: PRIVATE HEALTH INSURANCE | Admitting: Internal Medicine

## 2019-05-26 DIAGNOSIS — M25561 Pain in right knee: Secondary | ICD-10-CM | POA: Diagnosis not present

## 2019-05-26 MED ORDER — MELOXICAM 15 MG PO TABS: ORAL_TABLET | ORAL | 0 refills | Status: DC

## 2019-05-26 NOTE — Assessment & Plan Note (Signed)
Dr [name] if not better Meloxicam X ray Ice ACE wrap

## 2019-05-26 NOTE — Progress Notes (Signed)
Subjective:  Patient ID: Ricardo [name], male    DOB: September 27, 1950  Age: 69 y.o. MRN: [id number]  CC: No chief complaint on file.   HPI [name] presents for R knee pain x 1 month, no swelling, redness Worse w/activity   Outpatient Medications Prior to Visit  Medication Sig Dispense Refill  . alfuzosin (UROXATRAL) 10 MG 24 hr tablet Take 10 mg by mouth daily with breakfast.    . aspirin 81 MG tablet Take 81 mg by mouth daily.      [name] atorvastatin (LIPITOR) 20 MG tablet TAKE 1 TABLET (20 MG TOTAL) BY MOUTH DAILY. 90 tablet 3  . Dulaglutide (TRULICITY) A999333 0000000 SOPN Inject in the abdominal skin as directed once a week 12 pen 2  . dutasteride (AVODART) 0.5 MG capsule Take 0.5 mg by mouth daily.    [name] glipiZIDE (GLUCOTROL XL) 5 MG 24 hr tablet TAKE 1 TABLET BY MOUTH EVERY DAY 90 tablet 0  . ipratropium (ATROVENT) 0.06 % nasal spray Place 2 sprays into both nostrils 4 (four) times daily. 15 mL 0  . levothyroxine (SYNTHROID) 100 MCG tablet TAKE 1 TABLET BY MOUTH EVERY DAY 90 tablet 1  . losartan (COZAAR) 100 MG tablet TAKE 1 TABLET BY MOUTH ONCE DAILY 90 tablet 1  . metFORMIN (GLUCOPHAGE) 1000 MG tablet TAKE 1 TABLET BY MOUTH TWICE A DAY 180 tablet 3  . metoprolol tartrate (LOPRESSOR) 100 MG tablet Take 1 tablet 2 hours prior to Cardiac CT 1 tablet 0  . pioglitazone (ACTOS) 15 MG tablet Take 1 tablet (15 mg total) by mouth daily. 90 tablet 1  . valACYclovir (VALTREX) 500 MG tablet TAKE 1 TABLET (500 MG TOTAL) BY MOUTH DAILY. 90 tablet 2   No facility-administered medications prior to visit.    ROS: Review of Systems  Constitutional: Negative for appetite change, fatigue and unexpected weight change.  HENT: Negative for congestion, nosebleeds, sneezing, sore throat and trouble swallowing.   Eyes: Negative for itching and visual disturbance.  Respiratory: Negative for cough.   Cardiovascular: Negative for chest pain, palpitations and leg swelling.  Gastrointestinal:  Negative for abdominal distention, blood in stool, diarrhea and nausea.  Genitourinary: Negative for frequency and hematuria.  Musculoskeletal: Positive for arthralgias and gait problem. Negative for back pain, joint swelling and neck pain.  Skin: Negative for rash.  Neurological: Negative for dizziness, tremors, speech difficulty and weakness.  Psychiatric/Behavioral: Negative for agitation, dysphoric mood and sleep disturbance. The patient is not nervous/anxious.     Objective:  BP (!) 148/82 (BP Location: Left Arm, Patient Position: Sitting, Cuff Size: Normal)   Pulse 68   Temp 98.7 F (37.1 C) (Oral)   Ht 5\' 6"  (1.676 m)   Wt 166 lb (75.3 kg)   SpO2 97%   BMI 26.79 kg/m   BP Readings from Last 3 Encounters:  05/26/19 (!) 148/82  04/07/19 (!) 150/91  03/17/19 140/70    Wt Readings from Last 3 Encounters:  05/26/19 166 lb (75.3 kg)  04/07/19 170 lb (77.1 kg)  03/30/19 170 lb 3.2 oz (77.2 kg)    Physical Exam Constitutional:      General: He is not in acute distress.    Appearance: He is well-developed.     Comments: NAD  Eyes:     Conjunctiva/sclera: Conjunctivae normal.     Pupils: Pupils are equal, round, and reactive to light.  Neck:     Thyroid: No thyromegaly.     Vascular: No JVD.  Cardiovascular:     Rate and Rhythm: Normal rate and regular rhythm.     Heart sounds: Normal heart sounds. No murmur. No friction rub. No gallop.   Pulmonary:     Effort: Pulmonary effort is normal. No respiratory distress.     Breath sounds: Normal breath sounds. No wheezing or rales.  Chest:     Chest wall: No tenderness.  Abdominal:     General: Bowel sounds are normal. There is no distension.     Palpations: Abdomen is soft. There is no mass.     Tenderness: There is no abdominal tenderness. There is no guarding or rebound.  Musculoskeletal:        General: Tenderness present. Normal range of motion.     Cervical back: Normal range of motion.  Lymphadenopathy:      Cervical: No cervical adenopathy.  Skin:    General: Skin is warm and dry.     Findings: No rash.  Neurological:     Mental Status: He is alert and oriented to person, place, and time.     Cranial Nerves: No cranial nerve deficit.     Motor: No abnormal muscle tone.     Coordination: Coordination normal.     Gait: Gait normal.     Deep Tendon Reflexes: Reflexes are normal and symmetric.  Psychiatric:        Behavior: Behavior normal.        Thought Content: Thought content normal.        Judgment: Judgment normal.    R inner knee is tender w/ROM; stable exam, no edema   Lab Results  Component Value Date   WBC 6.6 03/12/2019   HGB 14.2 03/12/2019   HCT 43.2 03/12/2019   PLT 220.0 03/12/2019   GLUCOSE 130 (H) 03/12/2019   CHOL 205 (H) 03/12/2019   TRIG 97.0 03/12/2019   HDL 54.30 03/12/2019   LDLDIRECT 79.0 07/23/2018   LDLCALC 131 (H) 03/12/2019   ALT 18 03/12/2019   AST 15 03/12/2019   NA 138 03/12/2019   K 4.6 03/12/2019   CL 101 03/12/2019   CREATININE 1.18 03/12/2019   BUN 20 03/12/2019   CO2 32 03/12/2019   TSH 3.21 03/12/2019   PSA 1.90 03/12/2019   INR 1.0 04/02/2007   HGBA1C 7.2 (A) 03/17/2019   MICROALBUR <0.7 02/27/2018    CT Chest High Resolution  Result Date: 05/07/2018 CLINICAL DATA:  Shortness of breath on exertion. A bronchitis in January. Persistent cough. EXAM: CT CHEST WITHOUT CONTRAST TECHNIQUE: Multidetector CT imaging of the chest was performed following the standard protocol without intravenous contrast. High resolution imaging of the lungs, as well as inspiratory and expiratory imaging, was performed. COMPARISON:  02/18/2018, 04/01/2007 and 04/24/2006. FINDINGS: Cardiovascular: Atherosclerotic calcification of the aorta. Heart is at the upper limits of normal in size. No pericardial effusion. Mediastinum/Nodes: No pathologically enlarged mediastinal or axillary lymph nodes. Hilar regions are difficult to evaluate without IV contrast. Esophagus is  grossly unremarkable. Lungs/Pleura: Peripheral and basilar subpleural ground-glass and interstitial coarsening with probable mild traction bronchiolectasis. No honeycombing. Minimal subpleural reticular densities. Findings are stable to minimally progressive from 04/01/2007. Mild air trapping. No pleural fluid. Airway is unremarkable. Upper Abdomen: Visualized portions of the liver, adrenal glands and right kidney are unremarkable. 4.7 cm low-attenuation lesion in the left kidney is incompletely imaged but likely a cyst. Visualized portions of the spleen, pancreas, stomach and bowel are grossly unremarkable. No upper abdominal adenopathy. Musculoskeletal: Degenerative changes in the spine.  No worrisome lytic or sclerotic lesions. IMPRESSION: 1. 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 Findings are indeterminate for UIP per consensus guidelines: Diagnosis of Idiopathic Pulmonary Fibrosis: An Official ATS/ERS/JRS/ALAT Clinical Practice Guideline. [place], Iss 5, [phone], Oct 27 2016. 2.  Aortic atherosclerosis (ICD10-170.0). Electronically Signed   By: Lorin Picket M.D.   On: 05/07/2018 15:08    Assessment & Plan:     Walker Kehr, MD

## 2019-05-28 ENCOUNTER — Other Ambulatory Visit: Payer: PRIVATE HEALTH INSURANCE

## 2019-06-01 ENCOUNTER — Ambulatory Visit: Payer: PRIVATE HEALTH INSURANCE | Admitting: Endocrinology

## 2019-06-15 ENCOUNTER — Other Ambulatory Visit (INDEPENDENT_AMBULATORY_CARE_PROVIDER_SITE_OTHER): Payer: 59

## 2019-06-15 ENCOUNTER — Other Ambulatory Visit: Payer: Self-pay

## 2019-06-15 DIAGNOSIS — E1165 Type 2 diabetes mellitus with hyperglycemia: Secondary | ICD-10-CM | POA: Diagnosis not present

## 2019-06-15 DIAGNOSIS — E782 Mixed hyperlipidemia: Secondary | ICD-10-CM | POA: Diagnosis not present

## 2019-06-15 LAB — COMPREHENSIVE METABOLIC PANEL
ALT: 12 U/L (ref 0–53)
AST: 16 U/L (ref 0–37)
Albumin: 4.5 g/dL (ref 3.5–5.2)
Alkaline Phosphatase: 31 U/L — ABNORMAL LOW (ref 39–117)
BUN: 18 mg/dL (ref 6–23)
CO2: 30 mEq/L (ref 19–32)
Calcium: 9.9 mg/dL (ref 8.4–10.5)
Chloride: 100 mEq/L (ref 96–112)
Creatinine, Ser: 1.2 mg/dL (ref 0.40–1.50)
GFR: 60.33 mL/min (ref >60.00)
Glucose, Bld: 108 mg/dL — ABNORMAL HIGH (ref 70–99)
Potassium: 4.3 mEq/L (ref 3.5–5.1)
Sodium: 137 mEq/L (ref 135–145)
Total Bilirubin: 0.9 mg/dL (ref 0.2–1.2)
Total Protein: 7.1 g/dL (ref 6.0–8.3)

## 2019-06-15 LAB — BASIC METABOLIC PANEL
BUN: 18 mg/dL (ref 6–23)
CO2: 30 mEq/L (ref 19–32)
Calcium: 9.9 mg/dL (ref 8.4–10.5)
Chloride: 100 mEq/L (ref 96–112)
Creatinine, Ser: 1.2 mg/dL (ref 0.40–1.50)
GFR: 60.33 mL/min (ref >60.00)
Glucose, Bld: 108 mg/dL — ABNORMAL HIGH (ref 70–99)
Potassium: 4.3 mEq/L (ref 3.5–5.1)
Sodium: 137 mEq/L (ref 135–145)

## 2019-06-15 LAB — MICROALBUMIN / CREATININE URINE RATIO
Creatinine,U: 49.8 mg/dL
Microalb Creat Ratio: 1.4 mg/g (ref 0.0–30.0)
Microalb, Ur: <0.7 mg/dL (ref 0.0–1.9)

## 2019-06-15 LAB — LIPID PANEL
Cholesterol: 167 mg/dL (ref 0–200)
HDL: 60.5 mg/dL (ref >39.00)
LDL Cholesterol: 96 mg/dL (ref 0–99)
NonHDL: 106.18
Total CHOL/HDL Ratio: 3
Triglycerides: 52 mg/dL (ref 0.0–149.0)
VLDL: 10.4 mg/dL (ref 0.0–40.0)

## 2019-06-15 LAB — HEMOGLOBIN A1C: Hgb A1c MFr Bld: 8.1 % — ABNORMAL HIGH (ref 4.6–6.5)

## 2019-06-16 ENCOUNTER — Other Ambulatory Visit: Payer: PRIVATE HEALTH INSURANCE

## 2019-06-16 ENCOUNTER — Other Ambulatory Visit: Payer: Self-pay | Admitting: Endocrinology

## 2019-06-17 ENCOUNTER — Other Ambulatory Visit: Payer: Self-pay | Admitting: Internal Medicine

## 2019-06-17 ENCOUNTER — Ambulatory Visit (INDEPENDENT_AMBULATORY_CARE_PROVIDER_SITE_OTHER): Payer: 59 | Admitting: Endocrinology

## 2019-06-17 ENCOUNTER — Encounter: Payer: Self-pay | Admitting: Endocrinology

## 2019-06-17 ENCOUNTER — Other Ambulatory Visit: Payer: Self-pay

## 2019-06-17 VITALS — BP 136/76 | HR 75 | Ht 66.0 in | Wt 166.4 lb

## 2019-06-17 DIAGNOSIS — E1165 Type 2 diabetes mellitus with hyperglycemia: Secondary | ICD-10-CM | POA: Diagnosis not present

## 2019-06-17 DIAGNOSIS — E063 Autoimmune thyroiditis: Secondary | ICD-10-CM

## 2019-06-17 DIAGNOSIS — E782 Mixed hyperlipidemia: Secondary | ICD-10-CM

## 2019-06-17 MED ORDER — GLIPIZIDE ER 2.5 MG PO TB24: 2.5000 mg | ORAL_TABLET | Freq: Every day | ORAL | 3 refills | Status: DC

## 2019-06-17 MED ORDER — TRULICITY 1.5 MG/0.5ML ~~LOC~~ SOAJ: SUBCUTANEOUS | 2 refills | Status: DC

## 2019-06-17 NOTE — Progress Notes (Signed)
Patient ID: Ricardo [name], male   DOB: 1950/07/14, 69 y.o.   MRN: [id number]           Reason for Appointment: Follow-up for Type 2 Diabetes   Referring physician:  [name]   History of Present Illness:          Diagnosis: Type 2 diabetes mellitus, date of diagnosis: 2009       Past history: At diagnosis he was having symptoms of fatigue.  He was started on glipizide initially and subsequently metformin was added.  Further details are not available However he has been somewhat irregular with his follow-up and did not have an A1c with his PCP between 8/13 and 11/15 He was referred here because of rising A1c of 7.5 He had been started on Trulicity in [id number] because of gradually increasing A1c He stopped taking Trulicity in [id number] because of out-of-pocket expense  Recent history:     Oral hypoglycemic drugs : Trulicity A999333 mg weekly, glipizide ER 5 mg, metformin 1 g twice a day, Actos 15 mg daily     His A1c is up to 8.1% compared to 7.2 Previously has been as low as 6.8 and highest 9.1  Current blood sugar patterns, problems identified and management:  On his last visit he was recommended increasing his Trulicity to 1.5 but he wanted to try adjusting his diet and exercise regimen  However he has not been doing well with his diet and eating more high-fat foods like pizza, sweets and snacks.  He says that this is because he is wife has some of these items lying around  He was previously exercising but he has not been motivated to do any lately  No side effects from Trulicity [id number]  As before his blood sugars are difficult to analyze from his insurance provided meter  He has had a couple of readings in the 50s when he forgot to eat lunch and was working outside all day  Blood sugar was high this morning from eating dessert last night        Side effects from medications have been: None Compliance with the medical regimen: Fair   Glucose monitoring:  done usually one  time a day         Glucometer:  [place]  Blood Glucose readings reviewed from monitor meter fax information  AVERAGE 153 for 30 days Blood sugar range 56-344  Before meal average 132 and after meal average 158  Previously:  PRE-MEAL  before meal   after meal  Overall  Glucose range:  86-215   79-178   79-221  Mean/median:  120   115   129     Self-care:  Meals: 3 meals per day. Breakfast is oatmeal/cereal/eggs and toast.  Has half sandwich or soup at lunch, avoiding rice and carbs usually        Dietician visit, most recent: at diagnosis               Weight history:   Wt Readings from Last 3 Encounters:  06/17/19 166 lb 6.4 oz (75.5 kg)  05/26/19 166 lb (75.3 kg)  04/07/19 170 lb (77.1 kg)    Glycemic control:   Lab Results  Component Value Date   HGBA1C 8.1 (H) 06/15/2019   HGBA1C 7.2 (A) 03/17/2019   HGBA1C 7.3 (H) 12/04/2018   Lab Results  Component Value Date   MICROALBUR <0.7 06/15/2019   LDLCALC 96 06/15/2019   CREATININE 1.20 06/15/2019   CREATININE 1.20 06/15/2019  Lab on 06/15/2019  Component Date Value Ref Range Status  . Cholesterol 06/15/2019 167  0 - 200 mg/dL Final   ATP III Classification       Desirable:  < 200 mg/dL               Borderline High:  200 - 239 mg/dL          High:  > = 240 mg/dL  . Triglycerides 06/15/2019 52.0  0.0 - 149.0 mg/dL Final   Normal:  <150 mg/dLBorderline High:  150 - 199 mg/dL  . HDL 06/15/2019 60.50  >39.00 mg/dL Final  . VLDL 06/15/2019 10.4  0.0 - 40.0 mg/dL Final  . LDL Cholesterol 06/15/2019 96  0 - 99 mg/dL Final  . Total CHOL/HDL Ratio 06/15/2019 3   Final                  Men          Women1/2 Average Risk     3.4          3.3Average Risk          5.0          4.42X Average Risk          9.6          7.13X Average Risk          15.0          11.0                      . NonHDL 06/15/2019 106.18   Final   NOTE:  Non-HDL goal should be 30 mg/dL higher than patient's LDL goal (i.e. LDL goal of < 70 mg/dL,  would have non-HDL goal of < 100 mg/dL)  . Sodium 06/15/2019 137  135 - 145 mEq/L Final  . Potassium 06/15/2019 4.3  3.5 - 5.1 mEq/L Final  . Chloride 06/15/2019 100  96 - 112 mEq/L Final  . CO2 06/15/2019 30  19 - 32 mEq/L Final  . Glucose, Bld 06/15/2019 108* 70 - 99 mg/dL Final  . BUN 06/15/2019 18  6 - 23 mg/dL Final  . Creatinine, Ser 06/15/2019 1.20  0.40 - 1.50 mg/dL Final  . Total Bilirubin 06/15/2019 0.9  0.2 - 1.2 mg/dL Final  . Alkaline Phosphatase 06/15/2019 31* 39 - 117 U/L Final  . AST 06/15/2019 16  0 - 37 U/L Final  . ALT 06/15/2019 12  0 - 53 U/L Final  . Total Protein 06/15/2019 7.1  6.0 - 8.3 g/dL Final  . Albumin 06/15/2019 4.5  3.5 - 5.2 g/dL Final  . GFR 06/15/2019 60.33  >60.00 mL/min Final  . Calcium 06/15/2019 9.9  8.4 - 10.5 mg/dL Final  . Microalb, Ur 06/15/2019 <0.7  0.0 - 1.9 mg/dL Final  . Creatinine,U 06/15/2019 49.8  mg/dL Final  . Microalb Creat Ratio 06/15/2019 1.4  0.0 - 30.0 mg/g Final  . Sodium 06/15/2019 137  135 - 145 mEq/L Final  . Potassium 06/15/2019 4.3  3.5 - 5.1 mEq/L Final  . Chloride 06/15/2019 100  96 - 112 mEq/L Final  . CO2 06/15/2019 30  19 - 32 mEq/L Final  . Glucose, Bld 06/15/2019 108* 70 - 99 mg/dL Final  . BUN 06/15/2019 18  6 - 23 mg/dL Final  . Creatinine, Ser 06/15/2019 1.20  0.40 - 1.50 mg/dL Final  . GFR 06/15/2019 60.33  >60.00 mL/min Final  . Calcium 06/15/2019 9.9  8.4 -  10.5 mg/dL Final  . Hgb A1c MFr Bld 06/15/2019 8.1* 4.6 - 6.5 % Final   Glycemic Control Guidelines for People with Diabetes:Non Diabetic:  <6%Goal of Therapy: <7%Additional Action Suggested:  >8%      Allergies as of 06/17/2019   No Known Allergies     Medication List       Accurate as of June 17, 2019  8:43 AM. If you have any questions, ask your nurse or doctor.        STOP taking these medications   metoprolol tartrate 100 MG tablet Commonly known as: LOPRESSOR Stopped by: Elayne Snare, MD     TAKE these medications   alfuzosin 10 MG  24 hr tablet Commonly known as: UROXATRAL Take 10 mg by mouth daily with breakfast.   aspirin 81 MG tablet Take 81 mg by mouth daily.   atorvastatin 20 MG tablet Commonly known as: LIPITOR TAKE 1 TABLET (20 MG TOTAL) BY MOUTH DAILY.   dutasteride 0.5 MG capsule Commonly known as: AVODART Take 0.5 mg by mouth daily.   glipiZIDE 5 MG 24 hr tablet Commonly known as: GLUCOTROL XL TAKE 1 TABLET BY MOUTH EVERY DAY   ipratropium 0.06 % nasal spray Commonly known as: Atrovent Place 2 sprays into both nostrils 4 (four) times daily.   levothyroxine 100 MCG tablet Commonly known as: SYNTHROID TAKE 1 TABLET BY MOUTH EVERY DAY   losartan 100 MG tablet Commonly known as: COZAAR TAKE 1 TABLET BY MOUTH ONCE DAILY   meloxicam 15 MG tablet Commonly known as: MOBIC Use qd pc x 1 week then qd prn   metFORMIN 1000 MG tablet Commonly known as: GLUCOPHAGE TAKE 1 TABLET BY MOUTH TWICE A DAY   pioglitazone 15 MG tablet Commonly known as: ACTOS Take 1 tablet (15 mg total) by mouth daily.   Trulicity A999333 0000000 Sopn Generic drug: Dulaglutide INJECT IN THE ABDOMINAL SKIN AS DIRECTED ONCE A WEEK   valACYclovir 500 MG tablet Commonly known as: VALTREX TAKE 1 TABLET (500 MG TOTAL) BY MOUTH DAILY.       Allergies: No Known Allergies  Past Medical History:  Diagnosis Date  . ARTHRITIS, HIP 08/10/2008  . BURSITIS, RIGHT SHOULDER 05/20/2008  . Degeneration of lumbar or lumbosacral intervertebral disc 11/27/2006  . DEGENERATION, CERVICAL DISC 11/27/2006  . Degenerative disc disease, lumbar   . Depression    no per pt 03-30-19  . DEPRESSION 11/25/2006  . Diabetes mellitus   . DIABETES MELLITUS, TYPE II 04/10/2007  . Epicondylitis    bilateral  . Fatigue   . HERPES GENITALIS 09/17/2007  . Hyperlipemia   . HYPERLIPIDEMIA 03/31/2007  . Hypertension   . HYPERTENSION 11/25/2006  . HYPOTHYROIDISM 10/28/2007  . Impaired fasting glucose 12/26/2006  . Lateral epicondylitis  of elbow 05/05/2007  .  Sciatica 10/28/2007  . SHOULDER PAIN, RIGHT 08/18/2008    Past Surgical History:  Procedure Laterality Date  . ACHILLES TENDON REPAIR     left  . COLONOSCOPY    . ELBOW SURGERY  2005   left  . herniated lumbar disc  1997, 2011  . KNEE ARTHROSCOPY  2007   left  . ROTATOR CUFF REPAIR  1997   right    Family History  Problem Relation Age of Onset  . Diabetes Mother   . Heart disease Mother   . Heart disease Father   . Diabetes Sister   . Colon cancer Neg Hx   . Esophageal cancer Neg Hx   . Rectal  cancer Neg Hx   . Stomach cancer Neg Hx     Social History:  reports that he has never smoked. He has never used smokeless tobacco. He reports current alcohol use of about 3.0 standard drinks of alcohol per week. He reports that he does not use drugs.    Review of Systems         Lipids: He has been treated with Lipitor 20 mg  He now has taken his Lipitor more consistently and LDL is improved  ALT has been normal      Lab Results  Component Value Date   CHOL 167 06/15/2019   HDL 60.50 06/15/2019   LDLCALC 96 06/15/2019   LDLDIRECT 79.0 07/23/2018   TRIG 52.0 06/15/2019   CHOLHDL 3 06/15/2019                   Thyroid: He has been on Synthroid for several years years, initially had symptoms of fatigue at diagnosis He is on 100 mcg  This is followed by PCP   Lab Results  Component Value Date   TSH 3.21 03/12/2019   TSH 4.11 12/04/2018   TSH 3.72 02/27/2018      Followed by pulmonologist for pulmonary fibrosis  LABS:  Lab on 06/15/2019  Component Date Value Ref Range Status  . Cholesterol 06/15/2019 167  0 - 200 mg/dL Final   ATP III Classification       Desirable:  < 200 mg/dL               Borderline High:  200 - 239 mg/dL          High:  > = 240 mg/dL  . Triglycerides 06/15/2019 52.0  0.0 - 149.0 mg/dL Final   Normal:  <150 mg/dLBorderline High:  150 - 199 mg/dL  . HDL 06/15/2019 60.50  >39.00 mg/dL Final  . VLDL 06/15/2019 10.4  0.0 - 40.0 mg/dL Final    . LDL Cholesterol 06/15/2019 96  0 - 99 mg/dL Final  . Total CHOL/HDL Ratio 06/15/2019 3   Final                  Men          Women1/2 Average Risk     3.4          3.3Average Risk          5.0          4.42X Average Risk          9.6          7.13X Average Risk          15.0          11.0                      . NonHDL 06/15/2019 106.18   Final   NOTE:  Non-HDL goal should be 30 mg/dL higher than patient's LDL goal (i.e. LDL goal of < 70 mg/dL, would have non-HDL goal of < 100 mg/dL)  . Sodium 06/15/2019 137  135 - 145 mEq/L Final  . Potassium 06/15/2019 4.3  3.5 - 5.1 mEq/L Final  . Chloride 06/15/2019 100  96 - 112 mEq/L Final  . CO2 06/15/2019 30  19 - 32 mEq/L Final  . Glucose, Bld 06/15/2019 108* 70 - 99 mg/dL Final  . BUN 06/15/2019 18  6 - 23 mg/dL Final  . Creatinine, Ser 06/15/2019 1.20  0.40 - 1.50  mg/dL Final  . Total Bilirubin 06/15/2019 0.9  0.2 - 1.2 mg/dL Final  . Alkaline Phosphatase 06/15/2019 31* 39 - 117 U/L Final  . AST 06/15/2019 16  0 - 37 U/L Final  . ALT 06/15/2019 12  0 - 53 U/L Final  . Total Protein 06/15/2019 7.1  6.0 - 8.3 g/dL Final  . Albumin 06/15/2019 4.5  3.5 - 5.2 g/dL Final  . GFR 06/15/2019 60.33  >60.00 mL/min Final  . Calcium 06/15/2019 9.9  8.4 - 10.5 mg/dL Final  . Microalb, Ur 06/15/2019 <0.7  0.0 - 1.9 mg/dL Final  . Creatinine,U 06/15/2019 49.8  mg/dL Final  . Microalb Creat Ratio 06/15/2019 1.4  0.0 - 30.0 mg/g Final  . Sodium 06/15/2019 137  135 - 145 mEq/L Final  . Potassium 06/15/2019 4.3  3.5 - 5.1 mEq/L Final  . Chloride 06/15/2019 100  96 - 112 mEq/L Final  . CO2 06/15/2019 30  19 - 32 mEq/L Final  . Glucose, Bld 06/15/2019 108* 70 - 99 mg/dL Final  . BUN 06/15/2019 18  6 - 23 mg/dL Final  . Creatinine, Ser 06/15/2019 1.20  0.40 - 1.50 mg/dL Final  . GFR 06/15/2019 60.33  >60.00 mL/min Final  . Calcium 06/15/2019 9.9  8.4 - 10.5 mg/dL Final  . Hgb A1c MFr Bld 06/15/2019 8.1* 4.6 - 6.5 % Final   Glycemic Control Guidelines for  People with Diabetes:Non Diabetic:  <6%Goal of Therapy: <7%Additional Action Suggested:  >8%     Physical Examination:  BP 136/76 (BP Location: Left Arm, Patient Position: Sitting, Cuff Size: Normal)   Pulse 75   Ht 5\' 6"  (1.676 m)   Wt 166 lb 6.4 oz (75.5 kg)   SpO2 97%   BMI 26.86 kg/m              ASSESSMENT:  Diabetes type 2, nonobese.   See history of present illness for detailed discussion of his current management, blood sugar patterns and problems identified  A1c has gone up further to 8.1  He is not able to consistently get his blood sugars controlled because of inconsistent diet and lack of regular exercise program Blood sugars are quite variable at home ranging from 59 up to 344 He has tendency to rare low blood sugars when he is skipping a meal at lunchtime Most of his high sugars are related to high fat foods, sweets including some overnight   Hypertension: Again well controlled   LIPIDS: His LDL is better with taking atorvastatin regularly Also can do better with diet   PLAN:   He agrees to try 1.5 mg Trulicity weekly and he will let us know if he has any abdominal discomfort or nausea with this  Reduce glipizide to 2.5 mg and consider stopping this  Make sure he does not skip meals  He was given the option of seeing the dietitian with his wife but he does not want to do so  He needs to restart his exercise program with his home exercise equipment  Try to cut back on sweets and high-fat foods and snacks Continue same dose of atorvastatin Follow-up in 3 months   There are no Patient Instructions on file for this visit.  Elayne Snare 06/17/2019, 8:43 AM   Note: This office note was prepared with Dragon voice recognition system technology. Any transcriptional errors that result from this process are unintentional.

## 2019-06-17 NOTE — Patient Instructions (Addendum)
Check blood sugars on waking up days a week  Also check blood sugars about 2 hours after meals and do this after different meals by rotation  Recommended blood sugar levels on waking up are 90-130 and about 2 hours after meal is 130-160  Please bring your blood sugar monitor to each visit, thank you  Walk daily 

## 2019-06-19 ENCOUNTER — Ambulatory Visit: Payer: PRIVATE HEALTH INSURANCE | Admitting: Endocrinology

## 2019-07-23 ENCOUNTER — Other Ambulatory Visit: Payer: Self-pay | Admitting: Internal Medicine

## 2019-09-07 ENCOUNTER — Other Ambulatory Visit: Payer: Self-pay | Admitting: Internal Medicine

## 2019-09-07 NOTE — Telephone Encounter (Signed)
Diabetes manage by Dr. Dwyane Dee forwarding msg to endo.Marland KitchenJohny Chess

## 2019-09-10 ENCOUNTER — Ambulatory Visit: Payer: 59 | Admitting: Internal Medicine

## 2019-09-10 ENCOUNTER — Encounter: Payer: Self-pay | Admitting: Internal Medicine

## 2019-09-10 ENCOUNTER — Ambulatory Visit (INDEPENDENT_AMBULATORY_CARE_PROVIDER_SITE_OTHER): Payer: 59

## 2019-09-10 ENCOUNTER — Other Ambulatory Visit: Payer: Self-pay

## 2019-09-10 VITALS — BP 140/72 | HR 70 | Temp 98.0°F | Ht 66.0 in | Wt 159.4 lb

## 2019-09-10 DIAGNOSIS — M546 Pain in thoracic spine: Secondary | ICD-10-CM

## 2019-09-10 DIAGNOSIS — M5134 Other intervertebral disc degeneration, thoracic region: Secondary | ICD-10-CM

## 2019-09-10 MED ORDER — TIZANIDINE HCL 2 MG PO CAPS: 2.0000 mg | ORAL_CAPSULE | Freq: Three times a day (TID) | ORAL | 1 refills | Status: DC | PRN

## 2019-09-10 MED ORDER — KETOROLAC TROMETHAMINE 60 MG/2ML IM SOLN
60.0000 mg | Freq: Once | INTRAMUSCULAR | Status: AC
Start: 2019-09-10 — End: 2019-09-10
  Administered 2019-09-10: 60 mg via INTRAMUSCULAR

## 2019-09-10 NOTE — Patient Instructions (Signed)
Acute Back Pain, Adult Acute back pain is sudden and usually short-lived. It is often caused by an injury to the muscles and tissues in the back. The injury may result from:  A muscle or ligament getting overstretched or torn (strained). Ligaments are tissues that connect bones to each other. Lifting something improperly can cause a back strain.  Wear and tear (degeneration) of the spinal disks. Spinal disks are circular tissue that provides cushioning between the bones of the spine (vertebrae).  Twisting motions, such as while playing sports or doing yard work.  A hit to the back.  Arthritis. You may have a physical exam, lab tests, and imaging tests to find the cause of your pain. Acute back pain usually goes away with rest and home care. Follow these instructions at home: Managing pain, stiffness, and swelling  Take over-the-counter and prescription medicines only as told by your health care provider.  Your health care provider may recommend applying ice during the first 24-48 hours after your pain starts. To do this: ? Put ice in a plastic bag. ? Place a towel between your skin and the bag. ? Leave the ice on for 20 minutes, 2-3 times a day.  If directed, apply heat to the affected area as often as told by your health care provider. Use the heat source that your health care provider recommends, such as a moist heat pack or a heating pad. ? Place a towel between your skin and the heat source. ? Leave the heat on for 20-30 minutes. ? Remove the heat if your skin turns bright red. This is especially important if you are unable to feel pain, heat, or cold. You have a greater risk of getting burned. Activity   Do not stay in bed. Staying in bed for more than 1-2 days can delay your recovery.  Sit up and stand up straight. Avoid leaning forward when you sit, or hunching over when you stand. ? If you work at a desk, sit close to it so you do not need to lean over. Keep your chin tucked  in. Keep your neck drawn back, and keep your elbows bent at a right angle. Your arms should look like the letter "L." ? Sit high and close to the steering wheel when you drive. Add lower back (lumbar) support to your car seat, if needed.  Take short walks on even surfaces as soon as you are able. Try to increase the length of time you walk each day.  Do not sit, drive, or stand in one place for more than 30 minutes at a time. Sitting or standing for long periods of time can put stress on your back.  Do not drive or use heavy machinery while taking prescription pain medicine.  Use proper lifting techniques. When you bend and lift, use positions that put less stress on your back: ? Bend your knees. ? Keep the load close to your body. ? Avoid twisting.  Exercise regularly as told by your health care provider. Exercising helps your back heal faster and helps prevent back injuries by keeping muscles strong and flexible.  Work with a physical therapist to make a safe exercise program, as recommended by your health care provider. Do any exercises as told by your physical therapist. Lifestyle  Maintain a healthy weight. Extra weight puts stress on your back and makes it difficult to have good posture.  Avoid activities or situations that make you feel anxious or stressed. Stress and anxiety increase muscle   tension and can make back pain worse. Learn ways to manage anxiety and stress, such as through exercise. General instructions  Sleep on a firm mattress in a comfortable position. Try lying on your side with your knees slightly bent. If you lie on your back, put a pillow under your knees.  Follow your treatment plan as told by your health care provider. This may include: ? Cognitive or behavioral therapy. ? Acupuncture or massage therapy. ? Meditation or yoga. Contact a health care provider if:  You have pain that is not relieved with rest or medicine.  You have increasing pain going down  into your legs or buttocks.  Your pain does not improve after 2 weeks.  You have pain at night.  You lose weight without trying.  You have a fever or chills. Get help right away if:  You develop new bowel or bladder control problems.  You have unusual weakness or numbness in your arms or legs.  You develop nausea or vomiting.  You develop abdominal pain.  You feel faint. Summary  Acute back pain is sudden and usually short-lived.  Use proper lifting techniques. When you bend and lift, use positions that put less stress on your back.  Take over-the-counter and prescription medicines and apply heat or ice as directed by your health care provider. This information is not intended to replace advice given to you by your health care provider. Make sure you discuss any questions you have with your health care provider. Document Revised: 06/03/2018 Document Reviewed: 09/26/2016 [employer] Patient Education  2020 [employer]  

## 2019-09-10 NOTE — Progress Notes (Signed)
Subjective:  Patient ID: Ricardo [name], male    DOB: 04/24/1950  Age: 69 y.o. MRN: [id number]  CC: Back Pain  This visit occurred during the SARS-CoV-2 public health emergency.  Safety protocols were in place, including screening questions prior to the visit, additional usage of staff PPE, and extensive cleaning of exam room while observing appropriate contact time as indicated for disinfecting solutions.    HPI [name] presents for concerns about back pain - He was painting his home about 4 weeks ago, during repetitive activities and he developed pain in his thoracic spine that radiates from the left scapular region to the right scapular region.  He denies any specific trauma or injury.  He describes a constant achy sensation with intermittent spasms.  He has not gotten much symptom relief with meloxicam but he admits he does not take it very frequently.  Outpatient Medications Prior to Visit  Medication Sig Dispense Refill  . alfuzosin (UROXATRAL) 10 MG 24 hr tablet Take 10 mg by mouth daily with breakfast.    . aspirin 81 MG tablet Take 81 mg by mouth daily.      [name] atorvastatin (LIPITOR) 20 MG tablet TAKE 1 TABLET (20 MG TOTAL) BY MOUTH DAILY. 90 tablet 3  . Dulaglutide (TRULICITY) 1.5 PZ/0.2HE SOPN Inject weekly 4 pen 2  . dutasteride (AVODART) 0.5 MG capsule Take 0.5 mg by mouth daily.    [name] glipiZIDE (GLIPIZIDE XL) 2.5 MG 24 hr tablet Take 1 tablet (2.5 mg total) by mouth daily with breakfast. 30 tablet 3  . levothyroxine (SYNTHROID) 100 MCG tablet TAKE 1 TABLET BY MOUTH EVERY DAY 90 tablet 1  . losartan (COZAAR) 100 MG tablet TAKE 1 TABLET BY MOUTH ONCE DAILY 90 tablet 1  . metFORMIN (GLUCOPHAGE) 1000 MG tablet TAKE 1 TABLET BY MOUTH TWICE A DAY 180 tablet 3  . pioglitazone (ACTOS) 15 MG tablet Take 1 tablet (15 mg total) by mouth daily. 90 tablet 1  . valACYclovir (VALTREX) 500 MG tablet TAKE 1 TABLET (500 MG TOTAL) BY MOUTH DAILY. 90 tablet 2  . meloxicam (MOBIC) 15  MG tablet TAKE 1 TABLET ONCE DAILY WITH FOOD/MEAL FOR 1 WEEK THEN DAILY AS NEEDED (Patient not taking: Reported on 09/10/2019) 30 tablet 2  . ipratropium (ATROVENT) 0.06 % nasal spray Place 2 sprays into both nostrils 4 (four) times daily. 15 mL 0   No facility-administered medications prior to visit.    ROS Review of Systems  Constitutional: Negative for chills, diaphoresis, fatigue and fever.  HENT: Negative.   Eyes: Negative.   Respiratory: Negative for cough, chest tightness, shortness of breath and wheezing.   Cardiovascular: Negative for chest pain, palpitations and leg swelling.  Gastrointestinal: Negative for abdominal pain.  Endocrine: Negative.   Genitourinary: Negative.  Negative for difficulty urinating, flank pain and hematuria.  Musculoskeletal: Positive for back pain. Negative for arthralgias and myalgias.  Skin: Negative.  Negative for rash.  Neurological: Negative for dizziness, weakness, numbness and headaches.  Hematological: Negative for adenopathy. Does not bruise/bleed easily.  Psychiatric/Behavioral: Negative.     Objective:  BP 140/72 (BP Location: Left Arm, Patient Position: Sitting, Cuff Size: Normal)   Pulse 70   Temp 98 F (36.7 C) (Oral)   Ht 5\' 6"  (1.676 m)   Wt 159 lb 6 oz (72.3 kg)   SpO2 96%   BMI 25.72 kg/m   BP Readings from Last 3 Encounters:  09/10/19 140/72  06/17/19 136/76  05/26/19 (!) 148/82  Wt Readings from Last 3 Encounters:  09/10/19 159 lb 6 oz (72.3 kg)  06/17/19 166 lb 6.4 oz (75.5 kg)  05/26/19 166 lb (75.3 kg)    Physical Exam Vitals reviewed.  Constitutional:      Appearance: Normal appearance.  HENT:     Nose: Nose normal.     Mouth/Throat:     Mouth: Mucous membranes are moist.  Eyes:     General: No scleral icterus.    Conjunctiva/sclera: Conjunctivae normal.  Cardiovascular:     Rate and Rhythm: Normal rate and regular rhythm.     Heart sounds: No murmur heard.   Pulmonary:     Effort: Pulmonary  effort is normal.     Breath sounds: No stridor. No wheezing, rhonchi or rales.  Abdominal:     General: Abdomen is flat.     Palpations: There is no mass.     Tenderness: There is no abdominal tenderness. There is no guarding.  Musculoskeletal:        General: Normal range of motion.     Cervical back: Normal and neck supple.     Thoracic back: Spasms present. No edema, tenderness or bony tenderness. No scoliosis.     Lumbar back: Normal.     Right lower leg: No edema.     Left lower leg: No edema.  Lymphadenopathy:     Cervical: No cervical adenopathy.  Skin:    General: Skin is warm and dry.     Coloration: Skin is not pale.     Findings: No rash.  Neurological:     General: No focal deficit present.     Mental Status: He is alert and oriented to person, place, and time. Mental status is at baseline.     Motor: No weakness.     Coordination: Coordination normal.     Gait: Gait normal.     Deep Tendon Reflexes: Reflexes normal.  Psychiatric:        Mood and Affect: Mood normal.        Behavior: Behavior normal.     Lab Results  Component Value Date   WBC 6.6 03/12/2019   HGB 14.2 03/12/2019   HCT 43.2 03/12/2019   PLT 220.0 03/12/2019   GLUCOSE 108 (H) 06/15/2019   GLUCOSE 108 (H) 06/15/2019   CHOL 167 06/15/2019   TRIG 52.0 06/15/2019   HDL 60.50 06/15/2019   LDLDIRECT 79.0 07/23/2018   LDLCALC 96 06/15/2019   ALT 12 06/15/2019   AST 16 06/15/2019   NA 137 06/15/2019   NA 137 06/15/2019   K 4.3 06/15/2019   K 4.3 06/15/2019   CL 100 06/15/2019   CL 100 06/15/2019   CREATININE 1.20 06/15/2019   CREATININE 1.20 06/15/2019   BUN 18 06/15/2019   BUN 18 06/15/2019   CO2 30 06/15/2019   CO2 30 06/15/2019   TSH 3.21 03/12/2019   PSA 1.90 03/12/2019   INR 1.0 04/02/2007   HGBA1C 8.1 (H) 06/15/2019   MICROALBUR <0.7 06/15/2019    CT Chest High Resolution  Result Date: 05/07/2018 CLINICAL DATA:  Shortness of breath on exertion. A bronchitis in January.  Persistent cough. EXAM: CT CHEST WITHOUT CONTRAST TECHNIQUE: Multidetector CT imaging of the chest was performed following the standard protocol without intravenous contrast. High resolution imaging of the lungs, as well as inspiratory and expiratory imaging, was performed. COMPARISON:  02/18/2018, 04/01/2007 and 04/24/2006. FINDINGS: Cardiovascular: Atherosclerotic calcification of the aorta. Heart is at the upper limits of normal  in size. No pericardial effusion. Mediastinum/Nodes: No pathologically enlarged mediastinal or axillary lymph nodes. Hilar regions are difficult to evaluate without IV contrast. Esophagus is grossly unremarkable. Lungs/Pleura: Peripheral and basilar subpleural ground-glass and interstitial coarsening with probable mild traction bronchiolectasis. No honeycombing. Minimal subpleural reticular densities. Findings are stable to minimally progressive from 04/01/2007. Mild air trapping. No pleural fluid. Airway is unremarkable. Upper Abdomen: Visualized portions of the liver, adrenal glands and right kidney are unremarkable. 4.7 cm low-attenuation lesion in the left kidney is incompletely imaged but likely a cyst. Visualized portions of the spleen, pancreas, stomach and bowel are grossly unremarkable. No upper abdominal adenopathy. Musculoskeletal: Degenerative changes in the spine. No worrisome lytic or sclerotic lesions. IMPRESSION: 1. 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 Findings are indeterminate for UIP per consensus guidelines: Diagnosis of Idiopathic Pulmonary Fibrosis: An Official ATS/ERS/JRS/ALAT Clinical Practice Guideline. [place], Iss 5, [phone], Oct 27 2016. 2.  Aortic atherosclerosis (ICD10-170.0). Electronically Signed   By: Lorin Picket M.D.   On: 05/07/2018 15:08   DG Thoracic Spine  W/Swimmers  Result Date: 09/10/2019 CLINICAL DATA:  Thoracic spine pain for 1 month without known injury. EXAM: THORACIC SPINE - 3 VIEWS COMPARISON:  None. FINDINGS: No fracture or spondylolisthesis is noted. Moderate to severe degenerative disc disease is noted at multiple levels in the mid and lower thoracic spine. IMPRESSION: Moderate to severe multilevel degenerative disc disease. No acute abnormality seen. Electronically Signed   By: Marijo Conception M.D.   On: 09/10/2019 16:57     Assessment & Plan:   Jarel was seen today for back pain.  Diagnoses and all orders for this visit:  Thoracic spine pain -     DG Thoracic Spine W/Swimmers; Future -     ketorolac (TORADOL) injection 60 mg -     tizanidine (ZANAFLEX) 2 MG capsule; Take 1 capsule (2 mg total) by mouth 3 (three) times daily as needed for muscle spasms.  DDD (degenerative disc disease), thoracic- Based on his symptoms, exam, and plain films I think he is symptomatic from a flareup of degenerative disc disease in the T-spine.  I gave him an injection of Toradol in the office.  I have asked him to take meloxicam more consistently.  Will also try a muscle relaxant.   I have discontinued Jaylen Knope. Housley's ipratropium. I am also having him start on tizanidine. Additionally, I am having him maintain his aspirin, metFORMIN, valACYclovir, atorvastatin, levothyroxine, pioglitazone, dutasteride, alfuzosin, meloxicam, Trulicity, glipiZIDE, and losartan. We administered ketorolac.  Meds ordered this encounter  Medications  . ketorolac (TORADOL) injection 60 mg  . tizanidine (ZANAFLEX) 2 MG capsule    Sig: Take 1 capsule (2 mg total) by mouth 3 (three) times daily as needed for muscle spasms.    Dispense:  90 capsule    Refill:  1     Follow-up: Return in about 3 weeks (around 10/01/2019).  Scarlette Calico, MD

## 2019-09-11 ENCOUNTER — Telehealth: Payer: Self-pay | Admitting: Internal Medicine

## 2019-09-11 NOTE — Telephone Encounter (Signed)
New message:   Pt is calling and states he would like to know what the next steps are after seeing the Dr on yesterday. He states a MyChart message was sent but he doesn't understand what the next steps are to be taken. Please advise.

## 2019-09-11 NOTE — Telephone Encounter (Signed)
Called Ricardo [name] and lvm informing of Dr. Ronnald Ramp response to take the meloxicam and tizanidine and let us know how are you doing. Asked Ricardo [name] to call back if he had any questions or concerns.

## 2019-09-16 ENCOUNTER — Other Ambulatory Visit: Payer: 59

## 2019-09-18 ENCOUNTER — Ambulatory Visit: Payer: 59 | Admitting: Endocrinology

## 2019-10-02 ENCOUNTER — Other Ambulatory Visit: Payer: Self-pay | Admitting: Internal Medicine

## 2019-10-02 DIAGNOSIS — M546 Pain in thoracic spine: Secondary | ICD-10-CM

## 2019-10-06 ENCOUNTER — Other Ambulatory Visit: Payer: Self-pay | Admitting: Internal Medicine

## 2019-10-13 ENCOUNTER — Other Ambulatory Visit: Payer: Self-pay | Admitting: *Deleted

## 2019-10-13 MED ORDER — TRULICITY 1.5 MG/0.5ML ~~LOC~~ SOAJ: SUBCUTANEOUS | 2 refills | Status: DC

## 2019-10-31 ENCOUNTER — Other Ambulatory Visit: Payer: Self-pay | Admitting: Endocrinology

## 2019-11-10 ENCOUNTER — Other Ambulatory Visit (INDEPENDENT_AMBULATORY_CARE_PROVIDER_SITE_OTHER): Payer: 59

## 2019-11-10 ENCOUNTER — Other Ambulatory Visit: Payer: Self-pay

## 2019-11-10 DIAGNOSIS — E1165 Type 2 diabetes mellitus with hyperglycemia: Secondary | ICD-10-CM

## 2019-11-10 DIAGNOSIS — E063 Autoimmune thyroiditis: Secondary | ICD-10-CM | POA: Diagnosis not present

## 2019-11-10 LAB — COMPREHENSIVE METABOLIC PANEL
ALT: 13 U/L (ref 0–53)
AST: 12 U/L (ref 0–37)
Albumin: 4.3 g/dL (ref 3.5–5.2)
Alkaline Phosphatase: 31 U/L — ABNORMAL LOW (ref 39–117)
BUN: 13 mg/dL (ref 6–23)
CO2: 29 mEq/L (ref 19–32)
Calcium: 9.5 mg/dL (ref 8.4–10.5)
Chloride: 101 mEq/L (ref 96–112)
Creatinine, Ser: 1.2 mg/dL (ref 0.40–1.50)
GFR: 60.26 mL/min (ref >60.00)
Glucose, Bld: 133 mg/dL — ABNORMAL HIGH (ref 70–99)
Potassium: 4.2 mEq/L (ref 3.5–5.1)
Sodium: 138 mEq/L (ref 135–145)
Total Bilirubin: 0.9 mg/dL (ref 0.2–1.2)
Total Protein: 6.8 g/dL (ref 6.0–8.3)

## 2019-11-10 LAB — TSH: TSH: 3 u[IU]/mL (ref 0.35–4.50)

## 2019-11-10 LAB — HEMOGLOBIN A1C: Hgb A1c MFr Bld: 7.6 % — ABNORMAL HIGH (ref 4.6–6.5)

## 2019-11-12 NOTE — Progress Notes (Signed)
Patient ID: Ricardo [name], male   DOB: [birth year], 69 y.o.   MRN: [id number]           Reason for Appointment: Follow-up for Type 2 Diabetes   Referring physician:  [name]   History of Present Illness:          Diagnosis: Type 2 diabetes mellitus, date of diagnosis: 2009       Past history: At diagnosis he was having symptoms of fatigue.  He was started on glipizide initially and subsequently metformin was added.  Further details are not available However he has been somewhat irregular with his follow-up and did not have an A1c with his PCP between 8/13 and 11/15 He was referred here because of rising A1c of 7.5 He had been started on Trulicity in 9/37 because of gradually increasing A1c He stopped taking Trulicity in 1696 because of out-of-pocket expense  Recent history:     Oral hypoglycemic drugs : Trulicity 1.5mg  weekly, glipizide ER 5 mg, metformin 1 g twice a day, Actos 15 mg daily     His A1c is 7.6 compared to 8.1% previously Previously has been as low as 6.8 and highest 9.1  Current blood sugar patterns, problems identified and management:  On his last visit he was recommended increasing his Trulicity to 1.5 and he has done this without side effect  Again he has had inconsistent regimen of following his diet and exercising  He says he has lost 10 pounds and was doing really well until about a month ago  Since then he has been eating poorly and getting either high-fat meals, desserts or larger portions  He does try to exercise at home up to 45 minutes but not consistently also  Most of his blood sugars are in the morning  Recently highest sugar only 192  Fasting glucose 139 in the lab        Side effects from medications have been: None Compliance with the medical regimen: Fair   Glucose monitoring:  done usually one time a day         Glucometer:  [place]  Blood Glucose readings reviewed from monitor meter fax information  AVERAGE before meals  126, after meals 147  PRE-MEAL Fasting Lunch Dinner Bedtime Overall  Glucose range:  108-166   118    Mean/median:      139   POST-MEAL PC Breakfast PC Lunch PC Dinner  Glucose range:   146, 192 ?  Mean/median:      Previously:  AVERAGE 153 for 30 days Blood sugar range 56-344  Before meal average 132 and after meal average 158  Self-care:  Meals: 3 meals per day. Breakfast is oatmeal/cereal/eggs and toast.  Has half sandwich or soup at lunch, avoiding rice and carbs usually        Dietician visit, most recent: at diagnosis               Weight history:   Wt Readings from Last 3 Encounters:  11/13/19 157 lb 6.4 oz (71.4 kg)  09/10/19 159 lb 6 oz (72.3 kg)  06/17/19 166 lb 6.4 oz (75.5 kg)    Glycemic control:   Lab Results  Component Value Date   HGBA1C 7.6 (H) 11/10/2019   HGBA1C 8.1 (H) 06/15/2019   HGBA1C 7.2 (A) 03/17/2019   Lab Results  Component Value Date   MICROALBUR <0.7 06/15/2019   LDLCALC 96 06/15/2019   CREATININE 1.20 11/10/2019   Lab on 11/10/2019  Component Date  Value Ref Range Status  . TSH 11/10/2019 3.00  0.35 - 4.50 uIU/mL Final  . Sodium 11/10/2019 138  135 - 145 mEq/L Final  . Potassium 11/10/2019 4.2  3.5 - 5.1 mEq/L Final  . Chloride 11/10/2019 101  96 - 112 mEq/L Final  . CO2 11/10/2019 29  19 - 32 mEq/L Final  . Glucose, Bld 11/10/2019 133* 70 - 99 mg/dL Final  . BUN 11/10/2019 13  6 - 23 mg/dL Final  . Creatinine, Ser 11/10/2019 1.20  0.40 - 1.50 mg/dL Final  . Total Bilirubin 11/10/2019 0.9  0.2 - 1.2 mg/dL Final  . Alkaline Phosphatase 11/10/2019 31* 39 - 117 U/L Final  . AST 11/10/2019 12  0 - 37 U/L Final  . ALT 11/10/2019 13  0 - 53 U/L Final  . Total Protein 11/10/2019 6.8  6.0 - 8.3 g/dL Final  . Albumin 11/10/2019 4.3  3.5 - 5.2 g/dL Final  . GFR 11/10/2019 60.26  >60.00 mL/min Final  . Calcium 11/10/2019 9.5  8.4 - 10.5 mg/dL Final  . Hgb A1c MFr Bld 11/10/2019 7.6* 4.6 - 6.5 % Final   Glycemic Control Guidelines for  People with Diabetes:Non Diabetic:  <6%Goal of Therapy: <7%Additional Action Suggested:  >8%      Allergies as of 11/13/2019   No Known Allergies     Medication List       Accurate as of November 13, 2019  8:42 AM. If you have any questions, ask your nurse or doctor.        STOP taking these medications   meloxicam 15 MG tablet Commonly known as: MOBIC Stopped by: Elayne Snare, MD   tizanidine 2 MG capsule Commonly known as: ZANAFLEX Stopped by: Elayne Snare, MD     TAKE these medications   alfuzosin 10 MG 24 hr tablet Commonly known as: UROXATRAL Take 10 mg by mouth daily with breakfast.   aspirin 81 MG tablet Take 81 mg by mouth daily.   atorvastatin 20 MG tablet Commonly known as: LIPITOR TAKE 1 TABLET (20 MG TOTAL) BY MOUTH DAILY.   dutasteride 0.5 MG capsule Commonly known as: AVODART Take 0.5 mg by mouth daily.   glipiZIDE 2.5 MG 24 hr tablet Commonly known as: GLUCOTROL XL TAKE 1 TABLET BY MOUTH EVERY DAY WITH BREAKFAST   levothyroxine 100 MCG tablet Commonly known as: SYNTHROID TAKE 1 TABLET BY MOUTH EVERY DAY   losartan 100 MG tablet Commonly known as: COZAAR TAKE 1 TABLET BY MOUTH ONCE DAILY   metFORMIN 1000 MG tablet Commonly known as: GLUCOPHAGE TAKE 1 TABLET BY MOUTH TWICE A DAY   pioglitazone 15 MG tablet Commonly known as: ACTOS Take 1 tablet (15 mg total) by mouth daily.   Trulicity 1.5 EU/2.3NT Sopn Generic drug: Dulaglutide Inject weekly   valACYclovir 500 MG tablet Commonly known as: VALTREX TAKE 1 TABLET (500 MG TOTAL) BY MOUTH DAILY. What changed:   when to take this  reasons to take this       Allergies: No Known Allergies  Past Medical History:  Diagnosis Date  . ARTHRITIS, HIP 08/10/2008  . BURSITIS, RIGHT SHOULDER 05/20/2008  . Degeneration of lumbar or lumbosacral intervertebral disc 11/27/2006  . DEGENERATION, CERVICAL DISC 11/27/2006  . Degenerative disc disease, lumbar   . Depression    no per pt 03-30-19  .  DEPRESSION 11/25/2006  . Diabetes mellitus   . DIABETES MELLITUS, TYPE II 04/10/2007  . Epicondylitis    bilateral  . Fatigue   .  HERPES GENITALIS 09/17/2007  . Hyperlipemia   . HYPERLIPIDEMIA 03/31/2007  . Hypertension   . HYPERTENSION 11/25/2006  . HYPOTHYROIDISM 10/28/2007  . Impaired fasting glucose 12/26/2006  . Lateral epicondylitis  of elbow 05/05/2007  . Sciatica 10/28/2007  . SHOULDER PAIN, RIGHT 08/18/2008    Past Surgical History:  Procedure Laterality Date  . ACHILLES TENDON REPAIR     left  . COLONOSCOPY    . ELBOW SURGERY  2005   left  . herniated lumbar disc  1997, 2011  . KNEE ARTHROSCOPY  2007   left  . ROTATOR CUFF REPAIR  1997   right    Family History  Problem Relation Age of Onset  . Diabetes Mother   . Heart disease Mother   . Heart disease Father   . Diabetes Sister   . Colon cancer Neg Hx   . Esophageal cancer Neg Hx   . Rectal cancer Neg Hx   . Stomach cancer Neg Hx     Social History:  reports that he has never smoked. He has never used smokeless tobacco. He reports current alcohol use of about 3.0 standard drinks of alcohol per week. He reports that he does not use drugs.    Review of Systems         Lipids: He has been treated with Lipitor 20 mg  With taking this regularly LDL is improved  ALT has been normal      Lab Results  Component Value Date   CHOL 167 06/15/2019   HDL 60.50 06/15/2019   LDLCALC 96 06/15/2019   LDLDIRECT 79.0 07/23/2018   TRIG 52.0 06/15/2019   CHOLHDL 3 06/15/2019                   Thyroid: He has been on Synthroid for several years years, initially had symptoms of fatigue at diagnosis He is on 100 mcg  Labs consistently normal  Lab Results  Component Value Date   TSH 3.00 11/10/2019   TSH 3.21 03/12/2019   TSH 4.11 12/04/2018      Followed by pulmonologist for pulmonary fibrosis  LABS:  Lab on 11/10/2019  Component Date Value Ref Range Status  . TSH 11/10/2019 3.00  0.35 - 4.50 uIU/mL Final   . Sodium 11/10/2019 138  135 - 145 mEq/L Final  . Potassium 11/10/2019 4.2  3.5 - 5.1 mEq/L Final  . Chloride 11/10/2019 101  96 - 112 mEq/L Final  . CO2 11/10/2019 29  19 - 32 mEq/L Final  . Glucose, Bld 11/10/2019 133* 70 - 99 mg/dL Final  . BUN 11/10/2019 13  6 - 23 mg/dL Final  . Creatinine, Ser 11/10/2019 1.20  0.40 - 1.50 mg/dL Final  . Total Bilirubin 11/10/2019 0.9  0.2 - 1.2 mg/dL Final  . Alkaline Phosphatase 11/10/2019 31* 39 - 117 U/L Final  . AST 11/10/2019 12  0 - 37 U/L Final  . ALT 11/10/2019 13  0 - 53 U/L Final  . Total Protein 11/10/2019 6.8  6.0 - 8.3 g/dL Final  . Albumin 11/10/2019 4.3  3.5 - 5.2 g/dL Final  . GFR 11/10/2019 60.26  >60.00 mL/min Final  . Calcium 11/10/2019 9.5  8.4 - 10.5 mg/dL Final  . Hgb A1c MFr Bld 11/10/2019 7.6* 4.6 - 6.5 % Final   Glycemic Control Guidelines for People with Diabetes:Non Diabetic:  <6%Goal of Therapy: <7%Additional Action Suggested:  >8%     Physical Examination:  BP 122/72   Pulse 66  Ht 5\' 6"  (1.676 m)   Wt 157 lb 6.4 oz (71.4 kg)   SpO2 98%   BMI 25.41 kg/m              ASSESSMENT:  Diabetes type 2, nonobese.   See history of present illness for detailed discussion of his current management, blood sugar patterns and problems identified  A1c has improved to 7.6  May have benefited from 1.5 Trulicity since his last visit in 4/21  He is still not able to be consistent with watching his diet and exercising Most of his difficulty is eating high-fat foods and desserts especially when with his family Is able to keep his weight down somewhat compared to his last visit Discussed that his A1c is higher than predicted from his home average blood sugar of 139 This is mostly since he does not check readings after dinner  He may benefit from 3 mg Trulicity but again he wants to try and control his blood sugar on his own before doing this  Hypertension: well controlled  Hypothyroidism: Adequately  replaced   PLAN:   He was advised to check blood sugars after meals especially dinner more regularly  He is motivated to try and get more consistent with diet as before and he thinks he can do this if he is consistent with it  Stay on exercise regimen  Stay on reduced dose of glipizide 2.5 mg daily for now  To call if blood sugars are continuing to stay high despite his best efforts  We will consider 3 mg Trulicity on the next visit  No change in levothyroxine 100 mcg or losartan 100 mg   Follow-up in 3 months   There are no Patient Instructions on file for this visit.  Elayne Snare 11/13/2019, 8:42 AM   Note: This office note was prepared with Dragon voice recognition system technology. Any transcriptional errors that result from this process are unintentional.

## 2019-11-13 ENCOUNTER — Ambulatory Visit: Payer: 59 | Admitting: Endocrinology

## 2019-11-13 ENCOUNTER — Other Ambulatory Visit: Payer: Self-pay

## 2019-11-13 ENCOUNTER — Encounter: Payer: Self-pay | Admitting: Endocrinology

## 2019-11-13 VITALS — BP 122/72 | HR 66 | Ht 66.0 in | Wt 157.4 lb

## 2019-11-13 DIAGNOSIS — E1165 Type 2 diabetes mellitus with hyperglycemia: Secondary | ICD-10-CM

## 2019-11-13 DIAGNOSIS — E782 Mixed hyperlipidemia: Secondary | ICD-10-CM | POA: Diagnosis not present

## 2019-11-13 NOTE — Patient Instructions (Signed)
Check blood sugars on waking up 3 days a week  Also check blood sugars about 2 hours after meals and do this after different meals by rotation  Recommended blood sugar levels on waking up are 90-130 and about 2 hours after meal is 130-160  Please bring your blood sugar monitor to each visit, thank you   

## 2020-02-11 ENCOUNTER — Other Ambulatory Visit: Payer: Self-pay

## 2020-02-11 ENCOUNTER — Other Ambulatory Visit (INDEPENDENT_AMBULATORY_CARE_PROVIDER_SITE_OTHER): Payer: 59

## 2020-02-11 DIAGNOSIS — E1165 Type 2 diabetes mellitus with hyperglycemia: Secondary | ICD-10-CM | POA: Diagnosis not present

## 2020-02-11 DIAGNOSIS — E782 Mixed hyperlipidemia: Secondary | ICD-10-CM | POA: Diagnosis not present

## 2020-02-11 LAB — LIPID PANEL
Cholesterol: 162 mg/dL (ref 0–200)
HDL: 51.1 mg/dL (ref >39.00)
LDL Cholesterol: 96 mg/dL (ref 0–99)
NonHDL: 110.89
Total CHOL/HDL Ratio: 3
Triglycerides: 73 mg/dL (ref 0.0–149.0)
VLDL: 14.6 mg/dL (ref 0.0–40.0)

## 2020-02-11 LAB — COMPREHENSIVE METABOLIC PANEL
ALT: 14 U/L (ref 0–53)
AST: 13 U/L (ref 0–37)
Albumin: 4.4 g/dL (ref 3.5–5.2)
Alkaline Phosphatase: 29 U/L — ABNORMAL LOW (ref 39–117)
BUN: 17 mg/dL (ref 6–23)
CO2: 31 mEq/L (ref 19–32)
Calcium: 9.6 mg/dL (ref 8.4–10.5)
Chloride: 101 mEq/L (ref 96–112)
Creatinine, Ser: 1.38 mg/dL (ref 0.40–1.50)
GFR: 52.73 mL/min — ABNORMAL LOW (ref >60.00)
Glucose, Bld: 116 mg/dL — ABNORMAL HIGH (ref 70–99)
Potassium: 4.7 mEq/L (ref 3.5–5.1)
Sodium: 137 mEq/L (ref 135–145)
Total Bilirubin: 0.9 mg/dL (ref 0.2–1.2)
Total Protein: 7.1 g/dL (ref 6.0–8.3)

## 2020-02-11 LAB — MICROALBUMIN / CREATININE URINE RATIO
Creatinine,U: 131.3 mg/dL
Microalb Creat Ratio: 0.5 mg/g (ref 0.0–30.0)
Microalb, Ur: <0.7 mg/dL (ref 0.0–1.9)

## 2020-02-11 LAB — HEMOGLOBIN A1C: Hgb A1c MFr Bld: 7.4 % — ABNORMAL HIGH (ref 4.6–6.5)

## 2020-02-15 NOTE — Progress Notes (Signed)
Patient ID: Ricardo [name], male   DOB: 1950-08-18, 69 y.o.   MRN: [id number]           Reason for Appointment: Follow-up for Type 2 Diabetes   Referring physician:  [name]   History of Present Illness:          Diagnosis: Type 2 diabetes mellitus, date of diagnosis: 2009       Past history: At diagnosis he was having symptoms of fatigue.  He was started on glipizide initially and subsequently metformin was added.  Further details are not available However he has been somewhat irregular with his follow-up and did not have an A1c with his PCP between 8/13 and 11/15 He was referred here because of rising A1c of 7.5 He had been started on Trulicity in 6/28 because of gradually increasing A1c He stopped taking Trulicity in 3662 because of out-of-pocket expense  Recent history:     Oral hypoglycemic drugs : Trulicity 1.5 mg weekly, glipizide ER 2.5 mg, metformin 1 g twice a day, Actos 15 mg daily     His A1c is 7.4  Previously has been as low as 6.8 and highest 9.1  Current blood sugar patterns, problems identified and management:  He has for the last few weeks try to do well with cutting back on carbohydrates and portions  Also recently is trying to do a combination of weights and cardio exercises at home  Generally has had good blood sugar control and usually checking blood sugars at various times of the day  FASTING blood sugars are minimally higher  Has been on 2.5 mg glipizide ER and lowest blood sugar recently is about 70 in the evening  In the last few days he has been eating more sweets on occasion and highest blood sugar 268  No side effects with Trulicity  Although his weight has fluctuated some his BMI still is fairly good at 26       Side effects from medications have been: None Compliance with the medical regimen: Fair   Glucose monitoring:  done usually one time a day         Glucometer:  [place]  Blood Glucose readings reviewed from monitor meter  fax information  Before meals average 136 and afternoon average 142  PRE-MEAL Fasting Lunch Dinner Bedtime Overall  Glucose range:  110-149   70, 268    Mean/median:     137   POST-MEAL PC Breakfast PC Lunch PC Dinner  Glucose range:   143, 225  92-156  Mean/median:      PREVIOUSLY  AVERAGE before meals 126, after meals 147  PRE-MEAL Fasting Lunch Dinner Bedtime Overall  Glucose range:  108-166   118    Mean/median:      139   POST-MEAL PC Breakfast PC Lunch PC Dinner  Glucose range:   146, 192 ?  Mean/median:      Self-care:  Meals: 3 meals per day. Breakfast is oatmeal/cereal/eggs and toast.  Has half sandwich or soup at lunch, avoiding rice and carbs usually        Dietician visit, most recent: at diagnosis               Weight history:   Wt Readings from Last 3 Encounters:  02/16/20 161 lb 6.4 oz (73.2 kg)  11/13/19 157 lb 6.4 oz (71.4 kg)  09/10/19 159 lb 6 oz (72.3 kg)    Glycemic control:   Lab Results  Component Value Date  HGBA1C 7.4 (H) 02/11/2020   HGBA1C 7.6 (H) 11/10/2019   HGBA1C 8.1 (H) 06/15/2019   Lab Results  Component Value Date   MICROALBUR <0.7 02/11/2020   LDLCALC 96 02/11/2020   CREATININE 1.38 02/11/2020   Lab on 02/11/2020  Component Date Value Ref Range Status  . Cholesterol 02/11/2020 162  0 - 200 mg/dL Final   ATP III Classification       Desirable:  < 200 mg/dL               Borderline High:  200 - 239 mg/dL          High:  > = 240 mg/dL  . Triglycerides 02/11/2020 73.0  0.0 - 149.0 mg/dL Final   Normal:  <150 mg/dLBorderline High:  150 - 199 mg/dL  . HDL 02/11/2020 51.10  >39.00 mg/dL Final  . VLDL 02/11/2020 14.6  0.0 - 40.0 mg/dL Final  . LDL Cholesterol 02/11/2020 96  0 - 99 mg/dL Final  . Total CHOL/HDL Ratio 02/11/2020 3   Final                  Men          Women1/2 Average Risk     3.4          3.3Average Risk          5.0          4.42X Average Risk          9.6          7.13X Average Risk          15.0          11.0                       . NonHDL 02/11/2020 110.89   Final   NOTE:  Non-HDL goal should be 30 mg/dL higher than patient's LDL goal (i.e. LDL goal of < 70 mg/dL, would have non-HDL goal of < 100 mg/dL)  . Microalb, Ur 02/11/2020 <0.7  0.0 - 1.9 mg/dL Final  . Creatinine,U 02/11/2020 131.3  mg/dL Final  . Microalb Creat Ratio 02/11/2020 0.5  0.0 - 30.0 mg/g Final  . Sodium 02/11/2020 137  135 - 145 mEq/L Final  . Potassium 02/11/2020 4.7  3.5 - 5.1 mEq/L Final  . Chloride 02/11/2020 101  96 - 112 mEq/L Final  . CO2 02/11/2020 31  19 - 32 mEq/L Final  . Glucose, Bld 02/11/2020 116* 70 - 99 mg/dL Final  . BUN 02/11/2020 17  6 - 23 mg/dL Final  . Creatinine, Ser 02/11/2020 1.38  0.40 - 1.50 mg/dL Final  . Total Bilirubin 02/11/2020 0.9  0.2 - 1.2 mg/dL Final  . Alkaline Phosphatase 02/11/2020 29* 39 - 117 U/L Final  . AST 02/11/2020 13  0 - 37 U/L Final  . ALT 02/11/2020 14  0 - 53 U/L Final  . Total Protein 02/11/2020 7.1  6.0 - 8.3 g/dL Final  . Albumin 02/11/2020 4.4  3.5 - 5.2 g/dL Final  . GFR 02/11/2020 52.73* >60.00 mL/min Final   Calculated using the CKD-EPI Creatinine Equation (2021)  . Calcium 02/11/2020 9.6  8.4 - 10.5 mg/dL Final  . Hgb A1c MFr Bld 02/11/2020 7.4* 4.6 - 6.5 % Final   Glycemic Control Guidelines for People with Diabetes:Non Diabetic:  <6%Goal of Therapy: <7%Additional Action Suggested:  >8%      Allergies as of 02/16/2020   No Known  Allergies     Medication List       Accurate as of February 16, 2020  9:01 AM. If you have any questions, ask your nurse or doctor.        alfuzosin 10 MG 24 hr tablet Commonly known as: UROXATRAL Take 10 mg by mouth daily with breakfast.   aspirin 81 MG tablet Take 81 mg by mouth daily.   atorvastatin 20 MG tablet Commonly known as: LIPITOR TAKE 1 TABLET (20 MG TOTAL) BY MOUTH DAILY.   dutasteride 0.5 MG capsule Commonly known as: AVODART Take 0.5 mg by mouth daily.   glipiZIDE 2.5 MG 24 hr tablet Commonly known  as: GLUCOTROL XL TAKE 1 TABLET BY MOUTH EVERY DAY WITH BREAKFAST   levothyroxine 100 MCG tablet Commonly known as: SYNTHROID TAKE 1 TABLET BY MOUTH EVERY DAY   losartan 100 MG tablet Commonly known as: COZAAR TAKE 1 TABLET BY MOUTH ONCE DAILY   metFORMIN 1000 MG tablet Commonly known as: GLUCOPHAGE TAKE 1 TABLET BY MOUTH TWICE A DAY   pioglitazone 15 MG tablet Commonly known as: ACTOS Take 1 tablet (15 mg total) by mouth daily.   Trulicity 1.5 KG/2.5KY Sopn Generic drug: Dulaglutide Inject weekly   valACYclovir 500 MG tablet Commonly known as: VALTREX TAKE 1 TABLET (500 MG TOTAL) BY MOUTH DAILY. What changed:   when to take this  reasons to take this       Allergies: No Known Allergies  Past Medical History:  Diagnosis Date  . ARTHRITIS, HIP 08/10/2008  . BURSITIS, RIGHT SHOULDER 05/20/2008  . Degeneration of lumbar or lumbosacral intervertebral disc 11/27/2006  . DEGENERATION, CERVICAL DISC 11/27/2006  . Degenerative disc disease, lumbar   . Depression    no per pt 03-30-19  . DEPRESSION 11/25/2006  . Diabetes mellitus   . DIABETES MELLITUS, TYPE II 04/10/2007  . Epicondylitis    bilateral  . Fatigue   . HERPES GENITALIS 09/17/2007  . Hyperlipemia   . HYPERLIPIDEMIA 03/31/2007  . Hypertension   . HYPERTENSION 11/25/2006  . HYPOTHYROIDISM 10/28/2007  . Impaired fasting glucose 12/26/2006  . Lateral epicondylitis  of elbow 05/05/2007  . Sciatica 10/28/2007  . SHOULDER PAIN, RIGHT 08/18/2008    Past Surgical History:  Procedure Laterality Date  . ACHILLES TENDON REPAIR     left  . COLONOSCOPY    . ELBOW SURGERY  2005   left  . herniated lumbar disc  1997, 2011  . KNEE ARTHROSCOPY  2007   left  . ROTATOR CUFF REPAIR  1997   right    Family History  Problem Relation Age of Onset  . Diabetes Mother   . Heart disease Mother   . Heart disease Father   . Diabetes Sister   . Colon cancer Neg Hx   . Esophageal cancer Neg Hx   . Rectal cancer Neg Hx   .  Stomach cancer Neg Hx     Social History:  reports that he has never smoked. He has never used smokeless tobacco. He reports current alcohol use of about 3.0 standard drinks of alcohol per week. He reports that he does not use drugs.    Review of Systems         Lipids: He has been treated with Lipitor 20 mg  With taking this regularly LDL is controlled       Lab Results  Component Value Date   CHOL 162 02/11/2020   HDL 51.10 02/11/2020   LDLCALC 96 02/11/2020  LDLDIRECT 79.0 07/23/2018   TRIG 73.0 02/11/2020   CHOLHDL 3 02/11/2020                   Thyroid: He has been on Synthroid for several years years, initially had symptoms of fatigue at diagnosis He is on 100 mcg  Labs consistently normal  Lab Results  Component Value Date   TSH 3.00 11/10/2019   TSH 3.21 03/12/2019   TSH 4.11 12/04/2018     Followed by pulmonologist for pulmonary fibrosis   HYPERTENSION: He has missed his doses of losartan the last few days He thinks blood pressure usually normal at home  BP Readings from Last 3 Encounters:  02/16/20 (!) 164/92  11/13/19 122/72  09/10/19 140/72   Is asking about a mild discomfort and tenderness in the left upper neck   LABS:  Lab on 02/11/2020  Component Date Value Ref Range Status  . Cholesterol 02/11/2020 162  0 - 200 mg/dL Final   ATP III Classification       Desirable:  < 200 mg/dL               Borderline High:  200 - 239 mg/dL          High:  > = 240 mg/dL  . Triglycerides 02/11/2020 73.0  0.0 - 149.0 mg/dL Final   Normal:  <150 mg/dLBorderline High:  150 - 199 mg/dL  . HDL 02/11/2020 51.10  >39.00 mg/dL Final  . VLDL 02/11/2020 14.6  0.0 - 40.0 mg/dL Final  . LDL Cholesterol 02/11/2020 96  0 - 99 mg/dL Final  . Total CHOL/HDL Ratio 02/11/2020 3   Final                  Men          Women1/2 Average Risk     3.4          3.3Average Risk          5.0          4.42X Average Risk          9.6          7.13X Average Risk          15.0           11.0                      . NonHDL 02/11/2020 110.89   Final   NOTE:  Non-HDL goal should be 30 mg/dL higher than patient's LDL goal (i.e. LDL goal of < 70 mg/dL, would have non-HDL goal of < 100 mg/dL)  . Microalb, Ur 02/11/2020 <0.7  0.0 - 1.9 mg/dL Final  . Creatinine,U 02/11/2020 131.3  mg/dL Final  . Microalb Creat Ratio 02/11/2020 0.5  0.0 - 30.0 mg/g Final  . Sodium 02/11/2020 137  135 - 145 mEq/L Final  . Potassium 02/11/2020 4.7  3.5 - 5.1 mEq/L Final  . Chloride 02/11/2020 101  96 - 112 mEq/L Final  . CO2 02/11/2020 31  19 - 32 mEq/L Final  . Glucose, Bld 02/11/2020 116* 70 - 99 mg/dL Final  . BUN 02/11/2020 17  6 - 23 mg/dL Final  . Creatinine, Ser 02/11/2020 1.38  0.40 - 1.50 mg/dL Final  . Total Bilirubin 02/11/2020 0.9  0.2 - 1.2 mg/dL Final  . Alkaline Phosphatase 02/11/2020 29* 39 - 117 U/L Final  . AST 02/11/2020 13  0 - 37 U/L Final  .  ALT 02/11/2020 14  0 - 53 U/L Final  . Total Protein 02/11/2020 7.1  6.0 - 8.3 g/dL Final  . Albumin 02/11/2020 4.4  3.5 - 5.2 g/dL Final  . GFR 02/11/2020 52.73* >60.00 mL/min Final   Calculated using the CKD-EPI Creatinine Equation (2021)  . Calcium 02/11/2020 9.6  8.4 - 10.5 mg/dL Final  . Hgb A1c MFr Bld 02/11/2020 7.4* 4.6 - 6.5 % Final   Glycemic Control Guidelines for People with Diabetes:Non Diabetic:  <6%Goal of Therapy: <7%Additional Action Suggested:  >8%     Physical Examination:  BP (!) 164/92   Pulse 69   Ht 5\' 6"  (1.676 m)   Wt 161 lb 6.4 oz (73.2 kg)   SpO2 95%   BMI 26.05 kg/m          Diabetic Foot Exam - Simple   Simple Foot Form Diabetic Foot exam was performed with the following findings: Yes   Visual Inspection No deformities, no ulcerations, no other skin breakdown bilaterally: Yes Sensation Testing Intact to touch and monofilament testing bilaterally: Yes Pulse Check Posterior Tibialis and Dorsalis pulse intact bilaterally: Yes Comments Mild decrease in sensation in the right fourth and fifth  toes Mild decrease in right posterior tibialis pulse    No lymphadenopathy in the neck on the left, no thyroid enlargement     ASSESSMENT:  Diabetes type 2, nonobese.   See history of present illness for detailed discussion of his current management, blood sugar patterns and problems identified  A1c has improved to 7.4, previously 7.6  More recently has been usually watching diet and exercise regimen which helps his control Only in the last few days his blood sugars are higher from eating sweets  Previously no consistent pattern of high readings seen Usually trying to cut back on carbohydrates with most meals, may not always get protein at breakfast  Left neck pain: May have mild carotidynia and he will follow up with PCP  Hypertension: Previously controlled, now relatively higher from missing his losartan doses and possibly whitecoat syndrome  Hypothyroidism: Adequately replaced previously and will recheck on the next visit  He has not had the Covid vaccine and is still unsure about this  PLAN:  No change in medication regimen as above Encouraged him to check his feet regularly  He will try to take his losartan at dinnertime instead of later in the evening when he is forgetting Needs to make a follow-up with his PCP for general care  Reassured him about the safety of the Covid vaccine and encouraged him to take this   Follow-up in 4 months   Patient Instructions  Check blood sugars on waking up 2-3 days a week  Also check blood sugars about 2 hours after meals and do this after different meals by rotation  Recommended blood sugar levels on waking up are 90-130 and about 2 hours after meal is 130-180  Please bring your blood sugar monitor to each visit, thank you  Take Losartan at supper   Elayne Snare 02/16/2020, 9:01 AM   Note: This office note was prepared with Dragon voice recognition system technology. Any transcriptional errors that result from this  process are unintentional.

## 2020-02-16 ENCOUNTER — Other Ambulatory Visit: Payer: Self-pay | Admitting: Internal Medicine

## 2020-02-16 ENCOUNTER — Ambulatory Visit (INDEPENDENT_AMBULATORY_CARE_PROVIDER_SITE_OTHER): Payer: 59 | Admitting: Endocrinology

## 2020-02-16 ENCOUNTER — Other Ambulatory Visit: Payer: Self-pay

## 2020-02-16 VITALS — BP 164/92 | HR 69 | Ht 66.0 in | Wt 161.4 lb

## 2020-02-16 DIAGNOSIS — E1165 Type 2 diabetes mellitus with hyperglycemia: Secondary | ICD-10-CM

## 2020-02-16 DIAGNOSIS — E785 Hyperlipidemia, unspecified: Secondary | ICD-10-CM | POA: Diagnosis not present

## 2020-02-16 DIAGNOSIS — Z7185 Encounter for immunization safety counseling: Secondary | ICD-10-CM

## 2020-02-16 DIAGNOSIS — I10 Essential (primary) hypertension: Secondary | ICD-10-CM | POA: Diagnosis not present

## 2020-02-16 DIAGNOSIS — E063 Autoimmune thyroiditis: Secondary | ICD-10-CM

## 2020-02-16 NOTE — Patient Instructions (Signed)
Check blood sugars on waking up 2-3 days a week  Also check blood sugars about 2 hours after meals and do this after different meals by rotation  Recommended blood sugar levels on waking up are 90-130 and about 2 hours after meal is 130-180  Please bring your blood sugar monitor to each visit, thank you  Take Losartan at supper

## 2020-03-18 ENCOUNTER — Telehealth: Payer: Self-pay | Admitting: Internal Medicine

## 2020-03-18 NOTE — Telephone Encounter (Signed)
    Patient calling to report he tested positive  COVID 03/18/20 Patient seeking advice for cough, headache, dizziness, mild sinus drainage    Seeking advice

## 2020-03-18 NOTE — Telephone Encounter (Signed)
For a mild COVID-19 case you can take zinc 50 mg a day for 1 week, vitamin C 1000 mg daily for 1 week, vitamin D2 50,000 units weekly for 2 months, Quercetin 500 mg twice a day for 1 week.  Maintain good oral hydration and take Tylenol with high fever.

## 2020-03-18 NOTE — Telephone Encounter (Signed)
Notified pt w/MD response.../lmb 

## 2020-03-18 NOTE — Telephone Encounter (Signed)
    Patient calling with questions regarding all natural home remedies using lemon peels and grapefruits

## 2020-03-21 ENCOUNTER — Other Ambulatory Visit: Payer: Self-pay | Admitting: Endocrinology

## 2020-03-31 ENCOUNTER — Encounter: Payer: Self-pay | Admitting: Internal Medicine

## 2020-03-31 LAB — HM DIABETES EYE EXAM

## 2020-04-01 ENCOUNTER — Encounter: Payer: Self-pay | Admitting: *Deleted

## 2020-05-13 ENCOUNTER — Other Ambulatory Visit: Payer: Self-pay | Admitting: Endocrinology

## 2020-05-13 DIAGNOSIS — E1165 Type 2 diabetes mellitus with hyperglycemia: Secondary | ICD-10-CM

## 2020-05-19 ENCOUNTER — Other Ambulatory Visit: Payer: 59

## 2020-05-23 ENCOUNTER — Telehealth: Payer: Self-pay | Admitting: *Deleted

## 2020-05-23 NOTE — Telephone Encounter (Signed)
Patient called back stating his insurance covers the One Touch Verio. Ph# [phone]

## 2020-05-23 NOTE — Telephone Encounter (Signed)
Pt requesting for meter, test strip, and lancets. Questioning the pt which meter is cover by insurance. Pt will call the insurance and will call the office back with this info.

## 2020-05-24 ENCOUNTER — Other Ambulatory Visit: Payer: Self-pay | Admitting: *Deleted

## 2020-05-24 DIAGNOSIS — E1165 Type 2 diabetes mellitus with hyperglycemia: Secondary | ICD-10-CM

## 2020-05-24 MED ORDER — ONETOUCH VERIO W/DEVICE KIT: PACK | 0 refills | Status: AC

## 2020-05-24 MED ORDER — GLUCOSE BLOOD VI STRP: ORAL_STRIP | 12 refills | Status: DC

## 2020-05-24 MED ORDER — ONETOUCH ULTRASOFT LANCETS MISC: 12 refills | Status: DC

## 2020-05-24 NOTE — Telephone Encounter (Signed)
Patient returned Ricardo [name]'s call and requests to be called at ph# [phone]

## 2020-05-24 NOTE — Telephone Encounter (Signed)
Sent Rx to [facility]

## 2020-05-25 ENCOUNTER — Ambulatory Visit: Payer: 59 | Admitting: Endocrinology

## 2020-05-26 ENCOUNTER — Other Ambulatory Visit: Payer: Self-pay | Admitting: Endocrinology

## 2020-05-26 DIAGNOSIS — E1165 Type 2 diabetes mellitus with hyperglycemia: Secondary | ICD-10-CM

## 2020-05-30 ENCOUNTER — Other Ambulatory Visit: Payer: Self-pay | Admitting: *Deleted

## 2020-05-30 DIAGNOSIS — E1165 Type 2 diabetes mellitus with hyperglycemia: Secondary | ICD-10-CM

## 2020-05-30 MED ORDER — ONETOUCH ULTRASOFT LANCETS MISC: 12 refills | Status: AC

## 2020-05-30 MED ORDER — GLUCOSE BLOOD VI STRP: ORAL_STRIP | 12 refills | Status: DC

## 2020-06-09 ENCOUNTER — Other Ambulatory Visit: Payer: Self-pay

## 2020-06-09 ENCOUNTER — Other Ambulatory Visit (INDEPENDENT_AMBULATORY_CARE_PROVIDER_SITE_OTHER): Payer: 59

## 2020-06-09 DIAGNOSIS — E063 Autoimmune thyroiditis: Secondary | ICD-10-CM | POA: Diagnosis not present

## 2020-06-09 DIAGNOSIS — E1165 Type 2 diabetes mellitus with hyperglycemia: Secondary | ICD-10-CM

## 2020-06-09 LAB — HEMOGLOBIN A1C: Hgb A1c MFr Bld: 7.4 % — ABNORMAL HIGH (ref 4.6–6.5)

## 2020-06-09 LAB — BASIC METABOLIC PANEL
BUN: 14 mg/dL (ref 6–23)
CO2: 29 mEq/L (ref 19–32)
Calcium: 10.1 mg/dL (ref 8.4–10.5)
Chloride: 101 mEq/L (ref 96–112)
Creatinine, Ser: 1.26 mg/dL (ref 0.40–1.50)
GFR: 58.68 mL/min — ABNORMAL LOW (ref >60.00)
Glucose, Bld: 140 mg/dL — ABNORMAL HIGH (ref 70–99)
Potassium: 4.5 mEq/L (ref 3.5–5.1)
Sodium: 139 mEq/L (ref 135–145)

## 2020-06-09 LAB — TSH: TSH: 2.56 u[IU]/mL (ref 0.35–4.50)

## 2020-06-10 ENCOUNTER — Other Ambulatory Visit: Payer: 59

## 2020-06-14 NOTE — Progress Notes (Signed)
Patient ID: Ricardo [name], male   DOB: May 28, 1950, 70 y.o.   MRN: [id number]           Reason for Appointment: Follow-up    Referring physician:  [name]   History of Present Illness:          Diagnosis: Type 2 diabetes mellitus, date of diagnosis: 2009       Past history: At diagnosis he was having symptoms of fatigue.  He was started on glipizide initially and subsequently metformin was added.  Further details are not available However he has been somewhat irregular with his follow-up and did not have an A1c with his PCP between 8/13 and 11/15 He was referred here because of rising A1c of 7.5 He had been started on Trulicity in 3/41 because of gradually increasing A1c He stopped taking Trulicity in 9379 because of out-of-pocket expense  Recent history:     Oral hypoglycemic drugs : Trulicity 1.5 mg weekly, glipizide ER 2.5 mg, metformin 1 g twice a day, Actos 15 mg daily     His A1c is 7.4 again  Previously has been as low as 6.8 and highest 9.1  Current blood sugar patterns, problems identified and management:  He feels that he has been going off his diet in the last month or so because of social gatherings  Again he feels that he has difficulty controlling his snacking given with Trulicity and even when he is not hungry  Still using the [employer] meter but usually forgetting to check readings after dinner  Weight has gone up  As before is trying to do a combination of weights and cardio exercises at home  Generally has had good blood sugar control and usually checking blood sugars at various times of the day  FASTING blood sugars are mildly increased but 140 in the lab  Has only occasional readings over 200 later in the day       Side effects from medications have been: None Compliance with the medical regimen: Fair   Glucose monitoring:  done usually one time a day         Glucometer:  [employer]ivongo  Blood Glucose readings reviewed from monitor meter fax  information   PRE-MEAL Fasting Lunch Dinner Bedtime Overall  Glucose range:  122-134  124, 142  96-210  94-221  Mean/median:     145   Previously: Before meals average 136 and afternoon average 142  PRE-MEAL Fasting Lunch Dinner Bedtime Overall  Glucose range:  110-149   70, 268    Mean/median:     137   POST-MEAL PC Breakfast PC Lunch PC Dinner  Glucose range:   143, 225  92-156  Mean/median:       Self-care:  Meals: 3 meals per day. Breakfast is oatmeal/cereal/eggs and toast.  Has half sandwich or soup at lunch, avoiding rice and carbs usually        Dietician visit, most recent: at diagnosis               Weight history:   Wt Readings from Last 3 Encounters:  06/15/20 165 lb (74.8 kg)  02/16/20 161 lb 6.4 oz (73.2 kg)  11/13/19 157 lb 6.4 oz (71.4 kg)    Glycemic control:   Lab Results  Component Value Date   HGBA1C 7.4 (H) 06/09/2020   HGBA1C 7.4 (H) 02/11/2020   HGBA1C 7.6 (H) 11/10/2019   Lab Results  Component Value Date   MICROALBUR <0.7 02/11/2020   [facility] 96  02/11/2020   CREATININE 1.26 06/09/2020   Lab on 06/09/2020  Component Date Value Ref Range Status  . TSH 06/09/2020 2.56  0.35 - 4.50 uIU/mL Final  . Sodium 06/09/2020 139  135 - 145 mEq/L Final  . Potassium 06/09/2020 4.5  3.5 - 5.1 mEq/L Final  . Chloride 06/09/2020 101  96 - 112 mEq/L Final  . CO2 06/09/2020 29  19 - 32 mEq/L Final  . Glucose, Bld 06/09/2020 140* 70 - 99 mg/dL Final  . BUN 06/09/2020 14  6 - 23 mg/dL Final  . Creatinine, Ser 06/09/2020 1.26  0.40 - 1.50 mg/dL Final  . GFR 06/09/2020 58.68* >60.00 mL/min Final   Calculated using the CKD-EPI Creatinine Equation (2021)  . Calcium 06/09/2020 10.1  8.4 - 10.5 mg/dL Final  . Hgb A1c MFr Bld 06/09/2020 7.4* 4.6 - 6.5 % Final   Glycemic Control Guidelines for People with Diabetes:Non Diabetic:  <6%Goal of Therapy: <7%Additional Action Suggested:  >8%      Allergies as of 06/15/2020   No Known Allergies     Medication List        Accurate as of June 15, 2020  8:25 AM. If you have any questions, ask your nurse or doctor.        alfuzosin 10 MG 24 hr tablet Commonly known as: UROXATRAL Take 10 mg by mouth daily with breakfast.   aspirin 81 MG tablet Take 81 mg by mouth daily.   atorvastatin 20 MG tablet Commonly known as: LIPITOR Take 1 tablet (20 mg total) by mouth daily. Keep scheduled appt for future refills   dutasteride 0.5 MG capsule Commonly known as: AVODART Take 0.5 mg by mouth daily.   glipiZIDE 2.5 MG 24 hr tablet Commonly known as: GLUCOTROL XL TAKE 1 TABLET BY MOUTH EVERY DAY WITH BREAKFAST   glucose blood test strip Use as instructed. .Check sugar 3-4 times daily. E11.65   levothyroxine 100 MCG tablet Commonly known as: SYNTHROID TAKE 1 TABLET BY MOUTH EVERY DAY   losartan 100 MG tablet Commonly known as: COZAAR TAKE 1 TABLET BY MOUTH ONCE DAILY   metFORMIN 1000 MG tablet Commonly known as: GLUCOPHAGE TAKE 1 TABLET BY MOUTH TWICE A DAY   onetouch ultrasoft lancets Use as instructed.Check sugar 3-4 times daily. E11.65   OneTouch Verio w/Device Kit Check sugar 3-4 times daily. E11.65   pioglitazone 15 MG tablet Commonly known as: ACTOS TAKE 1 TABLET BY MOUTH EVERY DAY   Trulicity 1.5 ZJ/0.9UK Sopn Generic drug: Dulaglutide INJECT WEEKLY AS DIRECTED   valACYclovir 500 MG tablet Commonly known as: VALTREX TAKE 1 TABLET (500 MG TOTAL) BY MOUTH DAILY. What changed:   when to take this  reasons to take this       Allergies: No Known Allergies  Past Medical History:  Diagnosis Date  . ARTHRITIS, HIP 08/10/2008  . BURSITIS, RIGHT SHOULDER 05/20/2008  . Degeneration of lumbar or lumbosacral intervertebral disc 11/27/2006  . DEGENERATION, CERVICAL DISC 11/27/2006  . Degenerative disc disease, lumbar   . Depression    no per pt 03-30-19  . DEPRESSION 11/25/2006  . Diabetes mellitus   . DIABETES MELLITUS, TYPE II 04/10/2007  . Epicondylitis    bilateral  .  Fatigue   . HERPES GENITALIS 09/17/2007  . Hyperlipemia   . HYPERLIPIDEMIA 03/31/2007  . Hypertension   . HYPERTENSION 11/25/2006  . HYPOTHYROIDISM 10/28/2007  . Impaired fasting glucose 12/26/2006  . Lateral epicondylitis  of elbow 05/05/2007  . Sciatica 10/28/2007  .  SHOULDER PAIN, RIGHT 08/18/2008    Past Surgical History:  Procedure Laterality Date  . ACHILLES TENDON REPAIR     left  . COLONOSCOPY    . ELBOW SURGERY  2005   left  . herniated lumbar disc  1997, 2011  . KNEE ARTHROSCOPY  2007   left  . ROTATOR CUFF REPAIR  1997   right    Family History  Problem Relation Age of Onset  . Diabetes Mother   . Heart disease Mother   . Heart disease Father   . Diabetes Sister   . Colon cancer Neg Hx   . Esophageal cancer Neg Hx   . Rectal cancer Neg Hx   . Stomach cancer Neg Hx     Social History:  reports that he has never smoked. He has never used smokeless tobacco. He reports current alcohol use of about 3.0 standard drinks of alcohol per week. He reports that he does not use drugs.    Review of Systems         Lipids: He has been treated with Lipitor 20 mg  With taking this regularly LDL is controlled       Lab Results  Component Value Date   CHOL 162 02/11/2020   HDL 51.10 02/11/2020   LDLCALC 96 02/11/2020   LDLDIRECT 79.0 07/23/2018   TRIG 73.0 02/11/2020   CHOLHDL 3 02/11/2020                   Thyroid: He has been on Synthroid for several years years, initially had symptoms of fatigue at diagnosis He is on 100 mcg  TSH consistently normal  Lab Results  Component Value Date   TSH 2.56 06/09/2020   TSH 3.00 11/10/2019   TSH 3.21 03/12/2019     Followed by pulmonologist for pulmonary fibrosis   HYPERTENSION: Blood pressure appears to be consistently high He thinks blood pressure usually normal at home and he thinks he has whitecoat syndrome, recently not checking regularly as his meter is giving error messages  BP Readings from Last 3  Encounters:  06/15/20 (!) 150/100  02/16/20 (!) 164/92  11/13/19 122/72       LABS:  Lab on 06/09/2020  Component Date Value Ref Range Status  . TSH 06/09/2020 2.56  0.35 - 4.50 uIU/mL Final  . Sodium 06/09/2020 139  135 - 145 mEq/L Final  . Potassium 06/09/2020 4.5  3.5 - 5.1 mEq/L Final  . Chloride 06/09/2020 101  96 - 112 mEq/L Final  . CO2 06/09/2020 29  19 - 32 mEq/L Final  . Glucose, Bld 06/09/2020 140* 70 - 99 mg/dL Final  . BUN 06/09/2020 14  6 - 23 mg/dL Final  . Creatinine, Ser 06/09/2020 1.26  0.40 - 1.50 mg/dL Final  . GFR 06/09/2020 58.68* >60.00 mL/min Final   Calculated using the CKD-EPI Creatinine Equation (2021)  . Calcium 06/09/2020 10.1  8.4 - 10.5 mg/dL Final  . Hgb A1c MFr Bld 06/09/2020 7.4* 4.6 - 6.5 % Final   Glycemic Control Guidelines for People with Diabetes:Non Diabetic:  <6%Goal of Therapy: <7%Additional Action Suggested:  >8%        Physical Examination:  BP (!) 150/100   Pulse 81   Ht $R'5\' 6"'Bs$  (1.676 m)   Wt 165 lb (74.8 kg)   SpO2 95%   BMI 26.63 kg/m              ASSESSMENT:  Diabetes type 2, nonobese.   See history  of present illness for detailed discussion of his current management, blood sugar patterns and problems identified  A1c has not improved further, again 7.4   As discussed above he can do better with his diet He does not feel that he can control his snacking consistently Also usually is away from home during the summer months May benefit from going up on the Trulicity to 3 mg both with increasing satiety and better control We will try to do more readings after dinner also   Hypertension: Not well controlled, now relatively higher but he thinks this is from whitecoat syndrome  Hypothyroidism: Adequately replaced previously Coming about about 5   PLAN:  Trulicity 3 mg weekly He will try to cut back on snacks especially carbohydrates and sweets  He will try to take his losartan at dinnertime instead of later in  the evening Advised him to call his PCP to review his blood pressure control He will start checking his blood pressure more regularly with the instrument that works  To call if blood sugars are running consistently higher  Follow-up in 4 months   There are no Patient Instructions on file for this visit.  Elayne Snare 06/15/2020, 8:25 AM   Note: This office note was prepared with Dragon voice recognition system technology. Any transcriptional errors that result from this process are unintentional.

## 2020-06-15 ENCOUNTER — Ambulatory Visit: Payer: 59 | Admitting: Endocrinology

## 2020-06-15 ENCOUNTER — Other Ambulatory Visit: Payer: Self-pay

## 2020-06-15 ENCOUNTER — Encounter: Payer: Self-pay | Admitting: Endocrinology

## 2020-06-15 VITALS — BP 150/100 | HR 81 | Ht 66.0 in | Wt 165.0 lb

## 2020-06-15 DIAGNOSIS — E1165 Type 2 diabetes mellitus with hyperglycemia: Secondary | ICD-10-CM

## 2020-06-15 DIAGNOSIS — E063 Autoimmune thyroiditis: Secondary | ICD-10-CM

## 2020-06-15 DIAGNOSIS — I10 Essential (primary) hypertension: Secondary | ICD-10-CM

## 2020-06-15 MED ORDER — TRULICITY 3 MG/0.5ML ~~LOC~~ SOAJ: 3.0000 mg | SUBCUTANEOUS | 1 refills | Status: DC

## 2020-06-15 NOTE — Patient Instructions (Addendum)
Check blood sugars on waking up 2-3 days a week  Also check blood sugars about 2 hours after meals and do this after different meals by rotation  Recommended blood sugar levels on waking up are 90-130 and about 2 hours after meal is 130-160  Please bring your blood sugar monitor to each visit, thank you  Take Losartan at dinner  Call if BP stays high

## 2020-07-28 ENCOUNTER — Other Ambulatory Visit: Payer: Self-pay | Admitting: Endocrinology

## 2020-07-29 ENCOUNTER — Other Ambulatory Visit: Payer: Self-pay | Admitting: Internal Medicine

## 2020-08-25 ENCOUNTER — Other Ambulatory Visit: Payer: Self-pay | Admitting: Internal Medicine

## 2020-09-29 ENCOUNTER — Other Ambulatory Visit: Payer: Self-pay | Admitting: Internal Medicine

## 2020-11-04 ENCOUNTER — Other Ambulatory Visit: Payer: 59

## 2020-11-08 ENCOUNTER — Ambulatory Visit: Payer: 59 | Admitting: Endocrinology

## 2020-12-09 ENCOUNTER — Other Ambulatory Visit (INDEPENDENT_AMBULATORY_CARE_PROVIDER_SITE_OTHER): Payer: 59

## 2020-12-09 ENCOUNTER — Other Ambulatory Visit: Payer: Self-pay

## 2020-12-09 DIAGNOSIS — E1165 Type 2 diabetes mellitus with hyperglycemia: Secondary | ICD-10-CM | POA: Diagnosis not present

## 2020-12-09 LAB — HEMOGLOBIN A1C: Hgb A1c MFr Bld: 7.6 % — ABNORMAL HIGH (ref 4.6–6.5)

## 2020-12-09 LAB — COMPREHENSIVE METABOLIC PANEL
ALT: 11 U/L (ref 0–53)
AST: 14 U/L (ref 0–37)
Albumin: 4.2 g/dL (ref 3.5–5.2)
Alkaline Phosphatase: 35 U/L — ABNORMAL LOW (ref 39–117)
BUN: 17 mg/dL (ref 6–23)
CO2: 29 mEq/L (ref 19–32)
Calcium: 9.4 mg/dL (ref 8.4–10.5)
Chloride: 99 mEq/L (ref 96–112)
Creatinine, Ser: 1.21 mg/dL (ref 0.40–1.50)
GFR: 61.39 mL/min (ref >60.00)
Glucose, Bld: 132 mg/dL — ABNORMAL HIGH (ref 70–99)
Potassium: 4 mEq/L (ref 3.5–5.1)
Sodium: 137 mEq/L (ref 135–145)
Total Bilirubin: 0.7 mg/dL (ref 0.2–1.2)
Total Protein: 6.8 g/dL (ref 6.0–8.3)

## 2020-12-13 ENCOUNTER — Encounter: Payer: Self-pay | Admitting: Endocrinology

## 2020-12-13 ENCOUNTER — Other Ambulatory Visit: Payer: Self-pay

## 2020-12-13 ENCOUNTER — Ambulatory Visit: Payer: 59 | Admitting: Endocrinology

## 2020-12-13 VITALS — BP 124/80 | HR 63 | Ht 65.75 in | Wt 155.6 lb

## 2020-12-13 DIAGNOSIS — I10 Essential (primary) hypertension: Secondary | ICD-10-CM

## 2020-12-13 DIAGNOSIS — E063 Autoimmune thyroiditis: Secondary | ICD-10-CM

## 2020-12-13 DIAGNOSIS — E782 Mixed hyperlipidemia: Secondary | ICD-10-CM

## 2020-12-13 DIAGNOSIS — E1165 Type 2 diabetes mellitus with hyperglycemia: Secondary | ICD-10-CM | POA: Diagnosis not present

## 2020-12-13 MED ORDER — TRULICITY 4.5 MG/0.5ML ~~LOC~~ SOAJ: 4.5000 mg | SUBCUTANEOUS | 3 refills | Status: DC

## 2020-12-13 NOTE — Progress Notes (Signed)
Patient ID: Ricardo [name], male   DOB: Apr 15, 1950, 70 y.o.   MRN: [id number]           Reason for Appointment: Follow-up    Referring physician:  [name]   History of Present Illness:          Diagnosis: Type 2 diabetes mellitus, date of diagnosis: 2009       Past history: At diagnosis he was having symptoms of fatigue.  He was started on glipizide initially and subsequently metformin was added.  Further details are not available However he has been somewhat irregular with his follow-up and did not have an A1c with his PCP between 8/13 and 11/15 He was referred here because of rising A1c of 7.5 He had been started on Trulicity in 6/07 because of gradually increasing A1c He stopped taking Trulicity in 3710 because of out-of-pocket expense  Recent history:     Oral hypoglycemic drugs : Trulicity 3 mg weekly, glipizide ER 2.5 mg, metformin 1 g twice a day, Actos 15 mg daily     His A1c is 7.6 compared to 7.4   Previously has been as low as 6.8 and highest 9.1  Current blood sugar patterns, problems identified and management: He was last seen about 6 months ago  He still has difficulty watching his diet and getting too many sweets He thinks this is since his his wife is buying a lot of sweets regularly and he cannot resist eating them  This is despite increasing his TRULICITY up to 3 mg on the last visit  Although he has not done consistent exercise he is trying to walk up to 1 mile on most days  Overall caloric intake may be less since he has lost weight  He has only a few blood sugars in the last few days since he says his meter is not working well  He has variable readings both before and after meals and does not check after dinner  Lab fasting glucose 132 However he thinks some of his high readings in the morning may be when eating late at night Has taken all his medications regularly       No edema with Actos and no symptoms of low sugars  Side effects from  medications have been: None   Glucose monitoring:  done usually one time a day         Glucometer:  [employer]  Blood Glucose readings reviewed from monitor meter fax information   PRE-MEAL Fasting Lunch Dinner Bedtime Overall  Glucose range: 116-159 95-148     Mean/median:     152 average for 90 days   POST-MEAL PC Breakfast PC Lunch PC Dinner  Glucose range:  149-179   Mean/median:      Previously  PRE-MEAL Fasting Lunch Dinner Bedtime Overall  Glucose range:  122-134  124, 142  96-210  94-221  Mean/median:     145       Self-care:  Meals: 3 meals per day. Breakfast is oatmeal/cereal/eggs and toast.  Has half sandwich or soup at lunch, avoiding rice and carbs usually        Dietician visit, most recent: at diagnosis               Weight history:   Wt Readings from Last 3 Encounters:  12/13/20 155 lb 9.6 oz (70.6 kg)  06/15/20 165 lb (74.8 kg)  02/16/20 161 lb 6.4 oz (73.2 kg)    Glycemic control:   Lab Results  Component Value Date   HGBA1C 7.6 (H) 12/09/2020   HGBA1C 7.4 (H) 06/09/2020   HGBA1C 7.4 (H) 02/11/2020   Lab Results  Component Value Date   MICROALBUR <0.7 02/11/2020   LDLCALC 96 02/11/2020   CREATININE 1.21 12/09/2020   Lab on 12/09/2020  Component Date Value Ref Range Status   Sodium 12/09/2020 137  135 - 145 mEq/L Final   Potassium 12/09/2020 4.0  3.5 - 5.1 mEq/L Final   Chloride 12/09/2020 99  96 - 112 mEq/L Final   CO2 12/09/2020 29  19 - 32 mEq/L Final   Glucose, Bld 12/09/2020 132 (A) 70 - 99 mg/dL Final   BUN 12/09/2020 17  6 - 23 mg/dL Final   Creatinine, Ser 12/09/2020 1.21  0.40 - 1.50 mg/dL Final   Total Bilirubin 12/09/2020 0.7  0.2 - 1.2 mg/dL Final   Alkaline Phosphatase 12/09/2020 35 (A) 39 - 117 U/L Final   AST 12/09/2020 14  0 - 37 U/L Final   ALT 12/09/2020 11  0 - 53 U/L Final   Total Protein 12/09/2020 6.8  6.0 - 8.3 g/dL Final   Albumin 12/09/2020 4.2  3.5 - 5.2 g/dL Final   GFR 12/09/2020 61.39  >60.00 mL/min Final    Calculated using the CKD-EPI Creatinine Equation (2021)   Calcium 12/09/2020 9.4  8.4 - 10.5 mg/dL Final   Hgb A1c MFr Bld 12/09/2020 7.6 (A) 4.6 - 6.5 % Final   Glycemic Control Guidelines for People with Diabetes:Non Diabetic:  <6%Goal of Therapy: <7%Additional Action Suggested:  >8%      Allergies as of 12/13/2020   No Known Allergies      Medication List        Accurate as of December 13, 2020 10:47 AM. If you have any questions, ask your nurse or doctor.          alfuzosin 10 MG 24 hr tablet Commonly known as: UROXATRAL Take 10 mg by mouth daily with breakfast.   aspirin 81 MG tablet Take 81 mg by mouth daily.   atorvastatin 20 MG tablet Commonly known as: LIPITOR Take 1 tablet (20 mg total) by mouth daily. Keep scheduled appt for future refills   dutasteride 0.5 MG capsule Commonly known as: AVODART Take 0.5 mg by mouth daily.   glipiZIDE 2.5 MG 24 hr tablet Commonly known as: GLUCOTROL XL TAKE 1 TABLET BY MOUTH EVERY DAY WITH BREAKFAST   glucose blood test strip Use as instructed. .Check sugar 3-4 times daily. E11.65   levothyroxine 100 MCG tablet Commonly known as: SYNTHROID TAKE 1 TABLET BY MOUTH EVERY DAY   losartan 100 MG tablet Commonly known as: COZAAR TAKE 1 TABLET BY MOUTH ONCE DAILY   metFORMIN 1000 MG tablet Commonly known as: GLUCOPHAGE TAKE 1 TABLET BY MOUTH TWICE A DAY   onetouch ultrasoft lancets Use as instructed.Check sugar 3-4 times daily. E11.65   OneTouch Verio w/Device Kit Check sugar 3-4 times daily. E11.65   pioglitazone 15 MG tablet Commonly known as: ACTOS TAKE 1 TABLET BY MOUTH EVERY DAY   Trulicity 3 IO/9.6EX Sopn Generic drug: Dulaglutide Inject 3 mg into the skin once a week.   valACYclovir 500 MG tablet Commonly known as: VALTREX Take 1 tablet (500 mg total) by mouth daily. Overdue for Annual appt must see provider for future refills        Allergies: No Known Allergies  Past Medical History:   Diagnosis Date   ARTHRITIS, HIP 08/10/2008   BURSITIS, RIGHT SHOULDER  05/20/2008   Degeneration of lumbar or lumbosacral intervertebral disc 11/27/2006   DEGENERATION, CERVICAL DISC 11/27/2006   Degenerative disc disease, lumbar    Depression    no per pt 03-30-19   DEPRESSION 11/25/2006   Diabetes mellitus    DIABETES MELLITUS, TYPE II 04/10/2007   Epicondylitis    bilateral   Fatigue    HERPES GENITALIS 09/17/2007   Hyperlipemia    HYPERLIPIDEMIA 03/31/2007   Hypertension    HYPERTENSION 11/25/2006   HYPOTHYROIDISM 10/28/2007   Impaired fasting glucose 12/26/2006   Lateral epicondylitis  of elbow 05/05/2007   Sciatica 10/28/2007   SHOULDER PAIN, RIGHT 08/18/2008    Past Surgical History:  Procedure Laterality Date   ACHILLES TENDON REPAIR     left   COLONOSCOPY     ELBOW SURGERY  2005   left   herniated lumbar disc  1997, 2011   KNEE ARTHROSCOPY  2007   left   [place]   right    Family History  Problem Relation Age of Onset   Diabetes Mother    Heart disease Mother    Heart disease Father    Diabetes Sister    Colon cancer Neg Hx    Esophageal cancer Neg Hx    Rectal cancer Neg Hx    Stomach cancer Neg Hx     Social History:  reports that he has never smoked. He has never used smokeless tobacco. He reports current alcohol use of about 3.0 standard drinks per week. He reports that he does not use drugs.    Review of Systems         Lipids: He has been treated with Lipitor 20 mg  With taking this regularly LDL is controlled       Lab Results  Component Value Date   CHOL 162 02/11/2020   HDL 51.10 02/11/2020   LDLCALC 96 02/11/2020   LDLDIRECT 79.0 07/23/2018   TRIG 73.0 02/11/2020   CHOLHDL 3 02/11/2020                   Thyroid: He has been on Synthroid for several years years, initially had symptoms of fatigue at diagnosis He is on 100 mcg  TSH consistently normal  Lab Results  Component Value Date   TSH 2.56 06/09/2020   TSH 3.00  11/10/2019   TSH 3.21 03/12/2019     Followed by pulmonologist for pulmonary fibrosis   HYPERTENSION: Blood pressure is ok on Losartan  BP Readings from Last 3 Encounters:  12/13/20 124/80  06/15/20 (!) 150/100  02/16/20 (!) 164/92       LABS:  Lab on 12/09/2020  Component Date Value Ref Range Status   Sodium 12/09/2020 137  135 - 145 mEq/L Final   Potassium 12/09/2020 4.0  3.5 - 5.1 mEq/L Final   Chloride 12/09/2020 99  96 - 112 mEq/L Final   CO2 12/09/2020 29  19 - 32 mEq/L Final   Glucose, Bld 12/09/2020 132 (A) 70 - 99 mg/dL Final   BUN 12/09/2020 17  6 - 23 mg/dL Final   Creatinine, Ser 12/09/2020 1.21  0.40 - 1.50 mg/dL Final   Total Bilirubin 12/09/2020 0.7  0.2 - 1.2 mg/dL Final   Alkaline Phosphatase 12/09/2020 35 (A) 39 - 117 U/L Final   AST 12/09/2020 14  0 - 37 U/L Final   ALT 12/09/2020 11  0 - 53 U/L Final   Total Protein 12/09/2020 6.8  6.0 - 8.3  g/dL Final   Albumin 12/09/2020 4.2  3.5 - 5.2 g/dL Final   GFR 12/09/2020 61.39  >60.00 mL/min Final   Calculated using the CKD-EPI Creatinine Equation (2021)   Calcium 12/09/2020 9.4  8.4 - 10.5 mg/dL Final   Hgb A1c MFr Bld 12/09/2020 7.6 (A) 4.6 - 6.5 % Final   Glycemic Control Guidelines for People with Diabetes:Non Diabetic:  <6%Goal of Therapy: <7%Additional Action Suggested:  >8%        Physical Examination:  BP 124/80   Pulse 63   Ht 5' 5.75" (1.67 m)   Wt 155 lb 9.6 oz (70.6 kg)   SpO2 99%   BMI 25.31 kg/m              ASSESSMENT:  Diabetes type 2, nonobese.   See history of present illness for detailed discussion of his current management, blood sugar patterns and problems identified  A1c has stays consistently over 7  He is not regular with his follow-up He thinks his main difficulty with diet is that he is eating sweets A1c is not improved despite his weight loss Currently he is monitoring his blood sugars mostly around lunchtime and not after dinner which is his main  meal  Hypertension: Appears better controlled, he thinks it is better at home and consistent  Renal function normal  Hypothyroidism: Adequately replaced previously and will need follow-up    PLAN:  Trulicity 4.5 mg weekly Also may consider Mounjaro if blood sugars are not well controlled Increase Actos to 30 mg He will try to cut back on snacks especially carbohydrates and sweets. He was recommended consultation with the dietitian but he thinks he can do it himself  He will try to take his losartan at dinnertime instead of later in the evening Advised him to call his PCP to review his blood pressure control He will start checking his blood pressure more regularly with the instrument that works   To call if blood sugars are running consistently higher  Follow-up in 4 months   There are no Patient Instructions on file for this visit.  Elayne Snare 12/13/2020, 10:47 AM   Note: This office note was prepared with Dragon voice recognition system technology. Any transcriptional errors that result from this process are unintentional.

## 2020-12-13 NOTE — Patient Instructions (Addendum)
Check blood sugars on waking up 3-4  days a week  Also check blood sugars about 2 hours after meals and do this after different meals by rotation  Recommended blood sugar levels on waking up are 90-130 and about 2 hours after meal is 130-160  Please bring your blood sugar monitor to each visit, thank you  Take 2 Actos 15mg  daily

## 2021-02-04 IMAGING — DX DG KNEE 1-2V*R*
2 series · 2 of 2 positions shown · non-contrast
Comparison: None.

CLINICAL DATA: Progressive pain

EXAM:
RIGHT KNEE - 1-2 VIEW

[knee ap]
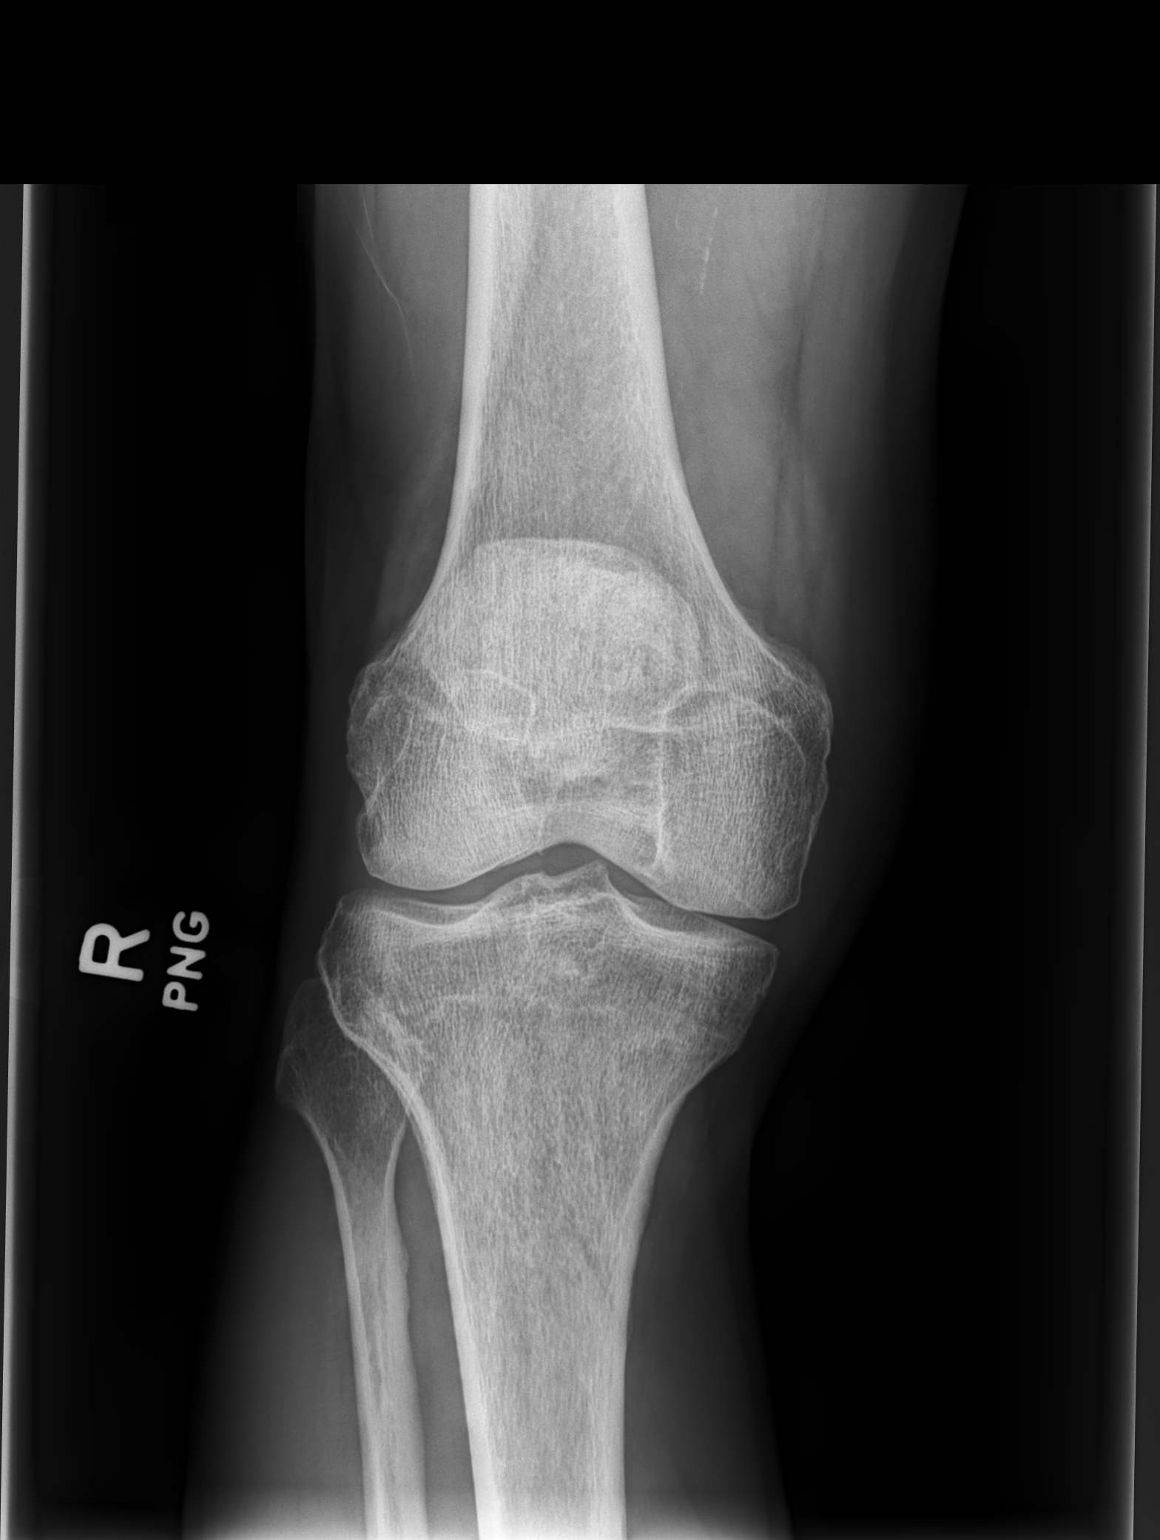

[knee lat]
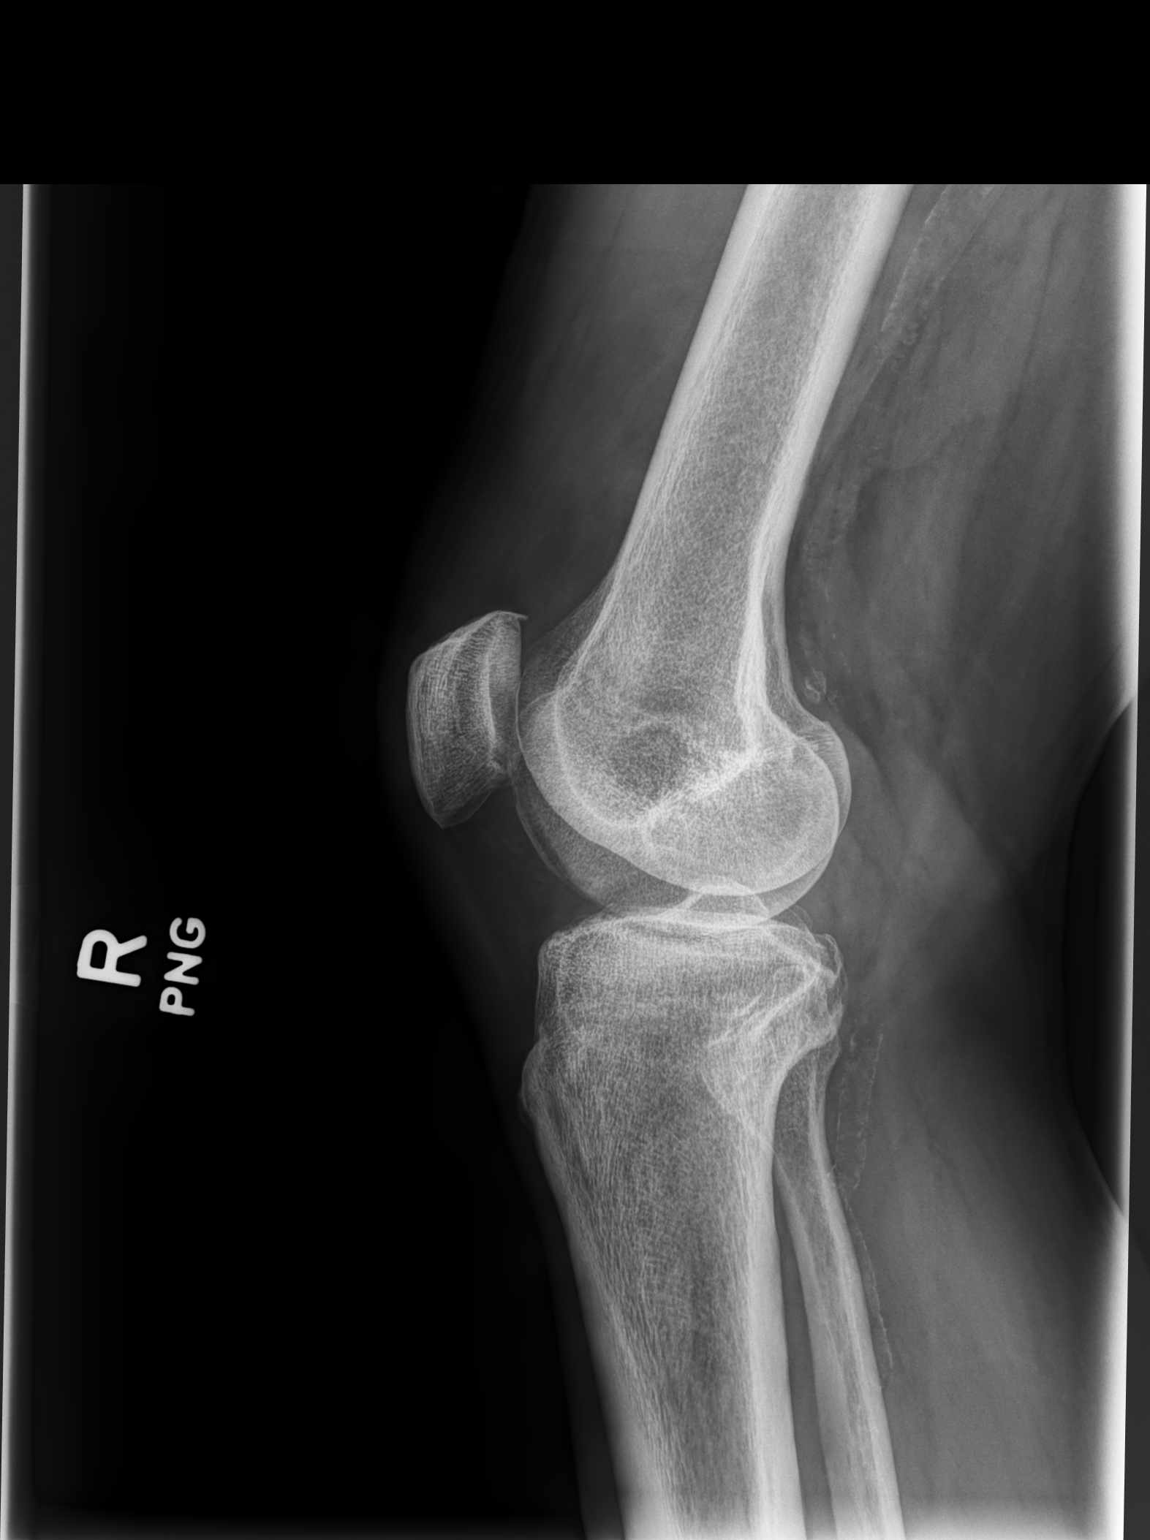

[2 of 2 positions shown; findings below may reference images not displayed]

FINDINGS: Frontal and lateral views obtained. No fracture, dislocation, or
effusion. There is mild narrowing of the patellofemoral joint. Other
joint spaces appear normal. There is mild spurring along the
posterior patella. No erosive change. There are multiple foci of
arterial vascular calcification.
IMPRESSION: Mild narrowing patellofemoral joint. Other joint spaces appear
unremarkable. No fracture, dislocation, or effusion. Multiple foci
of arterial vascular atherosclerotic calcification noted.

## 2021-03-17 ENCOUNTER — Ambulatory Visit: Payer: 59 | Admitting: Endocrinology

## 2021-03-21 ENCOUNTER — Other Ambulatory Visit (INDEPENDENT_AMBULATORY_CARE_PROVIDER_SITE_OTHER): Payer: 59

## 2021-03-21 ENCOUNTER — Other Ambulatory Visit: Payer: Self-pay

## 2021-03-21 DIAGNOSIS — E1165 Type 2 diabetes mellitus with hyperglycemia: Secondary | ICD-10-CM | POA: Diagnosis not present

## 2021-03-21 DIAGNOSIS — E782 Mixed hyperlipidemia: Secondary | ICD-10-CM

## 2021-03-21 LAB — COMPREHENSIVE METABOLIC PANEL
ALT: 11 U/L (ref 0–53)
AST: 11 U/L (ref 0–37)
Albumin: 4.6 g/dL (ref 3.5–5.2)
Alkaline Phosphatase: 33 U/L — ABNORMAL LOW (ref 39–117)
BUN: 17 mg/dL (ref 6–23)
CO2: 31 mEq/L (ref 19–32)
Calcium: 9.8 mg/dL (ref 8.4–10.5)
Chloride: 101 mEq/L (ref 96–112)
Creatinine, Ser: 1.15 mg/dL (ref 0.40–1.50)
GFR: 65.12 mL/min (ref >60.00)
Glucose, Bld: 121 mg/dL — ABNORMAL HIGH (ref 70–99)
Potassium: 5 mEq/L (ref 3.5–5.1)
Sodium: 138 mEq/L (ref 135–145)
Total Bilirubin: 0.7 mg/dL (ref 0.2–1.2)
Total Protein: 6.9 g/dL (ref 6.0–8.3)

## 2021-03-21 LAB — LIPID PANEL
Cholesterol: 184 mg/dL (ref 0–200)
HDL: 52.6 mg/dL (ref >39.00)
LDL Cholesterol: 115 mg/dL — ABNORMAL HIGH (ref 0–99)
NonHDL: 131.15
Total CHOL/HDL Ratio: 3
Triglycerides: 79 mg/dL (ref 0.0–149.0)
VLDL: 15.8 mg/dL (ref 0.0–40.0)

## 2021-03-21 LAB — TSH: TSH: 4.68 u[IU]/mL (ref 0.35–5.50)

## 2021-03-21 LAB — HEMOGLOBIN A1C: Hgb A1c MFr Bld: 7.6 % — ABNORMAL HIGH (ref 4.6–6.5)

## 2021-03-21 LAB — T4, FREE: Free T4: 0.85 ng/dL (ref 0.60–1.60)

## 2021-03-23 ENCOUNTER — Ambulatory Visit: Payer: 59 | Admitting: Endocrinology

## 2021-03-23 ENCOUNTER — Encounter: Payer: Self-pay | Admitting: Endocrinology

## 2021-03-23 ENCOUNTER — Other Ambulatory Visit: Payer: Self-pay

## 2021-03-23 VITALS — BP 140/80 | HR 68 | Ht 66.0 in | Wt 154.8 lb

## 2021-03-23 DIAGNOSIS — E063 Autoimmune thyroiditis: Secondary | ICD-10-CM | POA: Diagnosis not present

## 2021-03-23 DIAGNOSIS — E1165 Type 2 diabetes mellitus with hyperglycemia: Secondary | ICD-10-CM | POA: Diagnosis not present

## 2021-03-23 DIAGNOSIS — E782 Mixed hyperlipidemia: Secondary | ICD-10-CM | POA: Diagnosis not present

## 2021-03-23 DIAGNOSIS — I10 Essential (primary) hypertension: Secondary | ICD-10-CM | POA: Diagnosis not present

## 2021-03-23 MED ORDER — METFORMIN HCL ER 500 MG PO TB24
2000.0000 mg | ORAL_TABLET | Freq: Every day | ORAL | 3 refills | Status: DC
Start: 2021-03-23 — End: 2021-10-24

## 2021-03-23 MED ORDER — PIOGLITAZONE HCL 30 MG PO TABS: 30.0000 mg | ORAL_TABLET | Freq: Every day | ORAL | 1 refills | Status: DC

## 2021-03-23 NOTE — Patient Instructions (Addendum)
Check blood sugars on waking up 3-4 days a week  Also check blood sugars about 2 hours after meals and do this after different meals by rotation  Recommended blood sugar levels on waking up are 90-130 and about 2 hours after meal is 130-160  Please bring your blood sugar monitor to each visit, thank you  Exercise daily  Take Atorvastatin in am  Take thyroid 15 min before Bfst  Metformin with food

## 2021-03-23 NOTE — Progress Notes (Signed)
Patient ID: Ricardo [name], male   DOB: 1950/07/04, 71 y.o.   MRN: [id number]           Reason for Appointment: Follow-up    Referring physician:  [name]   History of Present Illness:          Diagnosis: Type 2 diabetes mellitus, date of diagnosis: 2009       Past history: At diagnosis he was having symptoms of fatigue.  He was started on glipizide initially and subsequently metformin was added.  Further details are not available However he has been somewhat irregular with his follow-up and did not have an A1c with his PCP between 8/13 and 11/15 He was referred here because of rising A1c of 7.5 He had been started on Trulicity in 7/61 because of gradually increasing A1c He stopped taking Trulicity in 6073 because of out-of-pocket expense  Recent history:     Oral hypoglycemic drugs : Trulicity 4.5 mg weekly, glipizide ER 2.5 mg, metformin 1 g twice a day, Actos 15 mg daily     His A1c is 7.6 compared to 7.6   Previously A1c has been as low as 6.8 and highest 9.1  Current blood sugar patterns, problems identified and management: Meter started using the One Touch meter about a week or so because of previous meter being nonfunctioning He likely does not check his blood sugars after dinner and mostly in the mornings are after breakfast  He has not started back on any exercise  No again he does not think he is able to follow his diet if there is any sweets or junk food available at home, his wife has been usually doing the shopping This is despite increasing his TRULICITY up to 4.5 mg on the last visit  Although he says he is taking all his medications regularly the refill history available in epic does not indicate he has had regular refills for some of his medications including metformin and glipizide Also did not ask for a new prescription for 30 mg Actos after his last visit when he was using up his 15 mg tablets He says that sometimes the metformin will make him nauseated  but he is taking it before starting to eat  He has been able to take his Trulicity weekly without any difficulty getting the refills and has no nausea with this        Side effects from medications have been: None   Glucose monitoring:  done usually one-2 time a day         Glucometer: One Touch Verio  Blood Glucose readings reviewed    PRE-MEAL Fasting Lunch Dinner Bedtime Overall  Glucose range:       Mean/median:     145   POST-MEAL PC Breakfast PC Lunch PC Dinner  Glucose range:     Mean/median:      Prior   PRE-MEAL Fasting Lunch Dinner Bedtime Overall  Glucose range: 116-159 95-148     Mean/median:     152 average for 90 days   POST-MEAL PC Breakfast PC Lunch PC Dinner  Glucose range:  149-179   Mean/median:        Self-care:  Meals: 3 meals per day. Breakfast is oatmeal/cereal/eggs and toast.  Has half sandwich or soup at lunch, avoiding rice and carbs usually        Dietician visit, most recent: at diagnosis               Weight history:  Wt Readings from Last 3 Encounters:  03/23/21 154 lb 12.8 oz (70.2 kg)  12/13/20 155 lb 9.6 oz (70.6 kg)  06/15/20 165 lb (74.8 kg)    Glycemic control:   Lab Results  Component Value Date   HGBA1C 7.6 (H) 03/21/2021   HGBA1C 7.6 (H) 12/09/2020   HGBA1C 7.4 (H) 06/09/2020   Lab Results  Component Value Date   MICROALBUR <0.7 02/11/2020   [place] 115 (H) 03/21/2021   CREATININE 1.15 03/21/2021   Lab on 03/21/2021  Component Date Value Ref Range Status   TSH 03/21/2021 4.68  0.35 - 5.50 uIU/mL Final   Free T4 03/21/2021 0.85  0.60 - 1.60 ng/dL Final   Comment: Specimens from patients who are undergoing biotin therapy and /or ingesting biotin supplements may contain high levels of biotin.  The higher biotin concentration in these specimens interferes with this Free T4 assay.  Specimens that contain high levels  of biotin may cause false high results for this Free T4 assay.  Please interpret results in light of  the total clinical presentation of the patient.     Cholesterol 03/21/2021 184  0 - 200 mg/dL Final   ATP III Classification       Desirable:  < 200 mg/dL               Borderline High:  200 - 239 mg/dL          High:  > = 240 mg/dL   Triglycerides 03/21/2021 79.0  0.0 - 149.0 mg/dL Final   Normal:  <150 mg/dLBorderline High:  150 - 199 mg/dL   HDL 03/21/2021 52.60  >39.00 mg/dL Final   VLDL 03/21/2021 15.8  0.0 - 40.0 mg/dL Final   LDL Cholesterol 03/21/2021 115 (H)  0 - 99 mg/dL Final   Total CHOL/HDL Ratio 03/21/2021 3   Final                  Men          Women1/2 Average Risk     3.4          3.3Average Risk          5.0          4.42X Average Risk          9.6          7.13X Average Risk          15.0          11.0                       NonHDL 03/21/2021 131.15   Final   NOTE:  Non-HDL goal should be 30 mg/dL higher than patient's LDL goal (i.e. LDL goal of < 70 mg/dL, would have non-HDL goal of < 100 mg/dL)   Sodium 03/21/2021 138  135 - 145 mEq/L Final   Potassium 03/21/2021 5.0  3.5 - 5.1 mEq/L Final   Chloride 03/21/2021 101  96 - 112 mEq/L Final   CO2 03/21/2021 31  19 - 32 mEq/L Final   Glucose, Bld 03/21/2021 121 (H)  70 - 99 mg/dL Final   BUN 03/21/2021 17  6 - 23 mg/dL Final   Creatinine, Ser 03/21/2021 1.15  0.40 - 1.50 mg/dL Final   Total Bilirubin 03/21/2021 0.7  0.2 - 1.2 mg/dL Final   Alkaline Phosphatase 03/21/2021 33 (L)  39 - 117 U/L Final   AST 03/21/2021 11  0 -  37 U/L Final   ALT 03/21/2021 11  0 - 53 U/L Final   Total Protein 03/21/2021 6.9  6.0 - 8.3 g/dL Final   Albumin 03/21/2021 4.6  3.5 - 5.2 g/dL Final   GFR 03/21/2021 65.12  >60.00 mL/min Final   Calculated using the CKD-EPI Creatinine Equation (2021)   Calcium 03/21/2021 9.8  8.4 - 10.5 mg/dL Final   Hgb A1c MFr Bld 03/21/2021 7.6 (H)  4.6 - 6.5 % Final   Glycemic Control Guidelines for People with Diabetes:Non Diabetic:  <6%Goal of Therapy: <7%Additional Action Suggested:  >8%      Allergies as of  03/23/2021   No Known Allergies      Medication List        Accurate as of March 23, 2021  8:58 AM. If you have any questions, ask your nurse or doctor.          alfuzosin 10 MG 24 hr tablet Commonly known as: UROXATRAL Take 10 mg by mouth daily with breakfast.   aspirin 81 MG tablet Take 81 mg by mouth daily.   atorvastatin 20 MG tablet Commonly known as: LIPITOR Take 1 tablet (20 mg total) by mouth daily. Keep scheduled appt for future refills   dutasteride 0.5 MG capsule Commonly known as: AVODART Take 0.5 mg by mouth daily.   glipiZIDE 2.5 MG 24 hr tablet Commonly known as: GLUCOTROL XL TAKE 1 TABLET BY MOUTH EVERY DAY WITH BREAKFAST   glucose blood test strip Use as instructed. .Check sugar 3-4 times daily. E11.65   levothyroxine 100 MCG tablet Commonly known as: SYNTHROID TAKE 1 TABLET BY MOUTH EVERY DAY   losartan 100 MG tablet Commonly known as: COZAAR TAKE 1 TABLET BY MOUTH ONCE DAILY   metFORMIN 1000 MG tablet Commonly known as: GLUCOPHAGE TAKE 1 TABLET BY MOUTH TWICE A DAY   onetouch ultrasoft lancets Use as instructed.Check sugar 3-4 times daily. E11.65   OneTouch Verio w/Device Kit Check sugar 3-4 times daily. E11.65   pioglitazone 15 MG tablet Commonly known as: ACTOS TAKE 1 TABLET BY MOUTH EVERY DAY   Trulicity 4.5 ZO/1.0RU Sopn Generic drug: Dulaglutide Inject 4.5 mg as directed once a week.   valACYclovir 500 MG tablet Commonly known as: VALTREX Take 1 tablet (500 mg total) by mouth daily. Overdue for Annual appt must see provider for future refills        Allergies: No Known Allergies  Past Medical History:  Diagnosis Date   ARTHRITIS, HIP 08/10/2008   BURSITIS, RIGHT SHOULDER 05/20/2008   Degeneration of lumbar or lumbosacral intervertebral disc 11/27/2006   DEGENERATION, CERVICAL DISC 11/27/2006   Degenerative disc disease, lumbar    Depression    no per pt 03-30-19   DEPRESSION 11/25/2006   Diabetes mellitus     DIABETES MELLITUS, TYPE II 04/10/2007   Epicondylitis    bilateral   Fatigue    HERPES GENITALIS 09/17/2007   Hyperlipemia    HYPERLIPIDEMIA 03/31/2007   Hypertension    HYPERTENSION 11/25/2006   HYPOTHYROIDISM 10/28/2007   Impaired fasting glucose 12/26/2006   Lateral epicondylitis  of elbow 05/05/2007   Sciatica 10/28/2007   SHOULDER PAIN, RIGHT 08/18/2008    Past Surgical History:  Procedure Laterality Date   ACHILLES TENDON REPAIR     left   COLONOSCOPY     ELBOW SURGERY  2005   left   herniated lumbar disc  1997, 2011   KNEE ARTHROSCOPY  2007   left   ROTATOR CUFF REPAIR  1997   right    Family History  Problem Relation Age of Onset   Diabetes Mother    Heart disease Mother    Heart disease Father    Diabetes Sister    Colon cancer Neg Hx    Esophageal cancer Neg Hx    Rectal cancer Neg Hx    Stomach cancer Neg Hx     Social History:  reports that he has never smoked. He has never used smokeless tobacco. He reports current alcohol use of about 3.0 standard drinks per week. He reports that he does not use drugs.    Review of Systems         Lipids: He has been treated with Lipitor 20 mg  With taking this regularly LDL is controlled but he tends to forget to take it, he is trying to take it after dinner       Lab Results  Component Value Date   CHOL 184 03/21/2021   CHOL 162 02/11/2020   CHOL 167 06/15/2019   Lab Results  Component Value Date   HDL 52.60 03/21/2021   HDL 51.10 02/11/2020   HDL 60.50 06/15/2019   Lab Results  Component Value Date   LDLCALC 115 (H) 03/21/2021   LDLCALC 96 02/11/2020   LDLCALC 96 06/15/2019   Lab Results  Component Value Date   TRIG 79.0 03/21/2021   TRIG 73.0 02/11/2020   TRIG 52.0 06/15/2019   Lab Results  Component Value Date   CHOLHDL 3 03/21/2021   CHOLHDL 3 02/11/2020   CHOLHDL 3 06/15/2019   Lab Results  Component Value Date   LDLDIRECT 79.0 07/23/2018   LDLDIRECT 126.0 05/12/2014   LDLDIRECT 138.1  12/23/2012                    Thyroid: He has been on Synthroid for several years years, initially had symptoms of fatigue at diagnosis He is on 100 mcg levothyroxine now However he says he takes his food and not before eating  TSH consistently normal  Lab Results  Component Value Date   FREET4 0.85 03/21/2021   TSH 4.68 03/21/2021   TSH 2.56 06/09/2020   TSH 3.00 11/10/2019     Followed by pulmonologist for pulmonary fibrosis  HYPERTENSION: Blood pressure is ok on Losartan  BP Readings from Last 3 Encounters:  03/23/21 140/80  12/13/20 124/80  06/15/20 (!) 150/100   Complaining of somnolence in the evenings no fatigue during the daytime   LABS:  Lab on 03/21/2021  Component Date Value Ref Range Status   TSH 03/21/2021 4.68  0.35 - 5.50 uIU/mL Final   Free T4 03/21/2021 0.85  0.60 - 1.60 ng/dL Final   Comment: Specimens from patients who are undergoing biotin therapy and /or ingesting biotin supplements may contain high levels of biotin.  The higher biotin concentration in these specimens interferes with this Free T4 assay.  Specimens that contain high levels  of biotin may cause false high results for this Free T4 assay.  Please interpret results in light of the total clinical presentation of the patient.     Cholesterol 03/21/2021 184  0 - 200 mg/dL Final   ATP III Classification       Desirable:  < 200 mg/dL               Borderline High:  200 - 239 mg/dL          High:  > = 240 mg/dL  Triglycerides 03/21/2021 79.0  0.0 - 149.0 mg/dL Final   Normal:  <150 mg/dLBorderline High:  150 - 199 mg/dL   HDL 03/21/2021 52.60  >39.00 mg/dL Final   VLDL 03/21/2021 15.8  0.0 - 40.0 mg/dL Final   LDL Cholesterol 03/21/2021 115 (H)  0 - 99 mg/dL Final   Total CHOL/HDL Ratio 03/21/2021 3   Final                  Men          Women1/2 Average Risk     3.4          3.3Average Risk          5.0          4.42X Average Risk          9.6          7.13X Average Risk          15.0           11.0                       NonHDL 03/21/2021 131.15   Final   NOTE:  Non-HDL goal should be 30 mg/dL higher than patient's LDL goal (i.e. LDL goal of < 70 mg/dL, would have non-HDL goal of < 100 mg/dL)   Sodium 03/21/2021 138  135 - 145 mEq/L Final   Potassium 03/21/2021 5.0  3.5 - 5.1 mEq/L Final   Chloride 03/21/2021 101  96 - 112 mEq/L Final   CO2 03/21/2021 31  19 - 32 mEq/L Final   Glucose, Bld 03/21/2021 121 (H)  70 - 99 mg/dL Final   BUN 03/21/2021 17  6 - 23 mg/dL Final   Creatinine, Ser 03/21/2021 1.15  0.40 - 1.50 mg/dL Final   Total Bilirubin 03/21/2021 0.7  0.2 - 1.2 mg/dL Final   Alkaline Phosphatase 03/21/2021 33 (L)  39 - 117 U/L Final   AST 03/21/2021 11  0 - 37 U/L Final   ALT 03/21/2021 11  0 - 53 U/L Final   Total Protein 03/21/2021 6.9  6.0 - 8.3 g/dL Final   Albumin 03/21/2021 4.6  3.5 - 5.2 g/dL Final   GFR 03/21/2021 65.12  >60.00 mL/min Final   Calculated using the CKD-EPI Creatinine Equation (2021)   Calcium 03/21/2021 9.8  8.4 - 10.5 mg/dL Final   Hgb A1c MFr Bld 03/21/2021 7.6 (H)  4.6 - 6.5 % Final   Glycemic Control Guidelines for People with Diabetes:Non Diabetic:  <6%Goal of Therapy: <7%Additional Action Suggested:  >8%        Physical Examination:  BP 140/80    Pulse 68    Ht _0  (1.676 m)    Wt 154 lb 12.8 oz (70.2 kg)    SpO2 95%    BMI 24.99 kg/m              ASSESSMENT:  Diabetes type 2, nonobese.   See history of present illness for detailed discussion of his current management, blood sugar patterns and problems identified  A1c has not improved, again 7.6  Blood sugars are not controlled because of possibly being irregular with his medications, poor diet, lack of exercise and likely some insulin deficiency Currently he is monitoring his blood sugars mostly in the morning or midday and may have higher readings after dinner which he is not aware of Recently blood sugars averaging 145 which is lower than expected from A1c  Hypertension: Fairly well controlled, he also monitors at home  LDL is high from being irregular with his Lipitor  Renal function normal  Hypothyroidism: TSH is high normal but his evening somnolence is not likely related to hypothyroidism Currently not taking his levothyroxine on empty stomach    PLAN:  Continue Trulicity 4.5 mg weekly Start regular exercise Check blood sugars after dinner more consistently Increase Actos to 30 mg Change metformin to extended release and take it with food and this will reduce his nausea Needs to cut back on high carbohydrate and high fat foods including cheeses  Take ATORVASTATIN in the morning or right at dinnertime for better compliance  To take levothyroxine 15 minutes before breakfast  He was recommended consultation with the dietitian and he needs to go with his wife to help with his meal planning  He will try to take his losartan at dinnertime instead of later in the evening Advised him to call his PCP to follow-up and also review his symptoms of evening somnolence, may have sleep apnea  Check blood pressure regularly   To call if blood sugars are running consistently higher  Follow-up in 3 months   There are no Patient Instructions on file for this visit.  Elayne Snare 03/23/2021, 8:58 AM   Note: This office note was prepared with Dragon voice recognition system technology. Any transcriptional errors that result from this process are unintentional.

## 2021-03-30 LAB — HM DIABETES EYE EXAM

## 2021-04-05 ENCOUNTER — Other Ambulatory Visit: Payer: Self-pay | Admitting: Internal Medicine

## 2021-04-06 ENCOUNTER — Encounter: Payer: Self-pay | Admitting: Endocrinology

## 2021-04-13 ENCOUNTER — Ambulatory Visit: Payer: 59 | Admitting: Internal Medicine

## 2021-04-13 ENCOUNTER — Other Ambulatory Visit: Payer: Self-pay

## 2021-04-13 ENCOUNTER — Encounter: Payer: Self-pay | Admitting: Internal Medicine

## 2021-04-13 VITALS — BP 136/84 | HR 78 | Temp 98.2°F | Ht 66.0 in | Wt 158.0 lb

## 2021-04-13 DIAGNOSIS — M79645 Pain in left finger(s): Secondary | ICD-10-CM

## 2021-04-13 DIAGNOSIS — Z860100 Personal history of colon polyps, unspecified: Secondary | ICD-10-CM

## 2021-04-13 DIAGNOSIS — N486 Induration penis plastica: Secondary | ICD-10-CM

## 2021-04-13 DIAGNOSIS — Z0000 Encounter for general adult medical examination without abnormal findings: Secondary | ICD-10-CM

## 2021-04-13 DIAGNOSIS — E119 Type 2 diabetes mellitus without complications: Secondary | ICD-10-CM

## 2021-04-13 DIAGNOSIS — E785 Hyperlipidemia, unspecified: Secondary | ICD-10-CM

## 2021-04-13 NOTE — Assessment & Plan Note (Signed)
On Lipitor, ASA 2019 Coronary calcium CT score of 17.3.

## 2021-04-13 NOTE — Assessment & Plan Note (Signed)
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

## 2021-04-13 NOTE — Assessment & Plan Note (Signed)
Cont on Janvia, Metformin, Glipizide, Losartan, Lipitor, ASA

## 2021-04-13 NOTE — Patient Instructions (Signed)
Take Vit E 400 iu

## 2021-04-13 NOTE — Assessment & Plan Note (Signed)
Take Vit E 400 iu Urol f/u - Dr Amalia Hailey

## 2021-04-13 NOTE — Progress Notes (Signed)
Subjective:  Patient ID: Ricardo [name], male    DOB: 07-30-1950  Age: 71 y.o. MRN: [id number]  CC: No chief complaint on file.   HPI Niki Cosman Gorum presents for DOE w/exercising C/o L thumb pain after helping to build the deck Well exam.  Follow-up on diabetes, colon polyps, dyslipidemia  Outpatient Medications Prior to Visit  Medication Sig Dispense Refill   alfuzosin (UROXATRAL) 10 MG 24 hr tablet Take 10 mg by mouth daily with breakfast.     aspirin 81 MG tablet Take 81 mg by mouth daily.     atorvastatin (LIPITOR) 20 MG tablet Take 1 tablet (20 mg total) by mouth daily. Keep scheduled appt for future refills 90 tablet 0   Blood Glucose Monitoring Suppl (ONETOUCH VERIO) w/Device KIT Check sugar 3-4 times daily. E11.65 1 kit 0   dutasteride (AVODART) 0.5 MG capsule Take 0.5 mg by mouth daily.     glipiZIDE (GLUCOTROL XL) 2.5 MG 24 hr tablet TAKE 1 TABLET BY MOUTH EVERY DAY WITH BREAKFAST 90 tablet 1   glucose blood test strip Use as instructed. .Check sugar 3-4 times daily. E11.65 100 each 12   Lancets (ONETOUCH ULTRASOFT) lancets Use as instructed.Check sugar 3-4 times daily. E11.65 100 each 12   levothyroxine (SYNTHROID) 100 MCG tablet TAKE 1 TABLET BY MOUTH EVERY DAY 90 tablet 1   losartan (COZAAR) 100 MG tablet TAKE 1 TABLET BY MOUTH ONCE DAILY 90 tablet 1   metFORMIN (GLUCOPHAGE-XR) 500 MG 24 hr tablet Take 4 tablets (2,000 mg total) by mouth daily with supper. 360 tablet 3   pioglitazone (ACTOS) 30 MG tablet Take 1 tablet (30 mg total) by mouth daily. 90 tablet 1   valACYclovir (VALTREX) 500 MG tablet TAKE 1 TABLET BY MOUTH EVERY DAY **OFFICE VISIT NEEDED FOR MORE REFILLS 30 tablet 2   Dulaglutide (TRULICITY) 4.5 ZY/6.0YT SOPN Inject 4.5 mg as directed once a week. 2 mL 3   No facility-administered medications prior to visit.    ROS: Review of Systems  Constitutional:  Negative for appetite change, fatigue and unexpected weight change.  HENT:  Negative for  congestion, nosebleeds, sneezing, sore throat and trouble swallowing.   Eyes:  Negative for itching and visual disturbance.  Respiratory:  Negative for cough.   Cardiovascular:  Negative for chest pain, palpitations and leg swelling.  Gastrointestinal:  Negative for abdominal distention, blood in stool, diarrhea and nausea.  Genitourinary:  Negative for frequency and hematuria.  Musculoskeletal:  Positive for arthralgias. Negative for back pain, gait problem, joint swelling and neck pain.  Skin:  Negative for rash.  Neurological:  Negative for dizziness, tremors, speech difficulty and weakness.  Psychiatric/Behavioral:  Negative for agitation, dysphoric mood and sleep disturbance. The patient is not nervous/anxious.    Objective:  BP 136/84 (BP Location: Left Arm, Patient Position: Sitting, Cuff Size: Large)    Pulse 78    Temp 98.2 F (36.8 C) (Oral)    Ht $R'5\' 6"'jN$  (1.676 m)    Wt 158 lb (71.7 kg)    SpO2 95%    BMI 25.50 kg/m   BP Readings from Last 3 Encounters:  04/13/21 136/84  03/23/21 140/80  12/13/20 124/80    Wt Readings from Last 3 Encounters:  04/13/21 158 lb (71.7 kg)  03/23/21 154 lb 12.8 oz (70.2 kg)  12/13/20 155 lb 9.6 oz (70.6 kg)    Physical Exam Constitutional:      General: He is not in acute distress.  Appearance: He is well-developed.     Comments: NAD  Eyes:     Conjunctiva/sclera: Conjunctivae normal.     Pupils: Pupils are equal, round, and reactive to light.  Neck:     Thyroid: No thyromegaly.     Vascular: No JVD.  Cardiovascular:     Rate and Rhythm: Normal rate and regular rhythm.     Heart sounds: Normal heart sounds. No murmur heard.   No friction rub. No gallop.  Pulmonary:     Effort: Pulmonary effort is normal. No respiratory distress.     Breath sounds: Normal breath sounds. No wheezing or rales.  Chest:     Chest wall: No tenderness.  Abdominal:     General: Bowel sounds are normal. There is no distension.     Palpations: Abdomen is  soft. There is no mass.     Tenderness: There is no abdominal tenderness. There is no guarding or rebound.  Musculoskeletal:        General: Tenderness present. Normal range of motion.     Cervical back: Normal range of motion.  Lymphadenopathy:     Cervical: No cervical adenopathy.  Skin:    General: Skin is warm and dry.     Findings: No rash.  Neurological:     Mental Status: He is alert and oriented to person, place, and time.     Cranial Nerves: No cranial nerve deficit.     Motor: No abnormal muscle tone.     Coordination: Coordination normal.     Gait: Gait normal.     Deep Tendon Reflexes: Reflexes are normal and symmetric.  Psychiatric:        Behavior: Behavior normal.        Thought Content: Thought content normal.        Judgment: Judgment normal.   Left thumb first MCP with pain on range of motion  I spent 22 minutes in addition to time for CPX wellness examination in preparing to see the patient by review of recent labs, imaging and procedures, obtaining and reviewing separately obtained history, communicating with the patient, ordering medications, tests or procedures, and documenting clinical information in the EHR including the differential diagnosis, treatment, and any further evaluation and other management of thumb arthritis, diabetes, dyslipidemia, colon polyps        Lab Results  Component Value Date   WBC 6.6 03/12/2019   HGB 14.2 03/12/2019   HCT 43.2 03/12/2019   PLT 220.0 03/12/2019   GLUCOSE 121 (H) 03/21/2021   CHOL 184 03/21/2021   TRIG 79.0 03/21/2021   HDL 52.60 03/21/2021   LDLDIRECT 79.0 07/23/2018   LDLCALC 115 (H) 03/21/2021   ALT 11 03/21/2021   AST 11 03/21/2021   NA 138 03/21/2021   K 5.0 03/21/2021   CL 101 03/21/2021   CREATININE 1.15 03/21/2021   BUN 17 03/21/2021   CO2 31 03/21/2021   TSH 4.68 03/21/2021   PSA 1.90 03/12/2019   INR 1.0 04/02/2007   HGBA1C 7.6 (H) 03/21/2021   MICROALBUR <0.7 02/11/2020    CT Chest High  Resolution  Result Date: 05/07/2018 CLINICAL DATA:  Shortness of breath on exertion. A bronchitis in January. Persistent cough. EXAM: CT CHEST WITHOUT CONTRAST TECHNIQUE: Multidetector CT imaging of the chest was performed following the standard protocol without intravenous contrast. High resolution imaging of the lungs, as well as inspiratory and expiratory imaging, was performed. COMPARISON:  02/18/2018, 04/01/2007 and 04/24/2006. FINDINGS: Cardiovascular: Atherosclerotic calcification of the aorta. Heart  is at the upper limits of normal in size. No pericardial effusion. Mediastinum/Nodes: No pathologically enlarged mediastinal or axillary lymph nodes. Hilar regions are difficult to evaluate without IV contrast. Esophagus is grossly unremarkable. Lungs/Pleura: Peripheral and basilar subpleural ground-glass and interstitial coarsening with probable mild traction bronchiolectasis. No honeycombing. Minimal subpleural reticular densities. Findings are stable to minimally progressive from 04/01/2007. Mild air trapping. No pleural fluid. Airway is unremarkable. Upper Abdomen: Visualized portions of the liver, adrenal glands and right kidney are unremarkable. 4.7 cm low-attenuation lesion in the left kidney is incompletely imaged but likely a cyst. Visualized portions of the spleen, pancreas, stomach and bowel are grossly unremarkable. No upper abdominal adenopathy. Musculoskeletal: Degenerative changes in the spine. No worrisome lytic or sclerotic lesions. IMPRESSION: 1. 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 Findings are indeterminate for UIP per consensus guidelines: Diagnosis of Idiopathic Pulmonary Fibrosis: An Official ATS/ERS/JRS/ALAT Clinical Practice Guideline. [place], Iss 5, [phone], Oct 27 2016. 2.  Aortic atherosclerosis  (ICD10-170.0). Electronically Signed   By: Lorin Picket M.D.   On: 05/07/2018 15:08    Assessment & Plan:   Problem List Items Addressed This Visit     Diabetes type 2, controlled ([place])    Cont on Janvia, Metformin, Glipizide, Losartan, Lipitor, ASA      Dyslipidemia    On Lipitor, ASA 2019 Coronary calcium CT score of 17.3.      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 We discussed a need for adhering to healthy diet and exercise. Labs/EKG were reviewed/ordered. All questions were answered. CT ca chest/heart 12/19 score 17 Zostavax suggested Colon is due 2024 Refused shots         No orders of the defined types were placed in this encounter.     Follow-up: Return in about 6 months (around 10/11/2021) for a follow-up visit.  Walker Kehr, MD

## 2021-04-13 NOTE — Assessment & Plan Note (Addendum)
Last Colon 2021 - Dr Havery Moros; due in 2024 - adenomas

## 2021-04-26 ENCOUNTER — Other Ambulatory Visit: Payer: Self-pay | Admitting: Endocrinology

## 2021-05-08 NOTE — Assessment & Plan Note (Signed)
We discussed age appropriate health related issues, including available/recomended screening tests and vaccinations. We discussed a need for adhering to healthy diet and exercise. Labs/EKG were reviewed/ordered. All questions were answered. ?CT ca chest/heart 12/19 score 17 ?Zostavax suggested ?Colon is due 2024 ?Refused shots ?

## 2021-05-10 ENCOUNTER — Other Ambulatory Visit: Payer: Self-pay | Admitting: Internal Medicine

## 2021-06-14 ENCOUNTER — Other Ambulatory Visit (INDEPENDENT_AMBULATORY_CARE_PROVIDER_SITE_OTHER): Payer: 59

## 2021-06-14 DIAGNOSIS — E063 Autoimmune thyroiditis: Secondary | ICD-10-CM | POA: Diagnosis not present

## 2021-06-14 DIAGNOSIS — E1165 Type 2 diabetes mellitus with hyperglycemia: Secondary | ICD-10-CM | POA: Diagnosis not present

## 2021-06-14 DIAGNOSIS — E782 Mixed hyperlipidemia: Secondary | ICD-10-CM

## 2021-06-14 LAB — COMPREHENSIVE METABOLIC PANEL
ALT: 8 U/L (ref 0–53)
AST: 11 U/L (ref 0–37)
Albumin: 4.4 g/dL (ref 3.5–5.2)
Alkaline Phosphatase: 38 U/L — ABNORMAL LOW (ref 39–117)
BUN: 16 mg/dL (ref 6–23)
CO2: 31 mEq/L (ref 19–32)
Calcium: 9.2 mg/dL (ref 8.4–10.5)
Chloride: 100 mEq/L (ref 96–112)
Creatinine, Ser: 1.16 mg/dL (ref 0.40–1.50)
GFR: 64.34 mL/min (ref >60.00)
Glucose, Bld: 124 mg/dL — ABNORMAL HIGH (ref 70–99)
Potassium: 4.6 mEq/L (ref 3.5–5.1)
Sodium: 138 mEq/L (ref 135–145)
Total Bilirubin: 0.5 mg/dL (ref 0.2–1.2)
Total Protein: 6.6 g/dL (ref 6.0–8.3)

## 2021-06-14 LAB — LIPID PANEL
Cholesterol: 194 mg/dL (ref 0–200)
HDL: 56.7 mg/dL (ref >39.00)
LDL Cholesterol: 123 mg/dL — ABNORMAL HIGH (ref 0–99)
NonHDL: 137.58
Total CHOL/HDL Ratio: 3
Triglycerides: 72 mg/dL (ref 0.0–149.0)
VLDL: 14.4 mg/dL (ref 0.0–40.0)

## 2021-06-14 LAB — HEMOGLOBIN A1C: Hgb A1c MFr Bld: 7.1 % — ABNORMAL HIGH (ref 4.6–6.5)

## 2021-06-14 LAB — TSH: TSH: 2.88 u[IU]/mL (ref 0.35–5.50)

## 2021-06-15 ENCOUNTER — Other Ambulatory Visit: Payer: Self-pay | Admitting: Endocrinology

## 2021-06-20 ENCOUNTER — Encounter: Payer: Self-pay | Admitting: Endocrinology

## 2021-06-20 ENCOUNTER — Ambulatory Visit (INDEPENDENT_AMBULATORY_CARE_PROVIDER_SITE_OTHER): Payer: 59 | Admitting: Endocrinology

## 2021-06-20 VITALS — BP 140/84 | HR 71 | Ht 66.0 in | Wt 154.8 lb

## 2021-06-20 DIAGNOSIS — I10 Essential (primary) hypertension: Secondary | ICD-10-CM | POA: Diagnosis not present

## 2021-06-20 DIAGNOSIS — E782 Mixed hyperlipidemia: Secondary | ICD-10-CM | POA: Diagnosis not present

## 2021-06-20 DIAGNOSIS — E1165 Type 2 diabetes mellitus with hyperglycemia: Secondary | ICD-10-CM | POA: Diagnosis not present

## 2021-06-20 NOTE — Progress Notes (Signed)
Patient ID: Ricardo [name], male   DOB: May 18, 1950, 71 y.o.   MRN: [id number] ? ?       ? ? ?Reason for Appointment: Follow-up  ? ? ?Referring physician:  [name]  ? ?History of Present Illness:  ?        ?Diagnosis: Type 2 diabetes mellitus, date of diagnosis: 2009      ? ?Past history: At diagnosis he was having symptoms of fatigue.  He was started on glipizide initially and subsequently metformin was added.  Further details are not available ?However he has been somewhat irregular with his follow-up and did not have an A1c with his PCP between 8/13 and 11/15 ?He was referred here because of rising A1c of 7.5 ?He had been started on Trulicity in 4/17 because of gradually increasing A1c ?He stopped taking Trulicity in 2018 because of out-of-pocket expense ? ?Recent history:  ?  ? ?Oral hypoglycemic drugs : Trulicity 4.5 mg weekly, glipizide ER 2.5 mg, metformin 1 g twice a day, Actos 30 mg daily    ? ?His A1c is back down to 7.1 compared to 7.6  ? ?Previously A1c has been as low as 6.8 and highest 9.1 ? ?Current blood sugar patterns, problems identified and management: ?He says he is overall doing much better with his diet compared to his last visit except only in the last week  ?He has cut back on sweets significantly and he thinks this is helping his control  ?Today he forgot his blood sugar monitor and not clear what his blood sugars are doing  ?He usually thinks they are very well controlled in the mornings especially if he is watching his diet  ?His Actos was increased up to 30 mg on the last visit and he has no edema with this ?He likely does not check his blood sugars after dinner and mostly in the mornings are after breakfast  ?He has not started on any exercise and he prefers to do weights as he thinks his muscle tone is low ?Lab fasting glucose was 124 and he thinks his morning sugars are mostly around 120 ?      ? ?Side effects from medications have been: None ? ? ?Glucose monitoring:  done usually  one-2 time a day         Glucometer: One Touch Verio ? ?Blood Glucose readings as above ? ?Self-care:  ?Meals: 3 meals per day. Breakfast is oatmeal/cereal/eggs and toast.  Has half sandwich or soup at lunch, avoiding rice and carbs usually   ?    ? ?Dietician visit, most recent: at diagnosis              ? ?Weight history:  ? ?Wt Readings from Last 3 Encounters:  ?06/20/21 154 lb 12.8 oz (70.2 kg)  ?04/13/21 158 lb (71.7 kg)  ?03/23/21 154 lb 12.8 oz (70.2 kg)  ? ? ?Glycemic control: ?  ?Lab Results  ?Component Value Date  ? HGBA1C 7.1 (H) 06/14/2021  ? HGBA1C 7.6 (H) 03/21/2021  ? HGBA1C 7.6 (H) 12/09/2020  ? ?Lab Results  ?Component Value Date  ? MICROALBUR <0.7 02/11/2020  ? LDLCALC 123 (H) 06/14/2021  ? CREATININE 1.16 06/14/2021  ? ?Lab on 06/14/2021  ?Component Date Value Ref Range Status  ? TSH 06/14/2021 2.88  0.35 - 5.50 uIU/mL Final  ? Cholesterol 06/14/2021 194  0 - 200 mg/dL Final  ? ATP III Classification       Desirable:  < 200 mg/dL  Borderline High:  200 - 239 mg/dL          High:  > = 240 mg/dL  ? Triglycerides 06/14/2021 72.0  0.0 - 149.0 mg/dL Final  ? Normal:  <150 mg/dLBorderline High:  150 - 199 mg/dL  ? HDL 06/14/2021 56.70  >39.00 mg/dL Final  ? VLDL 06/14/2021 14.4  0.0 - 40.0 mg/dL Final  ? LDL Cholesterol 06/14/2021 123 (H)  0 - 99 mg/dL Final  ? Total CHOL/HDL Ratio 06/14/2021 3   Final  ?                Men          Women1/2 Average Risk     3.4          3.3Average Risk          5.0          4.42X Average Risk          9.6          7.13X Average Risk          15.0          11.0                      ? NonHDL 06/14/2021 137.58   Final  ? NOTE:  Non-HDL goal should be 30 mg/dL higher than patient's LDL goal (i.e. LDL goal of < 70 mg/dL, would have non-HDL goal of < 100 mg/dL)  ? Sodium 06/14/2021 138  135 - 145 mEq/L Final  ? Potassium 06/14/2021 4.6  3.5 - 5.1 mEq/L Final  ? Chloride 06/14/2021 100  96 - 112 mEq/L Final  ? CO2 06/14/2021 31  19 - 32 mEq/L Final  ? Glucose,  Bld 06/14/2021 124 (H)  70 - 99 mg/dL Final  ? BUN 06/14/2021 16  6 - 23 mg/dL Final  ? Creatinine, Ser 06/14/2021 1.16  0.40 - 1.50 mg/dL Final  ? Total Bilirubin 06/14/2021 0.5  0.2 - 1.2 mg/dL Final  ? Alkaline Phosphatase 06/14/2021 38 (L)  39 - 117 U/L Final  ? AST 06/14/2021 11  0 - 37 U/L Final  ? ALT 06/14/2021 8  0 - 53 U/L Final  ? Total Protein 06/14/2021 6.6  6.0 - 8.3 g/dL Final  ? Albumin 06/14/2021 4.4  3.5 - 5.2 g/dL Final  ? GFR 06/14/2021 64.34  >60.00 mL/min Final  ? Calculated using the CKD-EPI Creatinine Equation (2021)  ? Calcium 06/14/2021 9.2  8.4 - 10.5 mg/dL Final  ? Hgb A1c MFr Bld 06/14/2021 7.1 (H)  4.6 - 6.5 % Final  ? Glycemic Control Guidelines for People with Diabetes:Non Diabetic:  <6%Goal of Therapy: <7%Additional Action Suggested:  >8%   ? ? ? ?Allergies as of 06/20/2021   ?No Known Allergies ?  ? ?  ?Medication List  ?  ? ?  ? Accurate as of June 20, 2021  9:11 AM. If you have any questions, ask your nurse or doctor.  ?  ?  ? ?  ? ?alfuzosin 10 MG 24 hr tablet ?Commonly known as: UROXATRAL ?Take 10 mg by mouth daily with breakfast. ?  ?aspirin 81 MG tablet ?Take 81 mg by mouth daily. ?  ?atorvastatin 20 MG tablet ?Commonly known as: LIPITOR ?TAKE 1 TABLET BY MOUTH EVERY DAY *KEEP APPOINTMENT FOR FUTURE REFILLS* ?  ?dutasteride 0.5 MG capsule ?Commonly known as: AVODART ?Take 0.5 mg by mouth daily. ?  ?glipiZIDE 2.5 MG 24  hr tablet ?Commonly known as: GLUCOTROL XL ?TAKE 1 TABLET BY MOUTH EVERY DAY WITH BREAKFAST ?  ?glucose blood test strip ?Use as instructed. .Check sugar 3-4 times daily. E11.65 ?  ?levothyroxine 100 MCG tablet ?Commonly known as: SYNTHROID ?TAKE 1 TABLET BY MOUTH EVERY DAY ?  ?losartan 100 MG tablet ?Commonly known as: COZAAR ?TAKE 1 TABLET BY MOUTH ONCE DAILY ?  ?metFORMIN 500 MG 24 hr tablet ?Commonly known as: GLUCOPHAGE-XR ?Take 4 tablets (2,000 mg total) by mouth daily with supper. ?  ?onetouch ultrasoft lancets ?Use as instructed.Check sugar 3-4 times  daily. E11.65 ?  ?OneTouch Verio w/Device Kit ?Check sugar 3-4 times daily. E11.65 ?  ?pioglitazone 30 MG tablet ?Commonly known as: Actos ?Take 1 tablet (30 mg total) by mouth daily. ?  ?Trulicity 4.5 WH/6.7RF Sopn ?Generic drug: Dulaglutide ?INJECT 4.5MG  AS DIRECTED ONCE A WEEK. ?  ?valACYclovir 500 MG tablet ?Commonly known as: VALTREX ?TAKE 1 TABLET BY MOUTH EVERY DAY **OFFICE VISIT NEEDED FOR MORE REFILLS ?  ? ?  ? ? ?Allergies: No Known Allergies ? ?Past Medical History:  ?Diagnosis Date  ? ARTHRITIS, HIP 08/10/2008  ? BURSITIS, RIGHT SHOULDER 05/20/2008  ? Degeneration of lumbar or lumbosacral intervertebral disc 11/27/2006  ? DEGENERATION, CERVICAL DISC 11/27/2006  ? Degenerative disc disease, lumbar   ? Depression   ? no per pt 03-30-19  ? DEPRESSION 11/25/2006  ? Diabetes mellitus   ? DIABETES MELLITUS, TYPE II 04/10/2007  ? Epicondylitis   ? bilateral  ? Fatigue   ? HERPES GENITALIS 09/17/2007  ? Hyperlipemia   ? HYPERLIPIDEMIA 03/31/2007  ? Hypertension   ? HYPERTENSION 11/25/2006  ? HYPOTHYROIDISM 10/28/2007  ? Impaired fasting glucose 12/26/2006  ? Lateral epicondylitis  of elbow 05/05/2007  ? Sciatica 10/28/2007  ? SHOULDER PAIN, RIGHT 08/18/2008  ? ? ?Past Surgical History:  ?Procedure Laterality Date  ? ACHILLES TENDON REPAIR    ? left  ? COLONOSCOPY    ? ELBOW SURGERY  2005  ? left  ? herniated lumbar disc  1997, 2011  ? KNEE ARTHROSCOPY  2007  ? left  ? [place]  ? right  ? ? ?Family History  ?Problem Relation Age of Onset  ? Diabetes Mother   ? Heart disease Mother   ? Heart disease Father   ? Diabetes Sister   ? Colon cancer Neg Hx   ? Esophageal cancer Neg Hx   ? Rectal cancer Neg Hx   ? Stomach cancer Neg Hx   ? ? ?Social History:  reports that he has never smoked. He has never used smokeless tobacco. He reports current alcohol use of about 3.0 standard drinks per week. He reports that he does not use drugs. ? ?  ?Review of Systems  ? ?  ?    Lipids: He has been treated with Lipitor 20  mg ? ?Again he has been irregular with taking this, he was told to take it with his evening meal but he was trying to take it later on and will forget ?LDL is again over 100 ? ?     ?Lab Results  ?Component Value Da

## 2021-06-20 NOTE — Patient Instructions (Addendum)
Check blood sugars on waking up 2-3 days a week ? ?Also check blood sugars about 2 hours after meals and do this after different meals by rotation ? ?Recommended blood sugar levels on waking up are 90-130 and about 2 hours after meal is 130-160 ? ?Please bring your blood sugar monitor to each visit, thank you ? ?Exercise daily ? ?Take Lipitor in am ? ?Check BP at home ?

## 2021-08-17 ENCOUNTER — Other Ambulatory Visit: Payer: Self-pay | Admitting: Endocrinology

## 2021-08-17 DIAGNOSIS — E1165 Type 2 diabetes mellitus with hyperglycemia: Secondary | ICD-10-CM

## 2021-08-22 ENCOUNTER — Ambulatory Visit (INDEPENDENT_AMBULATORY_CARE_PROVIDER_SITE_OTHER): Payer: 59

## 2021-08-22 ENCOUNTER — Ambulatory Visit (INDEPENDENT_AMBULATORY_CARE_PROVIDER_SITE_OTHER): Payer: 59 | Admitting: Family Medicine

## 2021-08-22 ENCOUNTER — Ambulatory Visit: Payer: Self-pay

## 2021-08-22 VITALS — BP 122/76 | HR 70 | Ht 66.0 in | Wt 154.0 lb

## 2021-08-22 DIAGNOSIS — M25561 Pain in right knee: Secondary | ICD-10-CM

## 2021-08-23 NOTE — Progress Notes (Signed)
Right knee x-ray looks normal to radiology

## 2021-09-07 ENCOUNTER — Telehealth: Payer: Self-pay | Admitting: Family Medicine

## 2021-09-07 MED ORDER — MELOXICAM 15 MG PO TABS: 15.0000 mg | ORAL_TABLET | Freq: Every day | ORAL | 0 refills | Status: DC

## 2021-09-07 NOTE — Telephone Encounter (Signed)
Patient called stating that he is currently out of town and his knee is bothering him. He said that it feels like bursitis and is inflamed. He asked if an anti-inflammatory could be sent in for him?  Pharmacy: [facility] [place], [place]   Please advise.

## 2021-09-07 NOTE — Telephone Encounter (Signed)
I sent the anti-inflammatory medication meloxicam to your pharmacy.  This medication like many anti-inflammatories can be tough on your kidneys and stomach so its not something that we want to take in for very long.

## 2021-09-07 NOTE — Telephone Encounter (Signed)
Called pt and relayed Dr. Clovis Riley advice regarding Meloxicam.  Pt verbalized understanding.

## 2021-10-09 ENCOUNTER — Other Ambulatory Visit: Payer: Self-pay | Admitting: *Deleted

## 2021-10-09 MED ORDER — LEVOTHYROXINE SODIUM 100 MCG PO TABS: 100.0000 ug | ORAL_TABLET | Freq: Every day | ORAL | 1 refills | Status: DC

## 2021-10-23 ENCOUNTER — Other Ambulatory Visit: Payer: Self-pay | Admitting: Internal Medicine

## 2021-10-24 ENCOUNTER — Other Ambulatory Visit: Payer: Self-pay

## 2021-10-24 DIAGNOSIS — E1165 Type 2 diabetes mellitus with hyperglycemia: Secondary | ICD-10-CM

## 2021-10-24 MED ORDER — METFORMIN HCL 1000 MG PO TABS: 1000.0000 mg | ORAL_TABLET | Freq: Two times a day (BID) | ORAL | 3 refills | Status: DC

## 2021-10-31 NOTE — Progress Notes (Unsigned)
   I, [name], LAT, ATC acting as a scribe for Ricardo Leader, MD.  Ricardo [name] is a 71 y.o. male who presents to [place] at [facility] today for f/u acute-on-chronic R knee pain concerning for a meniscal tear. Pt was trying to get up into his trailer and his R knee buckled inward and he felt a "tug/pop." Pt was last seen by Dr. Georgina Snell on 08/22/21 and was given a R knee steroid injection and was advised to use Voltaren gel and a bit of watchful waiting. Pt called the office on 7/13 c/o increased knee pain while out of town and Meloxicam was prescribed. Today, pt reports  Dx imaging: 08/22/21 R knee XR  05/26/19 R knee XR  Pertinent review of systems: ***  Relevant historical information: ***   Exam:  There were no vitals taken for this visit. General: Well Developed, well nourished, and in no acute distress.   MSK: ***    Lab and Radiology Results No results found for this or any previous visit (from the past 72 hour(s)). No results found.     Assessment and Plan: 71 y.o. male with ***   PDMP not reviewed this encounter. No orders of the defined types were placed in this encounter.  No orders of the defined types were placed in this encounter.    Discussed warning signs or symptoms. Please see discharge instructions. Patient expresses understanding.   ***

## 2021-11-01 ENCOUNTER — Ambulatory Visit (INDEPENDENT_AMBULATORY_CARE_PROVIDER_SITE_OTHER): Payer: 59 | Admitting: Family Medicine

## 2021-11-01 VITALS — BP 112/68 | HR 71 | Ht 66.0 in | Wt 149.0 lb

## 2021-11-01 DIAGNOSIS — M25561 Pain in right knee: Secondary | ICD-10-CM | POA: Diagnosis not present

## 2021-11-01 NOTE — Patient Instructions (Addendum)
Thank you for coming in today.   Recheck as needed.   We can do another shot as soon as the end of September.   We can do gel shots with a little warning.  Let me know if this is not working.

## 2021-11-07 ENCOUNTER — Other Ambulatory Visit (INDEPENDENT_AMBULATORY_CARE_PROVIDER_SITE_OTHER): Payer: 59

## 2021-11-07 DIAGNOSIS — E1165 Type 2 diabetes mellitus with hyperglycemia: Secondary | ICD-10-CM | POA: Diagnosis not present

## 2021-11-07 DIAGNOSIS — E782 Mixed hyperlipidemia: Secondary | ICD-10-CM | POA: Diagnosis not present

## 2021-11-07 LAB — COMPREHENSIVE METABOLIC PANEL
ALT: 17 U/L (ref 0–53)
AST: 15 U/L (ref 0–37)
Albumin: 4.1 g/dL (ref 3.5–5.2)
Alkaline Phosphatase: 36 U/L — ABNORMAL LOW (ref 39–117)
BUN: 16 mg/dL (ref 6–23)
CO2: 32 mEq/L (ref 19–32)
Calcium: 9.8 mg/dL (ref 8.4–10.5)
Chloride: 100 mEq/L (ref 96–112)
Creatinine, Ser: 1.24 mg/dL (ref 0.40–1.50)
GFR: 59.23 mL/min — ABNORMAL LOW (ref >60.00)
Glucose, Bld: 160 mg/dL — ABNORMAL HIGH (ref 70–99)
Potassium: 4.4 mEq/L (ref 3.5–5.1)
Sodium: 138 mEq/L (ref 135–145)
Total Bilirubin: 0.7 mg/dL (ref 0.2–1.2)
Total Protein: 7 g/dL (ref 6.0–8.3)

## 2021-11-07 LAB — MICROALBUMIN / CREATININE URINE RATIO
Creatinine,U: 84 mg/dL
Microalb Creat Ratio: 0.8 mg/g (ref 0.0–30.0)
Microalb, Ur: <0.7 mg/dL (ref 0.0–1.9)

## 2021-11-07 LAB — LIPID PANEL
Cholesterol: 136 mg/dL (ref 0–200)
HDL: 63.3 mg/dL (ref >39.00)
LDL Cholesterol: 63 mg/dL (ref 0–99)
NonHDL: 73.16
Total CHOL/HDL Ratio: 2
Triglycerides: 53 mg/dL (ref 0.0–149.0)
VLDL: 10.6 mg/dL (ref 0.0–40.0)

## 2021-11-07 LAB — HEMOGLOBIN A1C: Hgb A1c MFr Bld: 8 % — ABNORMAL HIGH (ref 4.6–6.5)

## 2021-11-09 ENCOUNTER — Ambulatory Visit (INDEPENDENT_AMBULATORY_CARE_PROVIDER_SITE_OTHER): Payer: 59 | Admitting: Endocrinology

## 2021-11-09 ENCOUNTER — Encounter: Payer: Self-pay | Admitting: Endocrinology

## 2021-11-09 VITALS — BP 122/78 | HR 61 | Ht 66.0 in | Wt 151.8 lb

## 2021-11-09 DIAGNOSIS — E785 Hyperlipidemia, unspecified: Secondary | ICD-10-CM

## 2021-11-09 DIAGNOSIS — I10 Essential (primary) hypertension: Secondary | ICD-10-CM

## 2021-11-09 DIAGNOSIS — E1165 Type 2 diabetes mellitus with hyperglycemia: Secondary | ICD-10-CM | POA: Diagnosis not present

## 2021-11-09 DIAGNOSIS — E063 Autoimmune thyroiditis: Secondary | ICD-10-CM

## 2021-11-09 MED ORDER — TIRZEPATIDE 2.5 MG/0.5ML ~~LOC~~ SOAJ: 2.5000 mg | SUBCUTANEOUS | 0 refills | Status: DC

## 2021-11-09 NOTE — Progress Notes (Signed)
Patient ID: Ricardo [name], male   DOB: 05/28/1950, 71 y.o.   MRN: [id number]           Reason for Appointment: Follow-up    Referring physician:  [name]   History of Present Illness:          Diagnosis: Type 2 diabetes mellitus, date of diagnosis: 2009       Past history: At diagnosis he was having symptoms of fatigue.  He was started on glipizide initially and subsequently metformin was added.  Further details are not available However he has been somewhat irregular with his follow-up and did not have an A1c with his PCP between 8/13 and 11/15 He was referred here because of rising A1c of 7.5 He had been started on Trulicity in 4/33 because of gradually increasing A1c He stopped taking Trulicity in 2951 because of out-of-pocket expense  Recent history:     Oral hypoglycemic drugs : Trulicity 4.5 mg weekly, not taking, glipizide ER 2.5 mg, metformin 1 g twice a day, Actos 30 mg daily     His A1c is back up to 8% compared to 7.1 His previously A1c has been as low as 6.8 and highest 9.1  Current blood sugar patterns, problems identified and management: He has not had his Trulicity in 2 months He said it was causing abdominal distress which had happened before also and he stopped the medication without letting us know  Also he has had some knee pain and has not taken his oral medications regularly He has however checked his blood sugars more consistently  Although his blood sugars have been fluctuating they have been as high as 263 and was 198 yesterday morning  Again diet may be somewhat variable including some snacks late at night Not able to exercise lately No significant weight changes Lab fasting glucose 160  Side effects from medications have been: None   Glucose monitoring:  done usually one-2 time a day         Glucometer: One Touch Verio  Blood Glucose readings from download   PRE-MEAL Fasting Lunch Dinner Bedtime Overall  Glucose range: 112-198 115-263  107-179  91-263  Mean/median: 140  151  147   POST-MEAL PC Breakfast PC Lunch PC Dinner  Glucose range:     Mean/median: 174  125     Self-care:  Meals: 3 meals per day. Breakfast is oatmeal/cereal/eggs and toast.  Has half sandwich or soup at lunch, avoiding rice and carbs usually   Dinner at 5-6 pm  Dietician visit, most recent: at diagnosis               Weight history:   Wt Readings from Last 3 Encounters:  11/09/21 151 lb 12.8 oz (68.9 kg)  11/01/21 149 lb (67.6 kg)  08/22/21 154 lb (69.9 kg)    Glycemic control:   Lab Results  Component Value Date   HGBA1C 8.0 (H) 11/07/2021   HGBA1C 7.1 (H) 06/14/2021   HGBA1C 7.6 (H) 03/21/2021   Lab Results  Component Value Date   MICROALBUR <0.7 11/07/2021   LDLCALC 63 11/07/2021   CREATININE 1.24 11/07/2021   Lab on 11/07/2021  Component Date Value Ref Range Status   Microalb, Ur 11/07/2021 <0.7  0.0 - 1.9 mg/dL Final   Creatinine,U 11/07/2021 84.0  mg/dL Final   Microalb Creat Ratio 11/07/2021 0.8  0.0 - 30.0 mg/g Final   Cholesterol 11/07/2021 136  0 - 200 mg/dL Final   ATP III Classification  Desirable:  < 200 mg/dL               Borderline High:  200 - 239 mg/dL          High:  > = 240 mg/dL   Triglycerides 11/07/2021 53.0  0.0 - 149.0 mg/dL Final   Normal:  <150 mg/dLBorderline High:  150 - 199 mg/dL   HDL 11/07/2021 63.30  >39.00 mg/dL Final   VLDL 11/07/2021 10.6  0.0 - 40.0 mg/dL Final   LDL Cholesterol 11/07/2021 63  0 - 99 mg/dL Final   Total CHOL/HDL Ratio 11/07/2021 2   Final                  Men          Women1/2 Average Risk     3.4          3.3Average Risk          5.0          4.42X Average Risk          9.6          7.13X Average Risk          15.0          11.0                       NonHDL 11/07/2021 73.16   Final   NOTE:  Non-HDL goal should be 30 mg/dL higher than patient's LDL goal (i.e. LDL goal of < 70 mg/dL, would have non-HDL goal of < 100 mg/dL)   Sodium 11/07/2021 138  135 - 145 mEq/L  Final   Potassium 11/07/2021 4.4  3.5 - 5.1 mEq/L Final   Chloride 11/07/2021 100  96 - 112 mEq/L Final   CO2 11/07/2021 32  19 - 32 mEq/L Final   Glucose, Bld 11/07/2021 160 (H)  70 - 99 mg/dL Final   BUN 11/07/2021 16  6 - 23 mg/dL Final   Creatinine, Ser 11/07/2021 1.24  0.40 - 1.50 mg/dL Final   Total Bilirubin 11/07/2021 0.7  0.2 - 1.2 mg/dL Final   Alkaline Phosphatase 11/07/2021 36 (L)  39 - 117 U/L Final   AST 11/07/2021 15  0 - 37 U/L Final   ALT 11/07/2021 17  0 - 53 U/L Final   Total Protein 11/07/2021 7.0  6.0 - 8.3 g/dL Final   Albumin 11/07/2021 4.1  3.5 - 5.2 g/dL Final   GFR 11/07/2021 59.23 (L)  >60.00 mL/min Final   Calculated using the CKD-EPI Creatinine Equation (2021)   Calcium 11/07/2021 9.8  8.4 - 10.5 mg/dL Final   Hgb A1c MFr Bld 11/07/2021 8.0 (H)  4.6 - 6.5 % Final   Glycemic Control Guidelines for People with Diabetes:Non Diabetic:  <6%Goal of Therapy: <7%Additional Action Suggested:  >8%      Allergies as of 11/09/2021   No Known Allergies      Medication List        Accurate as of November 09, 2021  8:47 AM. If you have any questions, ask your nurse or doctor.          alfuzosin 10 MG 24 hr tablet Commonly known as: UROXATRAL Take 10 mg by mouth daily with breakfast.   aspirin 81 MG tablet Take 81 mg by mouth daily.   atorvastatin 20 MG tablet Commonly known as: LIPITOR TAKE 1 TABLET BY MOUTH EVERY DAY *KEEP APPOINTMENT FOR FUTURE REFILLS*   dutasteride  0.5 MG capsule Commonly known as: AVODART Take 0.5 mg by mouth daily.   glipiZIDE 2.5 MG 24 hr tablet Commonly known as: GLUCOTROL XL TAKE 1 TABLET BY MOUTH EVERY DAY WITH BREAKFAST   levothyroxine 100 MCG tablet Commonly known as: SYNTHROID Take 1 tablet (100 mcg total) by mouth daily.   losartan 100 MG tablet Commonly known as: COZAAR TAKE 1 TABLET BY MOUTH ONCE DAILY   meloxicam 15 MG tablet Commonly known as: MOBIC Take 1 tablet (15 mg total) by mouth daily.    metFORMIN 1000 MG tablet Commonly known as: GLUCOPHAGE Take 1 tablet (1,000 mg total) by mouth 2 (two) times daily with a meal.   onetouch ultrasoft lancets Use as instructed.Check sugar 3-4 times daily. E11.65   OneTouch Verio test strip Generic drug: glucose blood TEST 3 TO 4 TIMES DAILY AS DIRECTED   OneTouch Verio w/Device Kit Check sugar 3-4 times daily. E11.65   pioglitazone 30 MG tablet Commonly known as: Actos Take 1 tablet (30 mg total) by mouth daily.   Trulicity 4.5 WI/0.9BD Sopn Generic drug: Dulaglutide INJECT 4.5MG AS DIRECTED ONCE A WEEK.   valACYclovir 500 MG tablet Commonly known as: VALTREX Take 1 tablet (500 mg total) by mouth daily.        Allergies: No Known Allergies  Past Medical History:  Diagnosis Date   ARTHRITIS, HIP 08/10/2008   BURSITIS, RIGHT SHOULDER 05/20/2008   Degeneration of lumbar or lumbosacral intervertebral disc 11/27/2006   DEGENERATION, CERVICAL DISC 11/27/2006   Degenerative disc disease, lumbar    Depression    no per pt 03-30-19   DEPRESSION 11/25/2006   Diabetes mellitus    DIABETES MELLITUS, TYPE II 04/10/2007   Epicondylitis    bilateral   Fatigue    HERPES GENITALIS 09/17/2007   Hyperlipemia    HYPERLIPIDEMIA 03/31/2007   Hypertension    HYPERTENSION 11/25/2006   HYPOTHYROIDISM 10/28/2007   Impaired fasting glucose 12/26/2006   Lateral epicondylitis  of elbow 05/05/2007   Sciatica 10/28/2007   SHOULDER PAIN, RIGHT 08/18/2008    Past Surgical History:  Procedure Laterality Date   ACHILLES TENDON REPAIR     left   COLONOSCOPY     ELBOW SURGERY  2005   left   herniated lumbar disc  1997, 2011   KNEE ARTHROSCOPY  2007   left   [place]   right    Family History  Problem Relation Age of Onset   Diabetes Mother    Heart disease Mother    Heart disease Father    Diabetes Sister    Colon cancer Neg Hx    Esophageal cancer Neg Hx    Rectal cancer Neg Hx    Stomach cancer Neg Hx     Social  History:  reports that he has never smoked. He has never used smokeless tobacco. He reports current alcohol use of about 3.0 standard drinks of alcohol per week. He reports that he does not use drugs.    Review of Systems         Lipids: He has been treated with Lipitor 20 mg  LDL is much better with taking Lipitor regularly       Lab Results  Component Value Date   CHOL 136 11/07/2021   CHOL 194 06/14/2021   CHOL 184 03/21/2021   Lab Results  Component Value Date   HDL 63.30 11/07/2021   HDL 56.70 06/14/2021   HDL 52.60 03/21/2021   Lab Results  Component Value Date   LDLCALC 63 11/07/2021   LDLCALC 123 (H) 06/14/2021   LDLCALC 115 (H) 03/21/2021   Lab Results  Component Value Date   TRIG 53.0 11/07/2021   TRIG 72.0 06/14/2021   TRIG 79.0 03/21/2021   Lab Results  Component Value Date   CHOLHDL 2 11/07/2021   CHOLHDL 3 06/14/2021   CHOLHDL 3 03/21/2021   Lab Results  Component Value Date   LDLDIRECT 79.0 07/23/2018   LDLDIRECT 126.0 05/12/2014   LDLDIRECT 138.1 12/23/2012                    Thyroid: He has been on Synthroid for several years years, initially had symptoms of fatigue at diagnosis He is on 100 mcg levothyroxine   He has taken this more consistently before eating in the morning  TSH consistently normal  Lab Results  Component Value Date   FREET4 0.85 03/21/2021   TSH 2.88 06/14/2021   TSH 4.68 03/21/2021   TSH 2.56 06/09/2020     Followed by pulmonologist for pulmonary fibrosis  HYPERTENSION: Blood pressure is controlled on Losartan, he however thinks he has significant whitecoat syndrome Not monitoring at home  BP Readings from Last 3 Encounters:  11/09/21 122/78  11/01/21 112/68  08/22/21 122/76     LABS:  Lab on 11/07/2021  Component Date Value Ref Range Status   Microalb, Ur 11/07/2021 <0.7  0.0 - 1.9 mg/dL Final   Creatinine,U 11/07/2021 84.0  mg/dL Final   Microalb Creat Ratio 11/07/2021 0.8  0.0 - 30.0 mg/g Final    Cholesterol 11/07/2021 136  0 - 200 mg/dL Final   ATP III Classification       Desirable:  < 200 mg/dL               Borderline High:  200 - 239 mg/dL          High:  > = 240 mg/dL   Triglycerides 11/07/2021 53.0  0.0 - 149.0 mg/dL Final   Normal:  <150 mg/dLBorderline High:  150 - 199 mg/dL   HDL 11/07/2021 63.30  >39.00 mg/dL Final   VLDL 11/07/2021 10.6  0.0 - 40.0 mg/dL Final   LDL Cholesterol 11/07/2021 63  0 - 99 mg/dL Final   Total CHOL/HDL Ratio 11/07/2021 2   Final                  Men          Women1/2 Average Risk     3.4          3.3Average Risk          5.0          4.42X Average Risk          9.6          7.13X Average Risk          15.0          11.0                       NonHDL 11/07/2021 73.16   Final   NOTE:  Non-HDL goal should be 30 mg/dL higher than patient's LDL goal (i.e. LDL goal of < 70 mg/dL, would have non-HDL goal of < 100 mg/dL)   Sodium 11/07/2021 138  135 - 145 mEq/L Final   Potassium 11/07/2021 4.4  3.5 - 5.1 mEq/L Final   Chloride 11/07/2021 100  96 - 112 mEq/L Final  CO2 11/07/2021 32  19 - 32 mEq/L Final   Glucose, Bld 11/07/2021 160 (H)  70 - 99 mg/dL Final   BUN 11/07/2021 16  6 - 23 mg/dL Final   Creatinine, Ser 11/07/2021 1.24  0.40 - 1.50 mg/dL Final   Total Bilirubin 11/07/2021 0.7  0.2 - 1.2 mg/dL Final   Alkaline Phosphatase 11/07/2021 36 (L)  39 - 117 U/L Final   AST 11/07/2021 15  0 - 37 U/L Final   ALT 11/07/2021 17  0 - 53 U/L Final   Total Protein 11/07/2021 7.0  6.0 - 8.3 g/dL Final   Albumin 11/07/2021 4.1  3.5 - 5.2 g/dL Final   GFR 11/07/2021 59.23 (L)  >60.00 mL/min Final   Calculated using the CKD-EPI Creatinine Equation (2021)   Calcium 11/07/2021 9.8  8.4 - 10.5 mg/dL Final   Hgb A1c MFr Bld 11/07/2021 8.0 (H)  4.6 - 6.5 % Final   Glycemic Control Guidelines for People with Diabetes:Non Diabetic:  <6%Goal of Therapy: <7%Additional Action Suggested:  >8%        Physical Examination:  BP 122/78   Pulse 61   Ht _0   (1.676 m)   Wt 151 lb 12.8 oz (68.9 kg)   SpO2 99%   BMI 24.50 kg/m              ASSESSMENT:  Diabetes type 2, nonobese.   See history of present illness for detailed discussion of his current management, blood sugar patterns and problems identified  A1c has gone back up to 8%   He is on a regimen with Actos, 2.5 mg glipizide ER and metformin  Blood sugars are now higher with stopping Trulicity and irregular use of medications as well as inconsistent diet and exercise regimen Blood sugars are as high as 263 at home He could not tolerate 4.5 mg dose of Trulicity  Urine microalbumin normal  LIPIDS: Now well controlled with LDL 63 and better compliance with Lipitor  PLAN:  Trial of MOUNJARO instead of Trulicity He can use a 2.5 mg dose for the first 4 weeks and then 5 mg weekly Will do prior to transition if needed Discussed that he can try to start a exercise program with anything that he can tolerate and continue regularly Consistent diet with cutting back on meals and snacks Call if having any side effect If he is not able to tolerate Mounjaro if he has no coverage for this will go back to Trulicity and lower doses   Follow-up in 3 months   There are no Patient Instructions on file for this visit.  Elayne Snare 11/09/2021, 8:47 AM   Note: This office note was prepared with Dragon voice recognition system technology. Any transcriptional errors that result from this process are unintentional.

## 2021-11-15 ENCOUNTER — Other Ambulatory Visit (HOSPITAL_COMMUNITY): Payer: Self-pay

## 2021-11-15 ENCOUNTER — Telehealth: Payer: Self-pay

## 2021-11-15 NOTE — Telephone Encounter (Signed)
Prior Authorization for [employer] 2.'5MG'$ /0.5ML pen-injectors is pending.  Key: [id number]  11/15/21

## 2021-11-16 ENCOUNTER — Other Ambulatory Visit: Payer: Self-pay | Admitting: Endocrinology

## 2021-11-16 ENCOUNTER — Other Ambulatory Visit (HOSPITAL_COMMUNITY): Payer: Self-pay

## 2021-11-16 NOTE — Telephone Encounter (Signed)
Patient Advocate Encounter  Received a fax from [employer] regarding Prior Authorization for Mounjaro 2.'5MG'$ /0.5ML pen-injectors.   Authorization has been DENIED due to plan only covers this drug when you meet one of these options: A) you have tried other drugs your plan covers (preferred drugs), and they did not work well for you, or B) your doctor gives [place] medical reason you cannot take those other drugs. For your plan, you may need to try up to three preferred drugs.  Determination letter attached to patients chart.

## 2021-11-17 NOTE — Telephone Encounter (Signed)
Alternative Requested:INSURANCE WANTS RYBELSUS,TRULICITY, OR VICTOZA.

## 2021-11-20 ENCOUNTER — Other Ambulatory Visit (HOSPITAL_COMMUNITY): Payer: Self-pay

## 2021-11-20 NOTE — Telephone Encounter (Signed)
Outside of Trulicity, insurance prefers Victoza [phone]), Ozempic ($268.18), or Rybelsus ($268.18). Or documentation why patient cannot use these medications and/or why Darcel Bayley is medically necessary.

## 2021-12-04 ENCOUNTER — Telehealth: Payer: Self-pay

## 2021-12-04 DIAGNOSIS — E1165 Type 2 diabetes mellitus with hyperglycemia: Secondary | ICD-10-CM

## 2021-12-04 NOTE — Telephone Encounter (Signed)
Trulicity 4.'5mg'$  is too strong makes patient very nauseas and upsets his stomach and he says he can't take it.

## 2021-12-05 MED ORDER — TRULICITY 3 MG/0.5ML ~~LOC~~ SOAJ: 3.0000 mg | SUBCUTANEOUS | 1 refills | Status: DC

## 2022-02-04 ENCOUNTER — Other Ambulatory Visit: Payer: Self-pay | Admitting: Endocrinology

## 2022-02-08 ENCOUNTER — Other Ambulatory Visit (INDEPENDENT_AMBULATORY_CARE_PROVIDER_SITE_OTHER): Payer: 59

## 2022-02-08 DIAGNOSIS — E063 Autoimmune thyroiditis: Secondary | ICD-10-CM

## 2022-02-08 DIAGNOSIS — E1165 Type 2 diabetes mellitus with hyperglycemia: Secondary | ICD-10-CM | POA: Diagnosis not present

## 2022-02-08 LAB — HEMOGLOBIN A1C: Hgb A1c MFr Bld: 8.1 % — ABNORMAL HIGH (ref 4.6–6.5)

## 2022-02-08 LAB — BASIC METABOLIC PANEL
BUN: 20 mg/dL (ref 6–23)
CO2: 32 mEq/L (ref 19–32)
Calcium: 9.9 mg/dL (ref 8.4–10.5)
Chloride: 101 mEq/L (ref 96–112)
Creatinine, Ser: 1.28 mg/dL (ref 0.40–1.50)
GFR: 56.91 mL/min — ABNORMAL LOW (ref >60.00)
Glucose, Bld: 131 mg/dL — ABNORMAL HIGH (ref 70–99)
Potassium: 4.6 mEq/L (ref 3.5–5.1)
Sodium: 138 mEq/L (ref 135–145)

## 2022-02-08 LAB — TSH: TSH: 2.13 u[IU]/mL (ref 0.35–5.50)

## 2022-02-13 ENCOUNTER — Encounter: Payer: Self-pay | Admitting: Endocrinology

## 2022-02-13 ENCOUNTER — Ambulatory Visit (INDEPENDENT_AMBULATORY_CARE_PROVIDER_SITE_OTHER): Payer: 59 | Admitting: Endocrinology

## 2022-02-13 VITALS — BP 142/78 | HR 72 | Ht 66.0 in

## 2022-02-13 DIAGNOSIS — E1165 Type 2 diabetes mellitus with hyperglycemia: Secondary | ICD-10-CM | POA: Diagnosis not present

## 2022-02-13 DIAGNOSIS — E063 Autoimmune thyroiditis: Secondary | ICD-10-CM | POA: Diagnosis not present

## 2022-02-13 DIAGNOSIS — I10 Essential (primary) hypertension: Secondary | ICD-10-CM

## 2022-02-13 DIAGNOSIS — E782 Mixed hyperlipidemia: Secondary | ICD-10-CM

## 2022-02-13 MED ORDER — EMPAGLIFLOZIN 10 MG PO TABS: 10.0000 mg | ORAL_TABLET | Freq: Every day | ORAL | 3 refills | Status: DC

## 2022-02-13 NOTE — Progress Notes (Unsigned)
Patient ID: Ricardo [name], male   DOB: 09/30/1950, 71 y.o.   MRN: [id number]           Reason for Appointment: Follow-up    Referring physician:  [name]   History of Present Illness:          Diagnosis: Type 2 diabetes mellitus, date of diagnosis: 2009       Past history: At diagnosis he was having symptoms of fatigue.  He was started on glipizide initially and subsequently metformin was added.  Further details are not available However he has been somewhat irregular with his follow-up and did not have an A1c with his PCP between 8/13 and 11/15 He was referred here because of rising A1c of 7.5 He had been started on Trulicity in 2/48 because of gradually increasing A1c He stopped taking Trulicity in 2500 because of out-of-pocket expense  Recent history:     Oral hypoglycemic drugs : Trulicity 3, glipizide ER 2.5 mg, metformin 1 g twice a day, Actos 30 mg daily     His A1c is back up to 8% compared to 7.1 His previously A1c has been as low as 6.8 and highest 9.1  Current blood sugar patterns, problems identified and management: He has not had his Trulicity 6 weeks He said it was causing abdominal distress which had happened before also and he stopped the medication without letting us know  Also he has had some knee pain and has not taken his oral medications regularly He has however checked his blood sugars more consistently  Although his blood sugars have been fluctuating they have been as high as 263 and was 198 yesterday morning  Again diet may be somewhat variable including some snacks late at night Not able to exercise lately, knee pain No significant weight changes Lab fasting glucose 131  Side effects from medications have been: None   Glucose monitoring:  done usually one-2 time a day         Glucometer: One Touch Verio  Blood Glucose readings from download   PRE-MEAL Fasting Lunch Dinner Bedtime Overall  Glucose range:       Mean/median:     138    POST-MEAL PC Breakfast PC Lunch PC Dinner  Glucose range:     Mean/median:        PRE-MEAL Fasting Lunch Dinner Bedtime Overall  Glucose range: 112-198 115-263 107-179  91-263  Mean/median: 140  151  147   POST-MEAL PC Breakfast PC Lunch PC Dinner  Glucose range:     Mean/median: 174  125     Self-care:  Meals: 3 meals per day. Breakfast is oatmeal/cereal/eggs and toast.  Has half sandwich or soup at lunch, avoiding rice and carbs usually   Dinner at 5-6 pm  Dietician visit, most recent: at diagnosis               Weight history:   Wt Readings from Last 3 Encounters:  11/09/21 151 lb 12.8 oz (68.9 kg)  11/01/21 149 lb (67.6 kg)  08/22/21 154 lb (69.9 kg)    Glycemic control:   Lab Results  Component Value Date   HGBA1C 8.1 (H) 02/08/2022   HGBA1C 8.0 (H) 11/07/2021   HGBA1C 7.1 (H) 06/14/2021   Lab Results  Component Value Date   MICROALBUR <0.7 11/07/2021   LDLCALC 63 11/07/2021   CREATININE 1.28 02/08/2022   Lab on 02/08/2022  Component Date Value Ref Range Status   Sodium 02/08/2022 138  135 -  145 mEq/L Final   Potassium 02/08/2022 4.6  3.5 - 5.1 mEq/L Final   Chloride 02/08/2022 101  96 - 112 mEq/L Final   CO2 02/08/2022 32  19 - 32 mEq/L Final   Glucose, Bld 02/08/2022 131 (H)  70 - 99 mg/dL Final   BUN 02/08/2022 20  6 - 23 mg/dL Final   Creatinine, Ser 02/08/2022 1.28  0.40 - 1.50 mg/dL Final   GFR 02/08/2022 56.91 (L)  >60.00 mL/min Final   Calculated using the CKD-EPI Creatinine Equation (2021)   Calcium 02/08/2022 9.9  8.4 - 10.5 mg/dL Final   Hgb A1c MFr Bld 02/08/2022 8.1 (H)  4.6 - 6.5 % Final   Glycemic Control Guidelines for People with Diabetes:Non Diabetic:  <6%Goal of Therapy: <7%Additional Action Suggested:  >8%    TSH 02/08/2022 2.13  0.35 - 5.50 uIU/mL Final     Allergies as of 02/13/2022   No Known Allergies      Medication List        Accurate as of February 13, 2022  1:47 PM. If you have any questions, ask your nurse  or doctor.          alfuzosin 10 MG 24 hr tablet Commonly known as: UROXATRAL Take 10 mg by mouth daily with breakfast.   aspirin 81 MG tablet Take 81 mg by mouth daily.   atorvastatin 20 MG tablet Commonly known as: LIPITOR TAKE 1 TABLET BY MOUTH EVERY DAY *KEEP APPOINTMENT FOR FUTURE REFILLS*   dutasteride 0.5 MG capsule Commonly known as: AVODART Take 0.5 mg by mouth daily.   glipiZIDE 2.5 MG 24 hr tablet Commonly known as: GLUCOTROL XL TAKE 1 TABLET BY MOUTH EVERY DAY WITH BREAKFAST   levothyroxine 100 MCG tablet Commonly known as: SYNTHROID Take 1 tablet (100 mcg total) by mouth daily.   losartan 100 MG tablet Commonly known as: COZAAR TAKE 1 TABLET BY MOUTH ONCE DAILY   meloxicam 15 MG tablet Commonly known as: MOBIC Take 1 tablet (15 mg total) by mouth daily.   metFORMIN 1000 MG tablet Commonly known as: GLUCOPHAGE Take 1 tablet (1,000 mg total) by mouth 2 (two) times daily with a meal.   onetouch ultrasoft lancets Use as instructed.Check sugar 3-4 times daily. E11.65   OneTouch Verio test strip Generic drug: glucose blood TEST 3 TO 4 TIMES DAILY AS DIRECTED   OneTouch Verio w/Device Kit Check sugar 3-4 times daily. E11.65   pioglitazone 30 MG tablet Commonly known as: ACTOS TAKE 1 TABLET BY MOUTH EVERY DAY   tirzepatide 2.5 MG/0.5ML Pen Commonly known as: MOUNJARO Inject 2.5 mg into the skin once a week.   Trulicity 3 IN/8.6VE Sopn Generic drug: Dulaglutide Inject 3 mg as directed once a week.   valACYclovir 500 MG tablet Commonly known as: VALTREX Take 1 tablet (500 mg total) by mouth daily.        Allergies: No Known Allergies  Past Medical History:  Diagnosis Date   ARTHRITIS, HIP 08/10/2008   BURSITIS, RIGHT SHOULDER 05/20/2008   Degeneration of lumbar or lumbosacral intervertebral disc 11/27/2006   DEGENERATION, CERVICAL DISC 11/27/2006   Degenerative disc disease, lumbar    Depression    no per pt 03-30-19   DEPRESSION  11/25/2006   Diabetes mellitus    DIABETES MELLITUS, TYPE II 04/10/2007   Epicondylitis    bilateral   Fatigue    HERPES GENITALIS 09/17/2007   Hyperlipemia    HYPERLIPIDEMIA 03/31/2007   Hypertension    HYPERTENSION 11/25/2006  HYPOTHYROIDISM 10/28/2007   Impaired fasting glucose 12/26/2006   Lateral epicondylitis  of elbow 05/05/2007   Sciatica 10/28/2007   SHOULDER PAIN, RIGHT 08/18/2008    Past Surgical History:  Procedure Laterality Date   ACHILLES TENDON REPAIR     left   COLONOSCOPY     ELBOW SURGERY  2005   left   herniated lumbar disc  1997, 2011   KNEE ARTHROSCOPY  2007   left   Ricardo [name]   right    Family History  Problem Relation Age of Onset   Diabetes Mother    Heart disease Mother    Heart disease Father    Diabetes Sister    Colon cancer Neg Hx    Esophageal cancer Neg Hx    Rectal cancer Neg Hx    Stomach cancer Neg Hx     Social History:  reports that he has never smoked. He has never used smokeless tobacco. He reports current alcohol use of about 3.0 standard drinks of alcohol per week. He reports that he does not use drugs.    Review of Systems         Lipids: He has been treated with Lipitor 20 mg  LDL is much better with taking Lipitor regularly       Lab Results  Component Value Date   CHOL 136 11/07/2021   CHOL 194 06/14/2021   CHOL 184 03/21/2021   Lab Results  Component Value Date   HDL 63.30 11/07/2021   HDL 56.70 06/14/2021   HDL 52.60 03/21/2021   Lab Results  Component Value Date   LDLCALC 63 11/07/2021   LDLCALC 123 (H) 06/14/2021   LDLCALC 115 (H) 03/21/2021   Lab Results  Component Value Date   TRIG 53.0 11/07/2021   TRIG 72.0 06/14/2021   TRIG 79.0 03/21/2021   Lab Results  Component Value Date   CHOLHDL 2 11/07/2021   CHOLHDL 3 06/14/2021   CHOLHDL 3 03/21/2021   Lab Results  Component Value Date   LDLDIRECT 79.0 07/23/2018   LDLDIRECT 126.0 05/12/2014   LDLDIRECT 138.1 12/23/2012                     Thyroid: He has been on Synthroid for several years years, initially had symptoms of fatigue at diagnosis He is on 100 mcg levothyroxine   He has taken this more consistently before eating in the morning  TSH consistently normal  Lab Results  Component Value Date   FREET4 0.85 03/21/2021   TSH 2.13 02/08/2022   TSH 2.88 06/14/2021   TSH 4.68 03/21/2021     Followed by pulmonologist for pulmonary fibrosis  HYPERTENSION: Blood pressure is controlled on Losartan, he however thinks he has significant whitecoat syndrome Not monitoring at home  BP Readings from Last 3 Encounters:  02/13/22 (!) 142/78  11/09/21 122/78  11/01/21 112/68     LABS:  Lab on 02/08/2022  Component Date Value Ref Range Status   Sodium 02/08/2022 138  135 - 145 mEq/L Final   Potassium 02/08/2022 4.6  3.5 - 5.1 mEq/L Final   Chloride 02/08/2022 101  96 - 112 mEq/L Final   CO2 02/08/2022 32  19 - 32 mEq/L Final   Glucose, Bld 02/08/2022 131 (H)  70 - 99 mg/dL Final   BUN 02/08/2022 20  6 - 23 mg/dL Final   Creatinine, Ser 02/08/2022 1.28  0.40 - 1.50 mg/dL Final   GFR 02/08/2022 56.91 (L)  >  60.00 mL/min Final   Calculated using the CKD-EPI Creatinine Equation (2021)   Calcium 02/08/2022 9.9  8.4 - 10.5 mg/dL Final   Hgb A1c MFr Bld 02/08/2022 8.1 (H)  4.6 - 6.5 % Final   Glycemic Control Guidelines for People with Diabetes:Non Diabetic:  <6%Goal of Therapy: <7%Additional Action Suggested:  >8%    TSH 02/08/2022 2.13  0.35 - 5.50 uIU/mL Final       Physical Examination:  BP (!) 142/78   Pulse 72   Ht _0  (1.676 m)   SpO2 97%   BMI 24.50 kg/m              ASSESSMENT:  Diabetes type 2, nonobese.   See history of present illness for detailed discussion of his current management, blood sugar patterns and problems identified  A1c has gone back up to 8%   He is on a regimen with Actos, 2.5 mg glipizide ER and metformin  Blood sugars are now higher with stopping Trulicity  and irregular use of medications as well as inconsistent diet and exercise regimen Blood sugars are as high as 263 at home He could not tolerate 4.5 mg dose of Trulicity  Urine microalbumin normal  LIPIDS: Now well controlled with LDL 63 and better compliance with Lipitor  PLAN:   He can use a 2.5 mg dose for the first 4 weeks and then 5 mg weekly Will do prior to transition if needed Discussed that he can try to start a exercise program with anything that he can tolerate and continue regularly Consistent diet with cutting back on meals and snacks Call if having any side effect If he is not able to tolerate Mounjaro if he has no coverage for this will go back to Trulicity and lower doses   Follow-up in 3 months   There are no Patient Instructions on file for this visit.  Elayne Snare 02/13/2022, 1:47 PM   Note: This office note was prepared with Dragon voice recognition system technology. Any transcriptional errors that result from this process are unintentional.

## 2022-02-13 NOTE — Patient Instructions (Addendum)
Jardiance instead of Actos  Check BP  Losartan 1/2 with this  Swimming ?

## 2022-03-19 ENCOUNTER — Other Ambulatory Visit: Payer: Self-pay | Admitting: Endocrinology

## 2022-03-19 DIAGNOSIS — E1165 Type 2 diabetes mellitus with hyperglycemia: Secondary | ICD-10-CM

## 2022-03-20 MED ORDER — TRULICITY 3 MG/0.5ML ~~LOC~~ SOAJ: 3.0000 mg | SUBCUTANEOUS | 1 refills | Status: DC

## 2022-03-30 ENCOUNTER — Encounter: Payer: Self-pay | Admitting: Endocrinology

## 2022-03-30 ENCOUNTER — Encounter: Payer: Self-pay | Admitting: Internal Medicine

## 2022-03-30 LAB — HM DIABETES EYE EXAM

## 2022-04-10 ENCOUNTER — Encounter: Payer: Self-pay | Admitting: Gastroenterology

## 2022-04-27 ENCOUNTER — Other Ambulatory Visit (INDEPENDENT_AMBULATORY_CARE_PROVIDER_SITE_OTHER): Payer: 59

## 2022-04-27 DIAGNOSIS — E782 Mixed hyperlipidemia: Secondary | ICD-10-CM

## 2022-04-27 DIAGNOSIS — E1165 Type 2 diabetes mellitus with hyperglycemia: Secondary | ICD-10-CM

## 2022-04-27 LAB — COMPREHENSIVE METABOLIC PANEL
ALT: 18 U/L (ref 0–53)
AST: 16 U/L (ref 0–37)
Albumin: 4.3 g/dL (ref 3.5–5.2)
Alkaline Phosphatase: 36 U/L — ABNORMAL LOW (ref 39–117)
BUN: 15 mg/dL (ref 6–23)
CO2: 32 mEq/L (ref 19–32)
Calcium: 9.9 mg/dL (ref 8.4–10.5)
Chloride: 99 mEq/L (ref 96–112)
Creatinine, Ser: 1.21 mg/dL (ref 0.40–1.50)
GFR: 60.8 mL/min (ref >60.00)
Glucose, Bld: 173 mg/dL — ABNORMAL HIGH (ref 70–99)
Potassium: 4.6 mEq/L (ref 3.5–5.1)
Sodium: 137 mEq/L (ref 135–145)
Total Bilirubin: 0.7 mg/dL (ref 0.2–1.2)
Total Protein: 7.1 g/dL (ref 6.0–8.3)

## 2022-04-27 LAB — HEMOGLOBIN A1C: Hgb A1c MFr Bld: 7.6 % — ABNORMAL HIGH (ref 4.6–6.5)

## 2022-04-27 LAB — LDL CHOLESTEROL, DIRECT: Direct LDL: 74 mg/dL

## 2022-05-01 ENCOUNTER — Ambulatory Visit (INDEPENDENT_AMBULATORY_CARE_PROVIDER_SITE_OTHER): Payer: 59 | Admitting: Endocrinology

## 2022-05-01 ENCOUNTER — Encounter: Payer: Self-pay | Admitting: Endocrinology

## 2022-05-01 VITALS — BP 126/70 | HR 73 | Ht 66.0 in | Wt 149.0 lb

## 2022-05-01 DIAGNOSIS — E782 Mixed hyperlipidemia: Secondary | ICD-10-CM | POA: Diagnosis not present

## 2022-05-01 DIAGNOSIS — E1165 Type 2 diabetes mellitus with hyperglycemia: Secondary | ICD-10-CM | POA: Diagnosis not present

## 2022-05-01 NOTE — Progress Notes (Unsigned)
Patient ID: Ricardo [name], male   DOB: March 05, 1950, 72 y.o.   MRN: [id number]           Reason for Appointment: Follow-up    Referring physician:  [name]   History of Present Illness:          Diagnosis: Type 2 diabetes mellitus, date of diagnosis: 2009       Past history: At diagnosis he was having symptoms of fatigue.  He was started on glipizide initially and subsequently metformin was added.  Further details are not available However he has been somewhat irregular with his follow-up and did not have an A1c with his PCP between 8/13 and 11/15 He was referred here because of rising A1c of 7.5 He had been started on Trulicity in [id number] because of gradually increasing A1c He stopped taking Trulicity in [id number] because of out-of-pocket expense  Recent history:     Oral hypoglycemic drugs : Trulicity 3 mg weekly, glipizide ER 2.5 mg, metformin 1 g twice a day,   His A1c is 7.6 about the same on the last visit when it was 8.1  His previously A1c has been as low as 6.8 and highest 9.1  Current blood sugar patterns, problems identified and management: He has not had his Trulicity consistently for up to 4 weeks because of lack of availability at the pharmacy again He could not get Mounjaro approved through his insurance Actos was changed to [place] on the last visit and he has no side effects with this He has however checked his blood sugars more consistently  No consistent pattern of his blood sugars but may be relatively higher between breakfast and lunch and possibly after dinner when he is not checking enough However some of his readings after dinner are fairly good when checked Not able to exercise, has issues with knee pain, does have a gym set up at home Weight may have gone down slightly Lab fasting glucose 173  Side effects from medications have been: Nausea from 4.5 mg Trulicity   Glucose monitoring:  done usually one-2 time a day         Glucometer: One Touch  Verio  Blood Glucose readings from download   PRE-MEAL Fasting Lunch Dinner Bedtime Overall  Glucose range: 128-243    80-243  Mean/median: 170 134 153  156   POST-MEAL PC Breakfast PC Lunch PC Dinner  Glucose range:   121-141  Mean/median:  161    Prior  PRE-MEAL Fasting Lunch Dinner Bedtime Overall  Glucose range: 86-173 101-234   82-234  Mean/median: 132  166   138   POST-MEAL PC Breakfast PC Lunch PC Dinner  Glucose range:   94-182  Mean/median:   135    Self-care:  Meals: 3 meals per day. Breakfast is oatmeal/cereal/eggs and toast.  Has half sandwich or soup at lunch, avoiding rice and carbs usually   Dinner at 5-6 pm  Dietician visit, most recent: at diagnosis               Weight history:   Wt Readings from Last 3 Encounters:  05/01/22 149 lb (67.6 kg)  11/09/21 151 lb 12.8 oz (68.9 kg)  11/01/21 149 lb (67.6 kg)    Glycemic control:   Lab Results  Component Value Date   HGBA1C 7.6 (H) 04/27/2022   HGBA1C 8.1 (H) 02/08/2022   HGBA1C 8.0 (H) 11/07/2021   Lab Results  Component Value Date   MICROALBUR <0.7 11/07/2021   [place] 63  11/07/2021   CREATININE 1.21 04/27/2022   Lab on 04/27/2022  Component Date Value Ref Range Status   Direct LDL 04/27/2022 74.0  mg/dL Final   Optimal:  <100 mg/dLNear or Above Optimal:  100-129 mg/dLBorderline High:  130-159 mg/dLHigh:  160-189 mg/dLVery High:  >190 mg/dL   Sodium 04/27/2022 137  135 - 145 mEq/L Final   Potassium 04/27/2022 4.6  3.5 - 5.1 mEq/L Final   Chloride 04/27/2022 99  96 - 112 mEq/L Final   CO2 04/27/2022 32  19 - 32 mEq/L Final   Glucose, Bld 04/27/2022 173 (H)  70 - 99 mg/dL Final   BUN 04/27/2022 15  6 - 23 mg/dL Final   Creatinine, Ser 04/27/2022 1.21  0.40 - 1.50 mg/dL Final   Total Bilirubin 04/27/2022 0.7  0.2 - 1.2 mg/dL Final   Alkaline Phosphatase 04/27/2022 36 (L)  39 - 117 U/L Final   AST 04/27/2022 16  0 - 37 U/L Final   ALT 04/27/2022 18  0 - 53 U/L Final   Total Protein  04/27/2022 7.1  6.0 - 8.3 g/dL Final   Albumin 04/27/2022 4.3  3.5 - 5.2 g/dL Final   GFR 04/27/2022 60.80  >60.00 mL/min Final   Calculated using the CKD-EPI Creatinine Equation (2021)   Calcium 04/27/2022 9.9  8.4 - 10.5 mg/dL Final   Hgb A1c MFr Bld 04/27/2022 7.6 (H)  4.6 - 6.5 % Final   Glycemic Control Guidelines for People with Diabetes:Non Diabetic:  <6%Goal of Therapy: <7%Additional Action Suggested:  >8%      Allergies as of 05/01/2022   No Known Allergies      Medication List        Accurate as of May 01, 2022 11:59 PM. If you have any questions, ask your nurse or doctor.          STOP taking these medications    losartan 100 MG tablet Commonly known as: COZAAR Stopped by: Elayne Snare, MD   meloxicam 15 MG tablet Commonly known as: MOBIC Stopped by: Elayne Snare, MD   pioglitazone 30 MG tablet Commonly known as: ACTOS Stopped by: Elayne Snare, MD   tirzepatide 2.5 MG/0.5ML Pen Commonly known as: MOUNJARO Stopped by: Elayne Snare, MD       TAKE these medications    alfuzosin 10 MG 24 hr tablet Commonly known as: UROXATRAL Take 10 mg by mouth daily with breakfast.   aspirin 81 MG tablet Take 81 mg by mouth daily.   atorvastatin 20 MG tablet Commonly known as: LIPITOR TAKE 1 TABLET BY MOUTH EVERY DAY *KEEP APPOINTMENT FOR FUTURE REFILLS*   dutasteride 0.5 MG capsule Commonly known as: AVODART Take 0.5 mg by mouth daily.   empagliflozin 10 MG Tabs tablet Commonly known as: Jardiance Take 1 tablet (10 mg total) by mouth daily with breakfast.   glipiZIDE 2.5 MG 24 hr tablet Commonly known as: GLUCOTROL XL TAKE 1 TABLET BY MOUTH EVERY DAY WITH BREAKFAST   levothyroxine 100 MCG tablet Commonly known as: SYNTHROID Take 1 tablet (100 mcg total) by mouth daily.   metFORMIN 1000 MG tablet Commonly known as: GLUCOPHAGE Take 1 tablet (1,000 mg total) by mouth 2 (two) times daily with a meal.   onetouch ultrasoft lancets Use as instructed.Check  sugar 3-4 times daily. E11.65   OneTouch Verio test strip Generic drug: glucose blood TEST 3 TO 4 TIMES DAILY AS DIRECTED   OneTouch Verio w/Device Kit Check sugar 3-4 times daily. 123456   Trulicity 3  MG/0.5ML Sopn Generic drug: Dulaglutide Inject 3 mg as directed once a week.   valACYclovir 500 MG tablet Commonly known as: VALTREX Take 1 tablet (500 mg total) by mouth daily.        Allergies: No Known Allergies  Past Medical History:  Diagnosis Date   ARTHRITIS, HIP 08/10/2008   BURSITIS, RIGHT SHOULDER 05/20/2008   Degeneration of lumbar or lumbosacral intervertebral disc 11/27/2006   DEGENERATION, CERVICAL DISC 11/27/2006   Degenerative disc disease, lumbar    Depression    no per pt 03-30-19   DEPRESSION 11/25/2006   Diabetes mellitus    DIABETES MELLITUS, TYPE II 04/10/2007   Epicondylitis    bilateral   Fatigue    HERPES GENITALIS 09/17/2007   Hyperlipemia    HYPERLIPIDEMIA 03/31/2007   Hypertension    HYPERTENSION 11/25/2006   HYPOTHYROIDISM 10/28/2007   Impaired fasting glucose 12/26/2006   Lateral epicondylitis  of elbow 05/05/2007   Sciatica 10/28/2007   SHOULDER PAIN, RIGHT 08/18/2008    Past Surgical History:  Procedure Laterality Date   ACHILLES TENDON REPAIR     left   COLONOSCOPY     ELBOW SURGERY  2005   left   herniated lumbar disc  1997, 2011   KNEE ARTHROSCOPY  2007   left   [place]   right    Family History  Problem Relation Age of Onset   Diabetes Mother    Heart disease Mother    Heart disease Father    Diabetes Sister    Colon cancer Neg Hx    Esophageal cancer Neg Hx    Rectal cancer Neg Hx    Stomach cancer Neg Hx     Social History:  reports that he has never smoked. He has never used smokeless tobacco. He reports current alcohol use of about 3.0 standard drinks of alcohol per week. He reports that he does not use drugs.    Review of Systems         Lipids: He has been treated with Lipitor 20 mg  LDL is  controlled better with taking Lipitor regularly: LDL 74       Lab Results  Component Value Date   CHOL 136 11/07/2021   CHOL 194 06/14/2021   CHOL 184 03/21/2021   Lab Results  Component Value Date   HDL 63.30 11/07/2021   HDL 56.70 06/14/2021   HDL 52.60 03/21/2021   Lab Results  Component Value Date   LDLCALC 63 11/07/2021   LDLCALC 123 (H) 06/14/2021   LDLCALC 115 (H) 03/21/2021   Lab Results  Component Value Date   TRIG 53.0 11/07/2021   TRIG 72.0 06/14/2021   TRIG 79.0 03/21/2021   Lab Results  Component Value Date   CHOLHDL 2 11/07/2021   CHOLHDL 3 06/14/2021   CHOLHDL 3 03/21/2021   Lab Results  Component Value Date   LDLDIRECT 74.0 04/27/2022   LDLDIRECT 79.0 07/23/2018   LDLDIRECT 126.0 05/12/2014                   Thyroid: He has been on Synthroid for several years years, initially had symptoms of fatigue at diagnosis He is on 100 mcg levothyroxine  Also followed by PCP  TSH consistently normal  Lab Results  Component Value Date   FREET4 0.85 03/21/2021   TSH 2.13 02/08/2022   TSH 2.88 06/14/2021   TSH 4.68 03/21/2021     Followed by pulmonologist for pulmonary fibrosis  HYPERTENSION:  Blood pressure is controlled  He says he has not taken losartan for a year and his PCP felt he had whitecoat syndrome  BP Readings from Last 3 Encounters:  05/01/22 126/70  02/13/22 (!) 142/78  11/09/21 122/78     LABS:  Lab on 04/27/2022  Component Date Value Ref Range Status   Direct LDL 04/27/2022 74.0  mg/dL Final   Optimal:  <100 mg/dLNear or Above Optimal:  100-129 mg/dLBorderline High:  130-159 mg/dLHigh:  160-189 mg/dLVery High:  >190 mg/dL   Sodium 04/27/2022 137  135 - 145 mEq/L Final   Potassium 04/27/2022 4.6  3.5 - 5.1 mEq/L Final   Chloride 04/27/2022 99  96 - 112 mEq/L Final   CO2 04/27/2022 32  19 - 32 mEq/L Final   Glucose, Bld 04/27/2022 173 (H)  70 - 99 mg/dL Final   BUN 04/27/2022 15  6 - 23 mg/dL Final   Creatinine, Ser  04/27/2022 1.21  0.40 - 1.50 mg/dL Final   Total Bilirubin 04/27/2022 0.7  0.2 - 1.2 mg/dL Final   Alkaline Phosphatase 04/27/2022 36 (L)  39 - 117 U/L Final   AST 04/27/2022 16  0 - 37 U/L Final   ALT 04/27/2022 18  0 - 53 U/L Final   Total Protein 04/27/2022 7.1  6.0 - 8.3 g/dL Final   Albumin 04/27/2022 4.3  3.5 - 5.2 g/dL Final   GFR 04/27/2022 60.80  >60.00 mL/min Final   Calculated using the CKD-EPI Creatinine Equation (2021)   Calcium 04/27/2022 9.9  8.4 - 10.5 mg/dL Final   Hgb A1c MFr Bld 04/27/2022 7.6 (H)  4.6 - 6.5 % Final   Glycemic Control Guidelines for People with Diabetes:Non Diabetic:  <6%Goal of Therapy: <7%Additional Action Suggested:  >8%        Physical Examination:  BP 126/70 (BP Location: Left Arm, Patient Position: Sitting, Cuff Size: Small)   Pulse 73   Ht '5\' 6"'$  (1.676 m)   Wt 149 lb (67.6 kg)   SpO2 96%   BMI 24.05 kg/m              ASSESSMENT:  Diabetes type 2, nonobese.   See history of present illness for detailed discussion of his current management, blood sugar patterns and problems identified  A1c has stayed around 8% but now 7.6   He is on a regimen with Trulicity, 2.5 mg glipizide ER, Jardiance 10 mg and metformin  He may be benefiting somewhat from using Jardiance Blood sugars are not consistently high at home fasting However may have higher readings partly related to not getting his Trulicity Currently with glipizide he is tends to have higher readings in the mornings compared to the afternoons and early evenings but likely not taking enough readings after dinner  Renal function stable on Jardiance   Lipids: LDL 74 on Lipitor which she will continue regularly  Hypothyroidism: Adequately replaced  PLAN:   He will continue Jardiance 10 mg for now However we will switch him from glipizide to Amaryl at dinnertime and start with 1 mg Hopefully he can start exercising including resistance exercises or water walking If A1c is not  further improved may increase Jardiance on the next visit Discussed timing of glucose monitoring and blood sugar targets  He will come back for follow-up when he returns back to town   There are no Patient Instructions on file for this visit.  Elayne Snare 05/02/2022, 3:35 PM   Note: This office note was prepared with Viviann Spare  voice recognition system technology. Any transcriptional errors that result from this process are unintentional.

## 2022-05-02 MED ORDER — GLIMEPIRIDE 1 MG PO TABS: 1.0000 mg | ORAL_TABLET | Freq: Every day | ORAL | 1 refills | Status: DC

## 2022-06-13 NOTE — Progress Notes (Unsigned)
[name] [address] [place] [address] Phone: [phone] Subjective:   INadine [name], am serving as a scribe for Dr. Antoine Primas.  I'm seeing this patient by the request  of:  [name], Georgina Quint, MD  CC: Right knee pain  [id number]  Ricardo [name] is a 72 y.o. male coming in with complaint of R knee pain. Saw Dr. Denyse Amass in Sept 2023. Patient states last couple of months has been hurt. Feels like bone on bone pain. Isn't able to walk when he has the pain. Has done no at home treatment.      Past Medical History:  Diagnosis Date   ARTHRITIS, HIP 08/10/2008   BURSITIS, RIGHT SHOULDER 05/20/2008   Degeneration of lumbar or lumbosacral intervertebral disc 11/27/2006   DEGENERATION, CERVICAL DISC 11/27/2006   Degenerative disc disease, lumbar    Depression    no per pt 03-30-19   DEPRESSION 11/25/2006   Diabetes mellitus    DIABETES MELLITUS, TYPE II 04/10/2007   Epicondylitis    bilateral   Fatigue    HERPES GENITALIS 09/17/2007   Hyperlipemia    HYPERLIPIDEMIA 03/31/2007   Hypertension    HYPERTENSION 11/25/2006   HYPOTHYROIDISM 10/28/2007   Impaired fasting glucose 12/26/2006   Lateral epicondylitis  of elbow 05/05/2007   Sciatica 10/28/2007   SHOULDER PAIN, RIGHT 08/18/2008   Past Surgical History:  Procedure Laterality Date   ACHILLES TENDON REPAIR     left   COLONOSCOPY     ELBOW SURGERY  2005   left   herniated lumbar disc  1997, 2011   KNEE ARTHROSCOPY  2007   left   ROTATOR CUFF REPAIR  1997   right   Social History   Socioeconomic History   Marital status: Married    Spouse name: Not on file   Number of children: Not on file   Years of education: Not on file   Highest education level: Not on file  Occupational History   Not on file  Tobacco Use   Smoking status: Never   Smokeless tobacco: Never  Vaping Use   Vaping Use: Never used  Substance and Sexual Activity   Alcohol use: Yes    Alcohol/week: 3.0  standard drinks of alcohol    Types: 3 Cans of beer per week    Comment: occ    Drug use: No   Sexual activity: Yes  Other Topics Concern   Not on file  Social History Narrative   Not on file   Social Determinants of Health   Financial Resource Strain: Not on file  Food Insecurity: Not on file  Transportation Needs: Not on file  Physical Activity: Not on file  Stress: Not on file  Social Connections: Not on file   No Known Allergies Family History  Problem Relation Age of Onset   Diabetes Mother    Heart disease Mother    Heart disease Father    Diabetes Sister    Colon cancer Neg Hx    Esophageal cancer Neg Hx    Rectal cancer Neg Hx    Stomach cancer Neg Hx     Current Outpatient Medications (Endocrine & Metabolic):    Dulaglutide (TRULICITY) 3 MG/0.5ML SOPN, Inject 3 mg as directed once a week.   empagliflozin (JARDIANCE) 10 MG TABS tablet, Take 1 tablet (10 mg total) by mouth daily with breakfast.   glimepiride (AMARYL) 1 MG tablet, Take 1 tablet (1 mg total) by mouth  daily after supper.   levothyroxine (SYNTHROID) 100 MCG tablet, Take 1 tablet (100 mcg total) by mouth daily.   metFORMIN (GLUCOPHAGE) 1000 MG tablet, Take 1 tablet (1,000 mg total) by mouth 2 (two) times daily with a meal.  Current Outpatient Medications (Cardiovascular):    atorvastatin (LIPITOR) 20 MG tablet, TAKE 1 TABLET BY MOUTH EVERY DAY *KEEP APPOINTMENT FOR FUTURE REFILLS*   Current Outpatient Medications (Analgesics):    aspirin 81 MG tablet, Take 81 mg by mouth daily.   Current Outpatient Medications (Other):    alfuzosin (UROXATRAL) 10 MG 24 hr tablet, Take 10 mg by mouth daily with breakfast.   Blood Glucose Monitoring Suppl (ONETOUCH VERIO) w/Device KIT, Check sugar 3-4 times daily. E11.65   dutasteride (AVODART) 0.5 MG capsule, Take 0.5 mg by mouth daily.   Lancets (ONETOUCH ULTRASOFT) lancets, Use as instructed.Check sugar 3-4 times daily. E11.65   ONETOUCH VERIO test strip, TEST 3  TO 4 TIMES DAILY AS DIRECTED   valACYclovir (VALTREX) 500 MG tablet, Take 1 tablet (500 mg total) by mouth daily.   Reviewed prior external information including notes and imaging from  primary care provider As well as notes that were available from care everywhere and other healthcare systems.  Past medical history, social, surgical and family history all reviewed in electronic medical record.  No pertanent information unless stated regarding to the chief complaint.   Review of Systems:  No headache, visual changes, nausea, vomiting, diarrhea, constipation, dizziness, abdominal pain, skin rash, fevers, chills, night sweats, weight loss, swollen lymph nodes, body aches, joint swelling, chest pain, shortness of breath, mood changes. POSITIVE muscle aches  Objective  Blood pressure 130/82, pulse 69, height  (1.676 m), weight 147 lb (66.7 kg), SpO2 98 %.   General: No apparent distress alert and oriented x3 mood and affect normal, dressed appropriately.  HEENT: Pupils equal, extraocular movements intact  Respiratory: Patient's speak in full sentences and does not appear short of breath  Cardiovascular: No lower extremity edema, non tender, no erythema  Severely antalgic gait noted.  Patient does have a trace effusion noted of the right side of the knee.  Severe tenderness over the medial joint line.  Positive McMurray's noted.  Limited muscular skeletal ultrasound was performed and interpreted by Antoine Primas, M  Limited ultrasound shows a severe synovitis noted at the patellofemoral joint.  Hypoechoic changes noted as well.  Patient also has what appears to be an acute on chronic meniscal tear with 25% displacement noted.  Moderate narrowing of the medial and patellofemoral joint space. Impression: Acute on chronic meniscal tear and underlying arthritis  After informed written and verbal consent, patient was seated on exam table. Right knee was prepped with alcohol swab and utilizing  anterolateral approach, patient's right knee space was injected with 4:1  marcaine 0.5%: Kenalog /dL. Patient tolerated the procedure well without immediate complications.    Impression and Recommendations:

## 2022-06-14 ENCOUNTER — Other Ambulatory Visit: Payer: Self-pay

## 2022-06-14 ENCOUNTER — Ambulatory Visit (INDEPENDENT_AMBULATORY_CARE_PROVIDER_SITE_OTHER): Payer: 59 | Admitting: Family Medicine

## 2022-06-14 ENCOUNTER — Encounter: Payer: Self-pay | Admitting: Family Medicine

## 2022-06-14 VITALS — BP 130/82 | HR 69 | Ht 66.0 in | Wt 147.0 lb

## 2022-06-14 DIAGNOSIS — M25561 Pain in right knee: Secondary | ICD-10-CM

## 2022-06-14 DIAGNOSIS — S83241A Other tear of medial meniscus, current injury, right knee, initial encounter: Secondary | ICD-10-CM

## 2022-06-14 DIAGNOSIS — S83206A Unspecified tear of unspecified meniscus, current injury, right knee, initial encounter: Secondary | ICD-10-CM | POA: Insufficient documentation

## 2022-06-14 NOTE — Patient Instructions (Addendum)
Injection in knee Do prescribed exercises at least 3x a week See you again in 5-6 weeks If worsen pain in 2-3 weeks let's get an MRI Vitamin D3 2000IU daily  Turmeric  daily  Tart cherry 

## 2022-06-14 NOTE — Assessment & Plan Note (Signed)
Patient has what appears to be acute on chronic tear noted.  Discussed with patient about icing regimen and home exercises, we discussed with patient that I do think that there is fairly significant underlying arthritis that is also the concern.  We discussed that with patient having this intermittent severe pain and instability that we do need to consider the possibility of advanced imaging if conservative therapy does not seem to respond.  Has done formal physical therapy previously as well.  Follow-up with me again in 6 weeks.  Will get approval for viscosupplementation for some of the patellofemoral arthritis also noted.

## 2022-07-03 DIAGNOSIS — N529 Male erectile dysfunction, unspecified: Secondary | ICD-10-CM | POA: Insufficient documentation

## 2022-07-09 ENCOUNTER — Other Ambulatory Visit: Payer: Self-pay | Admitting: Internal Medicine

## 2022-07-31 ENCOUNTER — Other Ambulatory Visit: Payer: Self-pay | Admitting: Internal Medicine

## 2022-07-31 ENCOUNTER — Other Ambulatory Visit: Payer: Self-pay | Admitting: Endocrinology

## 2022-08-01 NOTE — Progress Notes (Unsigned)
[name] [address] [place] [address] Phone: [phone] Subjective:   Ricardo [name], am serving as a scribe for Dr. Antoine Primas.  I'm seeing this patient by the request  of:  [name], Georgina Quint, MD  CC: Right knee pain  [id number]  06/14/2022 Patient has what appears to be acute on chronic tear noted.  Discussed with patient about icing regimen and home exercises, we discussed with patient that I do think that there is fairly significant underlying arthritis that is also the concern.  We discussed that with patient having this intermittent severe pain and instability that we do need to consider the possibility of advanced imaging if conservative therapy does not seem to respond.  Has done formal physical therapy previously as well.  Follow-up with me again in 6 weeks.  Will get approval for viscosupplementation for some of the patellofemoral arthritis also noted.      Update 08/02/2022 Ricardo [name] is a 72 y.o. male coming in with complaint of R knee pain. Patient states slowly started to feel the pain come back. Injection worked well.         Reviewed prior external information including notes and imaging from previsou exam, outside providers and external EMR if available.   As well as notes that were available from care everywhere and other healthcare systems.  Past medical history, social, surgical and family history all reviewed in electronic medical record.  No pertanent information unless stated regarding to the chief complaint.   Past Medical History:  Diagnosis Date   ARTHRITIS, HIP 08/10/2008   BURSITIS, RIGHT SHOULDER 05/20/2008   Degeneration of lumbar or lumbosacral intervertebral disc 11/27/2006   DEGENERATION, CERVICAL DISC 11/27/2006   Degenerative disc disease, lumbar    Depression    no per pt 03-30-19   DEPRESSION 11/25/2006   Diabetes mellitus    DIABETES MELLITUS, TYPE II 04/10/2007   Epicondylitis     bilateral   Fatigue    HERPES GENITALIS 09/17/2007   Hyperlipemia    HYPERLIPIDEMIA 03/31/2007   Hypertension    HYPERTENSION 11/25/2006   HYPOTHYROIDISM 10/28/2007   Impaired fasting glucose 12/26/2006   Lateral epicondylitis  of elbow 05/05/2007   Sciatica 10/28/2007   SHOULDER PAIN, RIGHT 08/18/2008    No Known Allergies   Review of Systems:  No headache, visual changes, nausea, vomiting, diarrhea, constipation, dizziness, abdominal pain, skin rash, fevers, chills, night sweats, weight loss, swollen lymph nodes, body aches, joint swelling, chest pain, shortness of breath, mood changes. POSITIVE muscle aches  Objective  Blood pressure 130/80, pulse 64, height 5\' 6"  (1.676 m), weight 143 lb (64.9 kg), SpO2 99 %.   General: No apparent distress alert and oriented x3 mood and affect normal, dressed appropriately.  HEENT: Pupils equal, extraocular movements intact  Respiratory: Patient's speak in full sentences and does not appear short of breath  Cardiovascular: No lower extremity edema, non tender, no erythema  Patient gait is significantly better than previous exam.  Still some mild tenderness over the medial joint line.  Mild crepitus noted but good range of motion.  Limited muscular skeletal ultrasound was performed and interpreted by Antoine Primas, M  Limited ultrasound still shows the tearing noted of the medial meniscus.  Trace effusion noted with hypoechoic changes in the patellofemoral joint.  Otherwise fairly unremarkable. Impression: Interval improvement but continued meniscal tear and arthritic changes     Assessment and Plan:  Right knee meniscal tear Overall seems to  be doing significantly better.  Discussed with patient that I do think that this will continue to improve but I do feel the underlying arthritis will have some exacerbations from time to time.  Follow-up with me again in 3 months       The above documentation has been reviewed and is accurate and complete  Judi Saa, DO         Note: This dictation was prepared with Dragon dictation along with smaller phrase technology. Any transcriptional errors that result from this process are unintentional.

## 2022-08-02 ENCOUNTER — Telehealth: Payer: Self-pay | Admitting: Internal Medicine

## 2022-08-02 ENCOUNTER — Other Ambulatory Visit: Payer: Self-pay

## 2022-08-02 ENCOUNTER — Encounter: Payer: Self-pay | Admitting: Family Medicine

## 2022-08-02 ENCOUNTER — Ambulatory Visit (INDEPENDENT_AMBULATORY_CARE_PROVIDER_SITE_OTHER): Payer: 59 | Admitting: Family Medicine

## 2022-08-02 VITALS — BP 130/80 | HR 64 | Ht 66.0 in | Wt 143.0 lb

## 2022-08-02 DIAGNOSIS — M25561 Pain in right knee: Secondary | ICD-10-CM | POA: Diagnosis not present

## 2022-08-02 DIAGNOSIS — S83241A Other tear of medial meniscus, current injury, right knee, initial encounter: Secondary | ICD-10-CM

## 2022-08-02 NOTE — Assessment & Plan Note (Signed)
Overall seems to be doing significantly better.  Discussed with patient that I do think that this will continue to improve but I do feel the underlying arthritis will have some exacerbations from time to time.  Follow-up with me again in 3 months

## 2022-08-02 NOTE — Patient Instructions (Signed)
See you again in 3-4 months

## 2022-08-02 NOTE — Telephone Encounter (Signed)
Prescription Request  08/02/2022  LOV: 2.16.23  What is the name of the medication or equipment? atorvastatin (LIPITOR) 20 MG tablet   Have you contacted your pharmacy to request a refill? Yes   Which pharmacy would you like this sent to?  [id number] - [place], [place] - [address] AT [address] OF [facility] [address] [place] [address] Phone: [phone] Fax: [phone]   Patient notified that their request is being sent to the clinical staff for review and that they should receive a response within 2 business days.   Please advise at Mobile [phone] (mobile)

## 2022-08-02 NOTE — Telephone Encounter (Signed)
Refill was denied yesterday. Pt is overdue for appt last saw MD 04/13/2021. Must be seen for refill.Marland KitchenRaechel [name]

## 2022-08-11 ENCOUNTER — Other Ambulatory Visit: Payer: Self-pay | Admitting: Internal Medicine

## 2022-08-23 ENCOUNTER — Ambulatory Visit (INDEPENDENT_AMBULATORY_CARE_PROVIDER_SITE_OTHER): Payer: 59 | Admitting: Internal Medicine

## 2022-08-23 ENCOUNTER — Encounter: Payer: Self-pay | Admitting: Internal Medicine

## 2022-08-23 VITALS — BP 118/62 | HR 63 | Temp 98.7°F | Ht 66.0 in | Wt 143.0 lb

## 2022-08-23 DIAGNOSIS — K4091 Unilateral inguinal hernia, without obstruction or gangrene, recurrent: Secondary | ICD-10-CM | POA: Diagnosis not present

## 2022-08-23 DIAGNOSIS — Z0001 Encounter for general adult medical examination with abnormal findings: Secondary | ICD-10-CM | POA: Diagnosis not present

## 2022-08-23 DIAGNOSIS — Z7984 Long term (current) use of oral hypoglycemic drugs: Secondary | ICD-10-CM | POA: Diagnosis not present

## 2022-08-23 DIAGNOSIS — K4090 Unilateral inguinal hernia, without obstruction or gangrene, not specified as recurrent: Secondary | ICD-10-CM | POA: Insufficient documentation

## 2022-08-23 DIAGNOSIS — R972 Elevated prostate specific antigen [PSA]: Secondary | ICD-10-CM

## 2022-08-23 DIAGNOSIS — Z0000 Encounter for general adult medical examination without abnormal findings: Secondary | ICD-10-CM

## 2022-08-23 DIAGNOSIS — E119 Type 2 diabetes mellitus without complications: Secondary | ICD-10-CM | POA: Diagnosis not present

## 2022-08-23 DIAGNOSIS — E034 Atrophy of thyroid (acquired): Secondary | ICD-10-CM

## 2022-08-23 DIAGNOSIS — Z13220 Encounter for screening for lipoid disorders: Secondary | ICD-10-CM | POA: Diagnosis not present

## 2022-08-23 MED ORDER — ATORVASTATIN CALCIUM 20 MG PO TABS: ORAL_TABLET | ORAL | 3 refills | Status: DC

## 2022-08-23 NOTE — Assessment & Plan Note (Signed)
Chronic On Levothroid - 

## 2022-08-23 NOTE — Assessment & Plan Note (Signed)
L inguinal hernia - small, NT Surgery ref Info

## 2022-08-23 NOTE — Progress Notes (Signed)
Subjective:  Patient ID: Ricardo [name], male    DOB: Jan 30, 1951  Age: 72 y.o. MRN: [id number]  CC: Annual Exam   HPI Ricardo [name] presents for a well exam C/o LLQ abd pain episodes - last in early spring - severe, has intermittent tenderness  Outpatient Medications Prior to Visit  Medication Sig Dispense Refill   alfuzosin (UROXATRAL) 10 MG 24 hr tablet Take 10 mg by mouth daily with breakfast.     aspirin 81 MG tablet Take 81 mg by mouth daily.     Blood Glucose Monitoring Suppl (ONETOUCH VERIO) w/Device KIT Check sugar 3-4 times daily. E11.65 1 kit 0   dutasteride (AVODART) 0.5 MG capsule Take 0.5 mg by mouth daily.     JARDIANCE 10 MG TABS tablet TAKE 1 TABLET (10 MG TOTAL) BY MOUTH DAILY WITH BREAKFAST. 90 tablet 1   Lancets (ONETOUCH ULTRASOFT) lancets Use as instructed.Check sugar 3-4 times daily. E11.65 100 each 12   levothyroxine (SYNTHROID) 100 MCG tablet Take 1 tablet (100 mcg total) by mouth daily. 90 tablet 1   metFORMIN (GLUCOPHAGE) 1000 MG tablet Take 1 tablet (1,000 mg total) by mouth 2 (two) times daily with a meal. 180 tablet 3   ONETOUCH VERIO test strip TEST 3 TO 4 TIMES DAILY AS DIRECTED 400 strip 3   valACYclovir (VALTREX) 500 MG tablet Take 1 tablet (500 mg total) by mouth daily. 90 tablet 0   atorvastatin (LIPITOR) 20 MG tablet TAKE 1 TABLET BY MOUTH EVERY DAY *KEEP APPOINTMENT FOR FUTURE REFILLS* 90 tablet 0   Dulaglutide (TRULICITY) 3 MG/0.5ML SOPN Inject 3 mg as directed once a week. 6 mL 1   glimepiride (AMARYL) 1 MG tablet Take 1 tablet (1 mg total) by mouth daily after supper. 90 tablet 1   No facility-administered medications prior to visit.    ROS: Review of Systems  Constitutional:  Negative for appetite change, fatigue and unexpected weight change.  HENT:  Negative for congestion, nosebleeds, sneezing, sore throat and trouble swallowing.   Eyes:  Negative for itching and visual disturbance.  Respiratory:  Negative for cough.    Cardiovascular:  Negative for chest pain, palpitations and leg swelling.  Gastrointestinal:  Positive for abdominal pain. Negative for abdominal distention, blood in stool, diarrhea, nausea and vomiting.  Genitourinary:  Negative for frequency and hematuria.  Musculoskeletal:  Negative for back pain, gait problem, joint swelling and neck pain.  Skin:  Negative for rash.  Neurological:  Negative for dizziness, tremors, speech difficulty and weakness.  Psychiatric/Behavioral:  Negative for agitation, dysphoric mood and sleep disturbance. The patient is not nervous/anxious.     Objective:  BP 118/62 (BP Location: Left Arm, Patient Position: Sitting, Cuff Size: Large)   Pulse 63   Temp 98.7 F (37.1 C) (Oral)   Ht 5\' 6"  (1.676 m)   Wt 143 lb (64.9 kg)   SpO2 97%   BMI 23.08 kg/m   BP Readings from Last 3 Encounters:  08/23/22 118/62  08/02/22 130/80  06/14/22 130/82    Wt Readings from Last 3 Encounters:  08/23/22 143 lb (64.9 kg)  08/02/22 143 lb (64.9 kg)  06/14/22 147 lb (66.7 kg)    Physical Exam Constitutional:      General: He is not in acute distress.    Appearance: Normal appearance. He is well-developed.     Comments: NAD  Eyes:     Conjunctiva/sclera: Conjunctivae normal.     Pupils: Pupils are equal, round, and reactive to  light.  Neck:     Thyroid: No thyromegaly.     Vascular: No JVD.  Cardiovascular:     Rate and Rhythm: Normal rate and regular rhythm.     Heart sounds: Normal heart sounds. No murmur heard.    No friction rub. No gallop.  Pulmonary:     Effort: Pulmonary effort is normal. No respiratory distress.     Breath sounds: Normal breath sounds. No wheezing or rales.  Chest:     Chest wall: No tenderness.  Abdominal:     General: Bowel sounds are normal. There is no distension.     Palpations: Abdomen is soft. There is no mass.     Tenderness: There is no abdominal tenderness. There is no guarding or rebound.     Hernia: A hernia is present.   Musculoskeletal:        General: No tenderness. Normal range of motion.     Cervical back: Normal range of motion.     Right lower leg: No edema.     Left lower leg: No edema.  Lymphadenopathy:     Cervical: No cervical adenopathy.  Skin:    General: Skin is warm and dry.     Findings: No rash.  Neurological:     Mental Status: He is alert and oriented to person, place, and time.     Cranial Nerves: No cranial nerve deficit.     Motor: No abnormal muscle tone.     Coordination: Coordination normal.     Gait: Gait normal.     Deep Tendon Reflexes: Reflexes are normal and symmetric.  Psychiatric:        Behavior: Behavior normal.        Thought Content: Thought content normal.        Judgment: Judgment normal.     Rectal per Dr Logan Bores - recent L inguinal hernia - small, NT  Lab Results  Component Value Date   WBC 6.0 08/24/2022   HGB 14.0 08/24/2022   HCT 43.0 08/24/2022   PLT 195.0 08/24/2022   GLUCOSE 155 (H) 08/24/2022   CHOL 164 08/24/2022   TRIG 67.0 08/24/2022   HDL 59.50 08/24/2022   LDLDIRECT 74.0 04/27/2022   LDLCALC 91 08/24/2022   ALT 11 08/24/2022   AST 12 08/24/2022   NA 137 08/24/2022   K 4.6 08/24/2022   CL 100 08/24/2022   CREATININE 1.12 08/24/2022   BUN 18 08/24/2022   CO2 31 08/24/2022   TSH 3.90 08/24/2022   PSA 1.90 03/12/2019   INR 1.0 04/02/2007   HGBA1C 8.6 (H) 08/24/2022   MICROALBUR <0.7 08/24/2022    CT Chest High Resolution  Result Date: 05/07/2018 CLINICAL DATA:  Shortness of breath on exertion. A bronchitis in January. Persistent cough. EXAM: CT CHEST WITHOUT CONTRAST TECHNIQUE: Multidetector CT imaging of the chest was performed following the standard protocol without intravenous contrast. High resolution imaging of the lungs, as well as inspiratory and expiratory imaging, was performed. COMPARISON:  02/18/2018, 04/01/2007 and 04/24/2006. FINDINGS: Cardiovascular: Atherosclerotic calcification of the aorta. Heart is at the upper  limits of normal in size. No pericardial effusion. Mediastinum/Nodes: No pathologically enlarged mediastinal or axillary lymph nodes. Hilar regions are difficult to evaluate without IV contrast. Esophagus is grossly unremarkable. Lungs/Pleura: Peripheral and basilar subpleural ground-glass and interstitial coarsening with probable mild traction bronchiolectasis. No honeycombing. Minimal subpleural reticular densities. Findings are stable to minimally progressive from 04/01/2007. Mild air trapping. No pleural fluid. Airway is unremarkable. Upper Abdomen: Visualized  portions of the liver, adrenal glands and right kidney are unremarkable. 4.7 cm low-attenuation lesion in the left kidney is incompletely imaged but likely a cyst. Visualized portions of the spleen, pancreas, stomach and bowel are grossly unremarkable. No upper abdominal adenopathy. Musculoskeletal: Degenerative changes in the spine. No worrisome lytic or sclerotic lesions. IMPRESSION: 1. 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 Findings are indeterminate for UIP per consensus guidelines: Diagnosis of Idiopathic Pulmonary Fibrosis: An Official ATS/ERS/JRS/ALAT Clinical Practice Guideline. Am Rosezetta Schlatter Crit Care Med Vol 198, Iss 5, [phone], Oct 27 2016. 2.  Aortic atherosclerosis (ICD10-170.0). Electronically Signed   By: Leanna Battles M.D.   On: 05/07/2018 15:08    Assessment & Plan:   Problem List Items Addressed This Visit     Hypothyroidism    Chronic  On Levothroid      Diabetes type 2, controlled (HCC) - Primary    Cont on Janvia, Metformin, Glipizide, Losartan, Lipitor, ASA      Relevant Medications   atorvastatin (LIPITOR) 20 MG tablet   Dulaglutide (TRULICITY) 4.5 MG/0.5ML SOPN   glimepiride (AMARYL) 1 MG tablet   Other Relevant Orders   Microalbumin / creatinine urine ratio  (Completed)   Hemoglobin A1c (Completed)   Well adult exam   Relevant Orders   TSH (Completed)   CBC with Differential/Platelet (Completed)   Comprehensive metabolic panel (Completed)   Lipid panel (Completed)   Microalbumin / creatinine urine ratio (Completed)   Hemoglobin A1c (Completed)   Elevated prostate specific antigen (PSA)    F/u w/Dr Logan Bores      Inguinal hernia    L inguinal hernia - small, NT Surgery ref Info      Relevant Orders   Ambulatory referral to General Surgery      Meds ordered this encounter  Medications   atorvastatin (LIPITOR) 20 MG tablet    Sig: TAKE 1 TABLET BY MOUTH EVERY DAY    Dispense:  90 tablet    Refill:  3   Dulaglutide (TRULICITY) 4.5 MG/0.5ML SOPN    Sig: Inject 4.5 mg as directed once a week.    Dispense:  2 mL    Refill:  5   glimepiride (AMARYL) 1 MG tablet    Sig: Take 1 tablet (1 mg total) by mouth 2 (two) times daily.    Dispense:  180 tablet    Refill:  1      Follow-up: Return in about 3 months (around 11/23/2022) for a follow-up visit.  Sonda Primes, MD

## 2022-08-23 NOTE — Assessment & Plan Note (Addendum)
Cont on Janvia, Metformin, Glipizide, Losartan, Lipitor, ASA Elevated A1c.  Increase glimepiride to 1 mg twice a day increase Trulicity to 4.5 mg weekly

## 2022-08-23 NOTE — Assessment & Plan Note (Signed)
F/u w/Dr Logan Bores

## 2022-08-24 LAB — HEMOGLOBIN A1C: Hgb A1c MFr Bld: 8.6 % — ABNORMAL HIGH (ref 4.6–6.5)

## 2022-08-24 LAB — CBC WITH DIFFERENTIAL/PLATELET
Basophils Absolute: 0 10*3/uL (ref 0.0–0.1)
Basophils Relative: 0.7 % (ref 0.0–3.0)
Eosinophils Absolute: 0.2 10*3/uL (ref 0.0–0.7)
Eosinophils Relative: 3.1 % (ref 0.0–5.0)
HCT: 43 % (ref 39.0–52.0)
Hemoglobin: 14 g/dL (ref 13.0–17.0)
Lymphocytes Relative: 24.2 % (ref 12.0–46.0)
Lymphs Abs: 1.5 10*3/uL (ref 0.7–4.0)
MCHC: 32.7 g/dL (ref 30.0–36.0)
MCV: 92 fl (ref 78.0–100.0)
Monocytes Absolute: 0.5 10*3/uL (ref 0.1–1.0)
Monocytes Relative: 8.7 % (ref 3.0–12.0)
Neutro Abs: 3.8 10*3/uL (ref 1.4–7.7)
Neutrophils Relative %: 63.3 % (ref 43.0–77.0)
Platelets: 195 10*3/uL (ref 150.0–400.0)
RBC: 4.67 Mil/uL (ref 4.22–5.81)
RDW: 13.6 % (ref 11.5–15.5)
WBC: 6 10*3/uL (ref 4.0–10.5)

## 2022-08-24 LAB — COMPREHENSIVE METABOLIC PANEL
ALT: 11 U/L (ref 0–53)
AST: 12 U/L (ref 0–37)
Albumin: 4.1 g/dL (ref 3.5–5.2)
Alkaline Phosphatase: 34 U/L — ABNORMAL LOW (ref 39–117)
BUN: 18 mg/dL (ref 6–23)
CO2: 31 mEq/L (ref 19–32)
Calcium: 9.5 mg/dL (ref 8.4–10.5)
Chloride: 100 mEq/L (ref 96–112)
Creatinine, Ser: 1.12 mg/dL (ref 0.40–1.50)
GFR: 66.55 mL/min (ref >60.00)
Glucose, Bld: 155 mg/dL — ABNORMAL HIGH (ref 70–99)
Potassium: 4.6 mEq/L (ref 3.5–5.1)
Sodium: 137 mEq/L (ref 135–145)
Total Bilirubin: 0.7 mg/dL (ref 0.2–1.2)
Total Protein: 6.6 g/dL (ref 6.0–8.3)

## 2022-08-24 LAB — LIPID PANEL
Cholesterol: 164 mg/dL (ref 0–200)
HDL: 59.5 mg/dL (ref >39.00)
LDL Cholesterol: 91 mg/dL (ref 0–99)
NonHDL: 104.03
Total CHOL/HDL Ratio: 3
Triglycerides: 67 mg/dL (ref 0.0–149.0)
VLDL: 13.4 mg/dL (ref 0.0–40.0)

## 2022-08-24 LAB — TSH: TSH: 3.9 u[IU]/mL (ref 0.35–5.50)

## 2022-08-24 LAB — MICROALBUMIN / CREATININE URINE RATIO
Creatinine,U: 99.3 mg/dL
Microalb Creat Ratio: 0.7 mg/g (ref 0.0–30.0)
Microalb, Ur: <0.7 mg/dL (ref 0.0–1.9)

## 2022-08-27 ENCOUNTER — Other Ambulatory Visit: Payer: Self-pay | Admitting: Internal Medicine

## 2022-08-27 DIAGNOSIS — E119 Type 2 diabetes mellitus without complications: Secondary | ICD-10-CM

## 2022-08-27 MED ORDER — TRULICITY 4.5 MG/0.5ML ~~LOC~~ SOAJ
4.5000 mg | SUBCUTANEOUS | 5 refills | Status: DC
Start: 2022-08-27 — End: 2022-09-05

## 2022-08-27 MED ORDER — GLIMEPIRIDE 1 MG PO TABS: 1.0000 mg | ORAL_TABLET | Freq: Two times a day (BID) | ORAL | 1 refills | Status: DC

## 2022-09-03 ENCOUNTER — Other Ambulatory Visit: Payer: Self-pay | Admitting: Endocrinology

## 2022-09-03 DIAGNOSIS — E1165 Type 2 diabetes mellitus with hyperglycemia: Secondary | ICD-10-CM

## 2022-09-05 ENCOUNTER — Telehealth: Payer: Self-pay

## 2022-09-05 DIAGNOSIS — E1165 Type 2 diabetes mellitus with hyperglycemia: Secondary | ICD-10-CM

## 2022-09-05 MED ORDER — TRULICITY 4.5 MG/0.5ML ~~LOC~~ SOAJ
4.5000 mg | SUBCUTANEOUS | 0 refills | Status: DC
Start: 2022-09-05 — End: 2023-02-05

## 2022-09-05 NOTE — Telephone Encounter (Signed)
Arne Schlender Ann Alylah Blakney, CMA  ?

## 2022-10-04 ENCOUNTER — Other Ambulatory Visit: Payer: Self-pay | Admitting: Internal Medicine

## 2022-11-12 ENCOUNTER — Ambulatory Visit: Payer: 59 | Admitting: "Endocrinology

## 2022-11-27 ENCOUNTER — Ambulatory Visit (INDEPENDENT_AMBULATORY_CARE_PROVIDER_SITE_OTHER): Payer: 59 | Admitting: "Endocrinology

## 2022-11-27 ENCOUNTER — Encounter: Payer: Self-pay | Admitting: "Endocrinology

## 2022-11-27 VITALS — BP 130/73 | HR 62 | Ht 66.0 in | Wt 145.4 lb

## 2022-11-27 DIAGNOSIS — Z7984 Long term (current) use of oral hypoglycemic drugs: Secondary | ICD-10-CM

## 2022-11-27 DIAGNOSIS — Z794 Long term (current) use of insulin: Secondary | ICD-10-CM | POA: Diagnosis not present

## 2022-11-27 DIAGNOSIS — E1165 Type 2 diabetes mellitus with hyperglycemia: Secondary | ICD-10-CM

## 2022-11-27 DIAGNOSIS — Z7985 Long-term (current) use of injectable non-insulin antidiabetic drugs: Secondary | ICD-10-CM

## 2022-11-27 DIAGNOSIS — E7800 Pure hypercholesterolemia, unspecified: Secondary | ICD-10-CM

## 2022-11-27 LAB — POCT GLYCOSYLATED HEMOGLOBIN (HGB A1C): Hemoglobin A1C: 7.9 % — AB (ref 4.0–5.6)

## 2022-11-27 NOTE — Progress Notes (Signed)
Outpatient Endocrinology Note Altamese Damascus, MD  11/27/22   Ricardo [name] 71-May-1952 [id number]  Referring Provider: Tresa Garter, MD Primary Care Provider: Tresa Garter, MD Reason for consultation: Subjective   Assessment & Plan  Diagnoses and all orders for this visit:  Uncontrolled type 2 diabetes mellitus with hyperglycemia, without long-term current use of insulin (HCC) -     POCT glycosylated hemoglobin (Hb A1C)  Long term (current) use of oral hypoglycemic drugs  Long-term (current) use of injectable non-insulin antidiabetic drugs  Pure hypercholesterolemia    Diabetes Type II complicated by neuropathy,  Lab Results  Component Value Date   GFR 66.55 08/24/2022   Hba1c goal less than 7, current Hba1c is  Lab Results  Component Value Date   HGBA1C 7.9 (A) 11/27/2022   Will recommend the following: Trulicity 3 mg weekly, metformin 1 g twice a day, glimepiride 1 mg bid, jardiance 10 mg every day  Patient ants to wait until next visit before he makes any changes   No known contraindications/side effects to any of above medications  -Last LD and Tg are as follows: Lab Results  Component Value Date   LDLCALC 91 08/24/2022    Lab Results  Component Value Date   TRIG 67.0 08/24/2022   -On atorvastatin 20 mg QD -Follow low fat diet and exercise   -Blood pressure goal <140/90 - Microalbumin/creatinine goal is < 30 -Last MA/Cr is as follows: Lab Results  Component Value Date   MICROALBUR <0.7 08/24/2022   -not on ACE/ARB  -diet changes including salt restriction -limit eating outside -counseled BP targets per standards of diabetes care -uncontrolled blood pressure can lead to retinopathy, nephropathy and cardiovascular and atherosclerotic heart disease  Reviewed and counseled on: -A1C target -Blood sugar targets -Complications of uncontrolled diabetes  -Checking blood sugar before meals and bedtime and bring log next  visit -All medications with mechanism of action and side effects -Hypoglycemia management: rule of 15's, Glucagon Emergency Kit and medical alert ID -low-carb low-fat plate-method diet -At least 20 minutes of physical activity per day -Annual dilated retinal eye exam and foot exam -compliance and follow up needs -follow up as scheduled or earlier if problem gets worse  Call if blood sugar is less than 70 or consistently above 250    Take a 15 gm snack of carbohydrate at bedtime before you go to sleep if your blood sugar is less than 100.    If you are going to fast after midnight for a test or procedure, ask your physician for instructions on how to reduce/decrease your insulin dose.    Call if blood sugar is less than 70 or consistently above 250  -Treating a low sugar by rule of 15  (15 gms of sugar every 15 min until sugar is more than 70) If you feel your sugar is low, test your sugar to be sure If your sugar is low (less than 70), then take 15 grams of a fast acting Carbohydrate (3-4 glucose tablets or glucose gel or 4 ounces of juice or regular soda) Recheck your sugar 15 min after treating low to make sure it is more than 70 If sugar is still less than 70, treat again with 15 grams of carbohydrate          Don't drive the hour of hypoglycemia  If unconscious/unable to eat or drink by mouth, use glucagon injection or nasal spray baqsimi and call 911. Can repeat again in 15 min  if still unconscious.  Return in about 3 months (around 02/27/2023).   I have reviewed current medications, nurse's notes, allergies, vital signs, past medical and surgical history, family medical history, and social history for this encounter. Counseled patient on symptoms, examination findings, lab findings, imaging results, treatment decisions and monitoring and prognosis. The patient understood the recommendations and agrees with the treatment plan. All questions regarding treatment plan were fully  answered.  Altamese Grambling, MD  11/27/22   History of Present Illness Ricardo ARAVE is a 72 y.o. year old male who presents for evaluation of Type II diabetes mellitus.  Ricardo [name] was first diagnosed in 2009.   Diabetes education +  Home diabetes regimen: Trulicity 3 mg weekly, metformin 1 g twice a day, glimepiride 1 mg every day, jardiance 10 mg every day   Past history: At diagnosis he was having symptoms of fatigue.  He was started on glipizide initially and subsequently metformin was added.  Further details are not available However he has been somewhat irregular with his follow-up and did not have an A1c with his PCP between 8/13 and 11/15 He was referred here because of rising A1c of 7.5 He had been started on Trulicity in 4/17 because of gradually increasing A1c He stopped taking Trulicity in 2018 because of out-of-pocket expense glipizide ER 2.5 mg  COMPLICATIONS -  MI/Stroke -  retinopathy +  neuropathy -  nephropathy  BLOOD SUGAR DATA Fasting 102-211 Checks 2x/day  Physical Exam  BP 130/73   Pulse 62   Ht 5\' 6"  (1.676 m)   Wt 145 lb 6.4 oz (66 kg)   SpO2 96%   BMI 23.47 kg/m    Constitutional: well developed, well nourished Head: normocephalic, atraumatic Eyes: sclera anicteric, no redness Neck: supple Lungs: normal respiratory effort Neurology: alert and oriented Skin: dry, no appreciable rashes Musculoskeletal: no appreciable defects Psychiatric: normal mood and affect Diabetic Foot Exam - Simple   Simple Foot Form Diabetic Foot exam was performed with the following findings: Yes 11/27/2022  8:36 AM  Visual Inspection No deformities, no ulcerations, no other skin breakdown bilaterally: Yes Sensation Testing Intact to touch and monofilament testing bilaterally: Yes Pulse Check Posterior Tibialis and Dorsalis pulse intact bilaterally: Yes Comments      Current Medications Patient's Medications  New Prescriptions   No medications  on file  Previous Medications   ALFUZOSIN (UROXATRAL) 10 MG 24 HR TABLET    Take 10 mg by mouth daily with breakfast.   ASPIRIN 81 MG TABLET    Take 81 mg by mouth daily.   ATORVASTATIN (LIPITOR) 20 MG TABLET    TAKE 1 TABLET BY MOUTH EVERY DAY   BLOOD GLUCOSE MONITORING SUPPL (ONETOUCH VERIO) W/DEVICE KIT    Check sugar 3-4 times daily. E11.65   DULAGLUTIDE (TRULICITY) 4.5 MG/0.5ML SOPN    Inject 4.5 mg as directed once a week.   DUTASTERIDE (AVODART) 0.5 MG CAPSULE    Take 0.5 mg by mouth daily.   GLIMEPIRIDE (AMARYL) 1 MG TABLET    Take 1 tablet (1 mg total) by mouth 2 (two) times daily.   LANCETS (ONETOUCH ULTRASOFT) LANCETS    Use as instructed.Check sugar 3-4 times daily. E11.65   LEVOTHYROXINE (SYNTHROID) 100 MCG TABLET    TAKE 1 TABLET BY MOUTH EVERY DAY   METFORMIN (GLUCOPHAGE) 1000 MG TABLET    Take 1 tablet (1,000 mg total) by mouth 2 (two) times daily with a meal.   ONETOUCH VERIO TEST STRIP  USE TO TEST 3 TO 4 TIMES DAILY AS DIRECTED   VALACYCLOVIR (VALTREX) 500 MG TABLET    Take 1 tablet (500 mg total) by mouth daily.  Modified Medications   No medications on file  Discontinued Medications   JARDIANCE 10 MG TABS TABLET    TAKE 1 TABLET (10 MG TOTAL) BY MOUTH DAILY WITH BREAKFAST.    Allergies No Known Allergies  Past Medical History Past Medical History:  Diagnosis Date   ARTHRITIS, HIP 08/10/2008   BURSITIS, RIGHT SHOULDER 05/20/2008   Degeneration of lumbar or lumbosacral intervertebral disc 11/27/2006   DEGENERATION, CERVICAL DISC 11/27/2006   Degenerative disc disease, lumbar    Depression    no per pt 03-30-19   DEPRESSION 11/25/2006   Diabetes mellitus    DIABETES MELLITUS, TYPE II 04/10/2007   Epicondylitis    bilateral   Fatigue    HERPES GENITALIS 09/17/2007   Hyperlipemia    HYPERLIPIDEMIA 03/31/2007   Hypertension    HYPERTENSION 11/25/2006   HYPOTHYROIDISM 10/28/2007   Impaired fasting glucose 12/26/2006   Lateral epicondylitis  of elbow 05/05/2007    Sciatica 10/28/2007   SHOULDER PAIN, RIGHT 08/18/2008    Past Surgical History Past Surgical History:  Procedure Laterality Date   ACHILLES TENDON REPAIR     left   COLONOSCOPY     ELBOW SURGERY  2005   left   herniated lumbar disc  1997, 2011   KNEE ARTHROSCOPY  2007   left   ROTATOR CUFF REPAIR  1997   right    Family History family history includes Diabetes in his mother and sister; Heart disease in his father and mother.  Social History Social History   Socioeconomic History   Marital status: Married    Spouse name: Not on file   Number of children: Not on file   Years of education: Not on file   Highest education level: Not on file  Occupational History   Not on file  Tobacco Use   Smoking status: Never   Smokeless tobacco: Never  Vaping Use   Vaping status: Never Used  Substance and Sexual Activity   Alcohol use: Yes    Alcohol/week: 3.0 standard drinks of alcohol    Types: 3 Cans of beer per week    Comment: occ    Drug use: No   Sexual activity: Yes  Other Topics Concern   Not on file  Social History Narrative   Not on file   Social Determinants of Health   Financial Resource Strain: Not on file  Food Insecurity: Not on file  Transportation Needs: Not on file  Physical Activity: Not on file  Stress: Not on file  Social Connections: Not on file  Intimate Partner Violence: Not on file    Lab Results  Component Value Date   HGBA1C 7.9 (A) 11/27/2022   HGBA1C 8.6 (H) 08/24/2022   HGBA1C 7.6 (H) 04/27/2022   Lab Results  Component Value Date   CHOL 164 08/24/2022   Lab Results  Component Value Date   HDL 59.50 08/24/2022   Lab Results  Component Value Date   LDLCALC 91 08/24/2022   Lab Results  Component Value Date   TRIG 67.0 08/24/2022   Lab Results  Component Value Date   CHOLHDL 3 08/24/2022   Lab Results  Component Value Date   CREATININE 1.12 08/24/2022   Lab Results  Component Value Date   GFR 66.55 08/24/2022   Lab  Results  Component Value  Date   MICROALBUR <0.7 08/24/2022      Component Value Date/Time   NA 137 08/24/2022 0812   NA 145 (H) 02/05/2018 1342   K 4.6 08/24/2022 0812   CL 100 08/24/2022 0812   CO2 31 08/24/2022 0812   GLUCOSE 155 (H) 08/24/2022 0812   BUN 18 08/24/2022 0812   BUN 13 02/05/2018 1342   CREATININE 1.12 08/24/2022 0812   CALCIUM 9.5 08/24/2022 0812   PROT 6.6 08/24/2022 0812   ALBUMIN 4.1 08/24/2022 0812   AST 12 08/24/2022 0812   ALT 11 08/24/2022 0812   ALKPHOS 34 (L) 08/24/2022 0812   BILITOT 0.7 08/24/2022 0812   GFRNONAA 64 02/05/2018 1342   GFRAA 74 02/05/2018 1342      Latest Ref Rng & Units 08/24/2022    8:12 AM 04/27/2022    8:21 AM 02/08/2022    8:15 AM  BMP  Glucose 70 - 99 mg/dL 161  096  045   BUN 6 - 23 mg/dL 18  15  20    Creatinine 0.40 - 1.50 mg/dL 4.09  8.11  9.14   Sodium 135 - 145 mEq/L 137  137  138   Potassium 3.5 - 5.1 mEq/L 4.6  4.6  4.6   Chloride 96 - 112 mEq/L 100  99  101   CO2 19 - 32 mEq/L 31  32  32   Calcium 8.4 - 10.5 mg/dL 9.5  9.9  9.9        Component Value Date/Time   WBC 6.0 08/24/2022 0812   RBC 4.67 08/24/2022 0812   HGB 14.0 08/24/2022 0812   HCT 43.0 08/24/2022 0812   PLT 195.0 08/24/2022 0812   MCV 92.0 08/24/2022 0812   MCHC 32.7 08/24/2022 0812   RDW 13.6 08/24/2022 0812   LYMPHSABS 1.5 08/24/2022 0812   MONOABS 0.5 08/24/2022 0812   EOSABS 0.2 08/24/2022 0812   BASOSABS 0.0 08/24/2022 0812     Parts of this note may have been dictated using voice recognition software. There may be variances in spelling and vocabulary which are unintentional. Not all errors are proofread. Please notify the Thereasa Parkin if any discrepancies are noted or if the meaning of any statement is not clear.

## 2022-11-27 NOTE — Patient Instructions (Signed)

## 2022-12-06 ENCOUNTER — Other Ambulatory Visit: Payer: Self-pay

## 2022-12-06 DIAGNOSIS — E1165 Type 2 diabetes mellitus with hyperglycemia: Secondary | ICD-10-CM

## 2022-12-06 MED ORDER — METFORMIN HCL 1000 MG PO TABS: 1000.0000 mg | ORAL_TABLET | Freq: Two times a day (BID) | ORAL | 3 refills | Status: AC

## 2022-12-26 NOTE — Progress Notes (Unsigned)
[name] [address] [place] [address] Phone: [phone] Subjective:   Ricardo [name], am serving as a scribe for Dr. Antoine [name].  I'm seeing this patient by the request  of:  [name], Ricardo Quint, MD  CC: Knee pain follow-up  [id number]  08/02/2022 Overall seems to be doing significantly better.  Discussed with patient that I do think that this will continue to improve but I do feel the underlying arthritis will have some exacerbations from time to time.  Follow-up with me again in 3 months      Update 12/27/2022 Ricardo [name] is a 72 y.o. male coming in with complaint of R knee pain.  Found to have a meniscal injury previously.  Patient states last week was not a good week. Today better.      Past Medical History:  Diagnosis Date   ARTHRITIS, HIP 08/10/2008   BURSITIS, RIGHT SHOULDER 05/20/2008   Degeneration of lumbar or lumbosacral intervertebral disc 11/27/2006   DEGENERATION, CERVICAL DISC 11/27/2006   Degenerative disc disease, lumbar    Depression    no per pt 03-30-19   DEPRESSION 11/25/2006   Diabetes mellitus    DIABETES MELLITUS, TYPE II 04/10/2007   Epicondylitis    bilateral   Fatigue    HERPES GENITALIS 09/17/2007   Hyperlipemia    HYPERLIPIDEMIA 03/31/2007   Hypertension    HYPERTENSION 11/25/2006   HYPOTHYROIDISM 10/28/2007   Impaired fasting glucose 12/26/2006   Lateral epicondylitis  of elbow 05/05/2007   Sciatica 10/28/2007   SHOULDER PAIN, RIGHT 08/18/2008   Past Surgical History:  Procedure Laterality Date   ACHILLES TENDON REPAIR     left   COLONOSCOPY     ELBOW SURGERY  2005   left   herniated lumbar disc  1997, 2011   KNEE ARTHROSCOPY  2007   left   ROTATOR CUFF REPAIR  1997   right   Social History   Socioeconomic History   Marital status: Married    Spouse name: Not on file   Number of children: Not on file   Years of education: Not on file   Highest education level: Not on file   Occupational History   Not on file  Tobacco Use   Smoking status: Never   Smokeless tobacco: Never  Vaping Use   Vaping status: Never Used  Substance and Sexual Activity   Alcohol use: Yes    Alcohol/week: 3.0 standard drinks of alcohol    Types: 3 Cans of beer per week    Comment: occ    Drug use: No   Sexual activity: Yes  Other Topics Concern   Not on file  Social History Narrative   Not on file   Social Determinants of Health   Financial Resource Strain: Not on file  Food Insecurity: Not on file  Transportation Needs: Not on file  Physical Activity: Not on file  Stress: Not on file  Social Connections: Not on file   No Known Allergies Family History  Problem Relation Age of Onset   Diabetes Mother    Heart disease Mother    Heart disease Father    Diabetes Sister    Colon cancer Neg Hx    Esophageal cancer Neg Hx    Rectal cancer Neg Hx    Stomach cancer Neg Hx     Current Outpatient Medications (Endocrine & Metabolic):    Dulaglutide (TRULICITY) 4.5 MG/0.5ML SOPN, Inject 4.5 mg as directed once  a week. (Patient not taking: Reported on 11/27/2022)   glimepiride (AMARYL) 1 MG tablet, Take 1 tablet (1 mg total) by mouth 2 (two) times daily. (Patient taking differently: Take 1 mg by mouth daily.)   levothyroxine (SYNTHROID) 100 MCG tablet, TAKE 1 TABLET BY MOUTH EVERY DAY   metFORMIN (GLUCOPHAGE) 1000 MG tablet, Take 1 tablet (1,000 mg total) by mouth 2 (two) times daily with a meal.  Current Outpatient Medications (Cardiovascular):    atorvastatin (LIPITOR) 20 MG tablet, TAKE 1 TABLET BY MOUTH EVERY DAY   Current Outpatient Medications (Analgesics):    aspirin 81 MG tablet, Take 81 mg by mouth daily.   Current Outpatient Medications (Other):    alfuzosin (UROXATRAL) 10 MG 24 hr tablet, Take 10 mg by mouth daily with breakfast. (Patient not taking: Reported on 11/27/2022)   Blood Glucose Monitoring Suppl (ONETOUCH VERIO) w/Device KIT, Check sugar 3-4 times  daily. E11.65   dutasteride (AVODART) 0.5 MG capsule, Take 0.5 mg by mouth daily.   Lancets (ONETOUCH ULTRASOFT) lancets, Use as instructed.Check sugar 3-4 times daily. E11.65   ONETOUCH VERIO test strip, USE TO TEST 3 TO 4 TIMES DAILY AS DIRECTED   valACYclovir (VALTREX) 500 MG tablet, Take 1 tablet (500 mg total) by mouth daily.   Objective  Blood pressure 124/82, pulse 65, height 5\' 6"  (1.676 m), weight 140 lb (63.5 kg), SpO2 97%.   General: No apparent distress alert and oriented x3 mood and affect normal, dressed appropriately.  HEENT: Pupils equal, extraocular movements intact  Respiratory: Patient's speak in full sentences and does not appear short of breath  Cardiovascular: No lower extremity edema, non tender, no erythema  Right knee exam shows patient does have still some tenderness to palpation over the medial joint line.  Patient has good range of motion no noted.  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  Patient does have some narrowing of the patellofemoral and medial joint space. Impression: Arthritic changes.  Continued abnormality of the meniscus.    Impression and Recommendations:      The above documentation has been reviewed and is accurate and complete Ricardo Saa, DO

## 2022-12-27 ENCOUNTER — Ambulatory Visit (INDEPENDENT_AMBULATORY_CARE_PROVIDER_SITE_OTHER): Payer: 59 | Admitting: Family Medicine

## 2022-12-27 ENCOUNTER — Other Ambulatory Visit: Payer: Self-pay

## 2022-12-27 ENCOUNTER — Encounter: Payer: Self-pay | Admitting: Family Medicine

## 2022-12-27 VITALS — BP 124/82 | HR 65 | Ht 66.0 in | Wt 140.0 lb

## 2022-12-27 DIAGNOSIS — M25561 Pain in right knee: Secondary | ICD-10-CM

## 2022-12-27 DIAGNOSIS — S83241A Other tear of medial meniscus, current injury, right knee, initial encounter: Secondary | ICD-10-CM

## 2022-12-27 NOTE — Patient Instructions (Addendum)
Good to see you! Vitamin D3 2000IU daily  Turmeric 500mg  daily  Tart cherry extract any dose at night 1200-2400mg   See you again in 3 months

## 2022-12-27 NOTE — Assessment & Plan Note (Signed)
Patient has done well with the injection at this time.  Nearly 6 months at this time.  Will hold on any other injection with patient doing well and follow-up with me again in 3 to 6 months

## 2023-01-29 ENCOUNTER — Ambulatory Visit: Payer: Self-pay | Admitting: General Surgery

## 2023-01-29 NOTE — Progress Notes (Signed)
Sent message, via epic in basket, requesting orders in epic from surgeon.  

## 2023-01-29 NOTE — Patient Instructions (Signed)
SURGICAL WAITING ROOM VISITATION Patients having surgery or a procedure may have no more than 2 support people in the waiting area - these visitors may rotate.    Children under the age of 96 must have an adult with them who is not the patient.  If the patient needs to stay at the hospital during part of their recovery, the visitor guidelines for inpatient rooms apply. Pre-op nurse will coordinate an appropriate time for 1 support person to accompany patient in pre-op.  This support person may not rotate.    Please refer to the [facility] website for the visitor guidelines for Inpatients (after your surgery is over and you are in a regular room).       Your procedure is scheduled on: 02-14-23   Report to [facility] Main Entrance    Report to admitting at 5:15 AM   Call this number if you have problems the morning of surgery [phone]   Do not eat food :After Midnight.   After Midnight you may have the following liquids until 4:30 AM DAY OF SURGERY  Water Non-Citrus Juices (without pulp, NO RED-Apple, White grape, White cranberry) Black Coffee (NO MILK/CREAM OR CREAMERS, sugar ok)  Clear Tea (NO MILK/CREAM OR CREAMERS, sugar ok) regular and decaf                             Plain Jell-O (NO RED)                                           Fruit ices (not with fruit pulp, NO RED)                                     Popsicles (NO RED)                                                               Sports drinks like Gatorade (NO RED)                   The day of surgery:  Drink ONE (1) Pre-Surgery G2 by 4:30 AM the morning of surgery. Drink in one sitting. Do not sip.  This drink was given to you during your hospital  pre-op appointment visit. Nothing else to drink after completing the Pre-Surgery G2.          If you have questions, please contact your surgeon's office.   FOLLOW  ANY ADDITIONAL PRE OP INSTRUCTIONS YOU RECEIVED FROM YOUR SURGEON'S OFFICE!!!      Oral Hygiene is also important to reduce your risk of infection.                                    Remember - BRUSH YOUR TEETH THE MORNING OF SURGERY WITH YOUR REGULAR TOOTHPASTE   Do NOT smoke after Midnight   Take these medicines the morning of surgery with A SIP OF WATER:   Atorvastatin  Dutasteride  Levothyroxine  Valacyclovir  Stop all vitamins and herbal supplements 7 days before surgery  How to Manage Your Diabetes Before and After Surgery  Why is it important to control my blood sugar before and after surgery? Improving blood sugar levels before and after surgery helps healing and can limit problems. A way of improving blood sugar control is eating a healthy diet by:  Eating less sugar and carbohydrates  Increasing activity/exercise  Talking with your doctor about reaching your blood sugar goals High blood sugars (greater than 180 mg/dL) can raise your risk of infections and slow your recovery, so you will need to focus on controlling your diabetes during the weeks before surgery. Make sure that the doctor who takes care of your diabetes knows about your planned surgery including the date and location.  How do I manage my blood sugar before surgery? Check your blood sugar at least 4 times a day, starting 2 days before surgery, to make sure that the level is not too high or low. Check your blood sugar the morning of your surgery when you wake up and every 2 hours until you get to the Short Stay unit. If your blood sugar is less than 70 mg/dL, you will need to treat for low blood sugar: Do not take insulin. Treat a low blood sugar (less than 70 mg/dL) with  cup of clear juice (cranberry or apple), 4 glucose tablets, OR glucose gel. Recheck blood sugar in 15 minutes after treatment (to make sure it is greater than 70 mg/dL). If your blood sugar is not greater than 70 mg/dL on recheck, call [phone] for further instructions. Report your blood sugar to the short stay nurse  when you get to Short Stay.  If you are admitted to the hospital after surgery: Your blood sugar will be checked by the staff and you will probably be given insulin after surgery (instead of oral diabetes medicines) to make sure you have good blood sugar levels. The goal for blood sugar control after surgery is 80-180 mg/dL.   WHAT DO I DO ABOUT MY DIABETES MEDICATION?  Do not take oral diabetes medicines (pills) the morning of surgery.  Hold Jardiance 3 days before surgery (do not take after 02-10-23)  DO NOT TAKE THE FOLLOWING 7 DAYS PRIOR TO SURGERY: Ozempic, Wegovy, Rybelsus (Semaglutide), Byetta (exenatide), Bydureon (exenatide ER), Victoza, Saxenda (liraglutide), or Trulicity (dulaglutide) Mounjaro (Tirzepatide) Adlyxin (Lixisenatide), Polyethylene Glycol Loxenatide.  Reviewed and Endorsed by [facility] Patient Education Committee, August 2015  Bring CPAP mask and tubing day of surgery.                              You may not have any metal on your body including  jewelry, and body piercing             Do not wear lotions, powders, cologne, or deodorant              Men may shave face and neck.   Do not bring valuables to the hospital.  IS NOT RESPONSIBLE   FOR VALUABLES.   Contacts, dentures or bridgework may not be worn into surgery.  DO NOT BRING YOUR HOME MEDICATIONS TO THE HOSPITAL. PHARMACY WILL DISPENSE MEDICATIONS LISTED ON YOUR MEDICATION LIST TO YOU DURING YOUR ADMISSION IN THE HOSPITAL!    Patients discharged on the day of surgery will not be allowed to drive home.  Someone NEEDS to stay with you for the first  24 hours after anesthesia.   Special Instructions: Bring a copy of your healthcare power of attorney and living will documents the day of surgery if you haven't scanned them before.              Please read over the following fact sheets you were given: IF YOU HAVE QUESTIONS ABOUT YOUR PRE-OP INSTRUCTIONS PLEASE CALL [phone] Gwen  If you  received a COVID test during your pre-op visit  it is requested that you wear a mask when out in public, stay away from anyone that may not be feeling well and notify your surgeon if you develop symptoms. If you test positive for Covid or have been in contact with anyone that has tested positive in the last 10 days please notify you surgeon.  [place] - Preparing for Surgery Before surgery, you can play an important role.  Because skin is not sterile, your skin needs to be as free of germs as possible.  You can reduce the number of germs on your skin by washing with CHG (chlorahexidine gluconate) soap before surgery.  CHG is an antiseptic cleaner which kills germs and bonds with the skin to continue killing germs even after washing. Please DO NOT use if you have an allergy to CHG or antibacterial soaps.  If your skin becomes reddened/irritated stop using the CHG and inform your nurse when you arrive at Short Stay. Do not shave (including legs and underarms) for at least 48 hours prior to the first CHG shower.  You may shave your face/neck.  Please follow these instructions carefully:  1.  Shower with CHG Soap the night before surgery and the  morning of surgery.  2.  If you choose to wash your hair, wash your hair first as usual with your normal  shampoo.  3.  After you shampoo, rinse your hair and body thoroughly to remove the shampoo.                             4.  Use CHG as you would any other liquid soap.  You can apply chg directly to the skin and wash.  Gently with a scrungie or clean washcloth.  5.  Apply the CHG Soap to your body ONLY FROM THE NECK DOWN.   Do   not use on face/ open                           Wound or open sores. Avoid contact with eyes, ears mouth and   genitals (private parts).                       Wash face,  Genitals (private parts) with your normal soap.             6.  Wash thoroughly, paying special attention to the area where your    surgery  will be  performed.  7.  Thoroughly rinse your body with warm water from the neck down.  8.  DO NOT shower/wash with your normal soap after using and rinsing off the CHG Soap.                9.  Pat yourself dry with a clean towel.            10.  Wear clean pajamas.            11.  Place clean sheets on your bed the night of your first shower and do not  sleep with pets. Day of Surgery : Do not apply any lotions/deodorants the morning of surgery.  Please wear clean clothes to the hospital/surgery center.  FAILURE TO FOLLOW THESE INSTRUCTIONS MAY RESULT IN THE CANCELLATION OF YOUR SURGERY  PATIENT SIGNATURE_________________________________  NURSE SIGNATURE__________________________________  ________________________________________________________________________

## 2023-01-29 NOTE — Progress Notes (Addendum)
COVID Vaccine Completed:  Yes  Date of COVID positive in last 90 days:  PCP - Jacinta Shoe, MD Cardiologist - Lance Muss, MD (last OV 2019) Pulmonologist - Chilton Greathouse, MD (last OV 2020)  Chest x-ray -  EKG -  Stress Test -  ECHO -  Cardiac Cath -  Pacemaker/ICD device last checked: Spinal Cord Stimulator: Coronary CT - 02-18-18 Epic  Bowel Prep -   Sleep Study -  CPAP -   Fasting Blood Sugar -  Checks Blood Sugar _____ times a day  Last dose of GLP1 agonist-  N/A GLP1 instructions:  Hold 7 days before surgery    Jardiance Last dose of SGLT-2 inhibitors-  02-10-23 SGLT-2 instructions:  Hold 3 days before surgery    Blood Thinner Instructions:  Time Aspirin Instructions: Last Dose:  Activity level:  Can go up a flight of stairs and perform activities of daily living without stopping and without symptoms of chest pain or shortness of breath.  Able to exercise without symptoms  Unable to go up a flight of stairs without symptoms of     Anesthesia review:  Pulmonary fibrosis  Patient denies shortness of breath, fever, cough and chest pain at PAT appointment  Patient verbalized understanding of instructions that were given to them at the PAT appointment. Patient was also instructed that they will need to review over the PAT instructions again at home before surgery.

## 2023-01-31 ENCOUNTER — Other Ambulatory Visit: Payer: Self-pay

## 2023-01-31 ENCOUNTER — Encounter (HOSPITAL_COMMUNITY): Payer: Self-pay

## 2023-01-31 ENCOUNTER — Encounter (HOSPITAL_COMMUNITY)
Admission: RE | Admit: 2023-01-31 | Discharge: 2023-01-31 | Disposition: A | Discharge status: Home / Self Care | Payer: 59 | Source: Ambulatory Visit | Attending: General Surgery | Admitting: General Surgery

## 2023-01-31 VITALS — BP 153/87 | HR 68 | Temp 98.3°F | Resp 16 | Ht 66.0 in | Wt 145.6 lb

## 2023-01-31 DIAGNOSIS — E119 Type 2 diabetes mellitus without complications: Secondary | ICD-10-CM | POA: Insufficient documentation

## 2023-01-31 DIAGNOSIS — R9431 Abnormal electrocardiogram [ECG] [EKG]: Secondary | ICD-10-CM | POA: Insufficient documentation

## 2023-01-31 DIAGNOSIS — I10 Essential (primary) hypertension: Secondary | ICD-10-CM | POA: Insufficient documentation

## 2023-01-31 DIAGNOSIS — Z01818 Encounter for other preprocedural examination: Secondary | ICD-10-CM | POA: Insufficient documentation

## 2023-01-31 HISTORY — DX: Other specified postprocedural states: Z98.890

## 2023-01-31 HISTORY — DX: Pulmonary fibrosis, unspecified: J84.10

## 2023-01-31 HISTORY — DX: Dyspnea, unspecified: R06.00

## 2023-01-31 HISTORY — DX: Methicillin resistant Staphylococcus aureus infection, unspecified site: A49.02

## 2023-01-31 LAB — CBC
HCT: 41.5 % (ref 39.0–52.0)
Hemoglobin: 13.6 g/dL (ref 13.0–17.0)
MCH: 30.6 pg (ref 26.0–34.0)
MCHC: 32.8 g/dL (ref 30.0–36.0)
MCV: 93.3 fL (ref 80.0–100.0)
Platelets: 210 10*3/uL (ref 150–400)
RBC: 4.45 MIL/uL (ref 4.22–5.81)
RDW: 12.4 % (ref 11.5–15.5)
WBC: 7.2 10*3/uL (ref 4.0–10.5)
nRBC: 0 % (ref 0.0–0.2)

## 2023-01-31 LAB — BASIC METABOLIC PANEL
Anion gap: 9 (ref 5–15)
BUN: 19 mg/dL (ref 8–23)
CO2: 26 mmol/L (ref 22–32)
Calcium: 9.5 mg/dL (ref 8.9–10.3)
Chloride: 103 mmol/L (ref 98–111)
Creatinine, Ser: 1.19 mg/dL (ref 0.61–1.24)
GFR, Estimated: >60 mL/min (ref >60)
Glucose, Bld: 186 mg/dL — ABNORMAL HIGH (ref 70–99)
Potassium: 4.5 mmol/L (ref 3.5–5.1)
Sodium: 138 mmol/L (ref 135–145)

## 2023-01-31 LAB — GLUCOSE, CAPILLARY: Glucose-Capillary: 195 mg/dL — ABNORMAL HIGH (ref 70–99)

## 2023-01-31 LAB — SURGICAL PCR SCREEN
MRSA, PCR: NEGATIVE
Staphylococcus aureus: POSITIVE — AB

## 2023-01-31 LAB — HEMOGLOBIN A1C
Hgb A1c MFr Bld: 9.6 % — ABNORMAL HIGH (ref 4.8–5.6)
Mean Plasma Glucose: 228.82 mg/dL

## 2023-01-31 NOTE — Progress Notes (Signed)
PCR results sent to Dr. Andrey Campanile to review.

## 2023-02-04 ENCOUNTER — Telehealth: Payer: Self-pay

## 2023-02-04 ENCOUNTER — Telehealth: Payer: Self-pay | Admitting: Internal Medicine

## 2023-02-04 NOTE — Telephone Encounter (Signed)
My [place] is concerned that when they checked is A1C prior it was 9.6 and the surgeon is going forward with the surgery on 02/14/23 ,he is asking will this be ok , Please advise?

## 2023-02-04 NOTE — Telephone Encounter (Signed)
Pt called wanting DR. Plot advice about him suppose to be having a surgery December the 19th and his other Dr. Notice that his blood sugar is too high and they don't like perform surgery if its over.8 Please advise.

## 2023-02-05 ENCOUNTER — Ambulatory Visit (INDEPENDENT_AMBULATORY_CARE_PROVIDER_SITE_OTHER): Payer: 59 | Admitting: "Endocrinology

## 2023-02-05 ENCOUNTER — Encounter: Payer: Self-pay | Admitting: "Endocrinology

## 2023-02-05 VITALS — BP 140/74 | HR 70 | Ht 66.0 in | Wt 145.2 lb

## 2023-02-05 DIAGNOSIS — Z7985 Long-term (current) use of injectable non-insulin antidiabetic drugs: Secondary | ICD-10-CM | POA: Diagnosis not present

## 2023-02-05 DIAGNOSIS — Z7984 Long term (current) use of oral hypoglycemic drugs: Secondary | ICD-10-CM

## 2023-02-05 DIAGNOSIS — E1165 Type 2 diabetes mellitus with hyperglycemia: Secondary | ICD-10-CM

## 2023-02-05 DIAGNOSIS — E7800 Pure hypercholesterolemia, unspecified: Secondary | ICD-10-CM | POA: Diagnosis not present

## 2023-02-05 MED ORDER — EMPAGLIFLOZIN 25 MG PO TABS: 25.0000 mg | ORAL_TABLET | Freq: Every day | ORAL | 1 refills | Status: DC

## 2023-02-05 MED ORDER — INSULIN LISPRO (1 UNIT DIAL) 100 UNIT/ML (KWIKPEN): PEN_INJECTOR | SUBCUTANEOUS | 11 refills | Status: AC

## 2023-02-05 MED ORDER — GLIMEPIRIDE 1 MG PO TABS: 2.0000 mg | ORAL_TABLET | Freq: Two times a day (BID) | ORAL | 2 refills | Status: DC

## 2023-02-05 MED ORDER — TRULICITY 1.5 MG/0.5ML ~~LOC~~ SOAJ: 1.5000 mg | SUBCUTANEOUS | 1 refills | Status: DC

## 2023-02-05 NOTE — Patient Instructions (Signed)
metformin 1 g twice a day, glimepiride 1 mg up to 2 pills twice a day, jardiance 25 mg every day , Trulicity 1.5 mg/week  Humalog based on sliding scale  Correction scale:   151 - 190: 1 unit 191 - 230: 2 units 231 - 270: 3 units 271 - 310: 4 units 311 - 350: 5 units 351 - 390: 6 units 391 - 430: 7 units

## 2023-02-05 NOTE — Progress Notes (Addendum)
Outpatient Endocrinology Note [name], MD  02/05/23   Ricardo [name] 30-Jan-1951 [id number]  Referring Provider: Tresa Garter, MD Primary Care Provider: Tresa Garter, MD Reason for consultation: Subjective   Assessment & Plan  Diagnoses and all orders for this visit:  Uncontrolled type 2 diabetes mellitus with hyperglycemia, without long-term current use of insulin (HCC) -     Dulaglutide (TRULICITY) 1.5 MG/0.5ML SOAJ; Inject 1.5 mg into the skin once a week. -     empagliflozin (JARDIANCE) 25 MG TABS tablet; Take 1 tablet (25 mg total) by mouth daily before breakfast. -     insulin lispro (HUMALOG KWIKPEN) 100 UNIT/ML KwikPen; 151 - 190: 1 unit, 191 - 230: 2 units, 231 - 270: 3 units, 271 - 310: 4 units, 311 - 350: 5 units, 351 - 390: 6 units,  391 - 430: 7 units  Long term (current) use of oral hypoglycemic drugs  Long-term (current) use of injectable non-insulin antidiabetic drugs  Pure hypercholesterolemia  Other orders -     glimepiride (AMARYL) 1 MG tablet; Take 2 tablets (2 mg total) by mouth 2 (two) times daily.     Diabetes Type II complicated by neuropathy,  Lab Results  Component Value Date   GFR 66.55 08/24/2022   Hba1c goal less than 7, current Hba1c is  Lab Results  Component Value Date   HGBA1C 9.6 (H) 01/31/2023   Will recommend the following: metformin 1 g twice a day, glimepiride 1 mg up to 2 pills twice a day, jardiance 25 mg every day  Trulicity 1.5 mg weekly (max tolerated dose)  Humalog based on sliding scale  Correction scale:   151 - 190: 1 unit 191 - 230: 2 units 231 - 270: 3 units 271 - 310: 4 units 311 - 350: 5 units 351 - 390: 6 units 391 - 430: 7 units   Self stopped Trulicity 3 mg weekly due to nausea, mounjaro not covered   No known contraindications/side effects to any of above medications  -Last LD and Tg are as follows: Lab Results  Component Value Date   LDLCALC 91 08/24/2022    Lab  Results  Component Value Date   TRIG 67.0 08/24/2022   -On atorvastatin 20 mg QD -Follow low fat diet and exercise   -Blood pressure goal <140/90 - Microalbumin/creatinine goal is < 30 -Last MA/Cr is as follows: Lab Results  Component Value Date   MICROALBUR <0.7 08/24/2022   -not on ACE/ARB  -diet changes including salt restriction -limit eating outside -counseled BP targets per standards of diabetes care -uncontrolled blood pressure can lead to retinopathy, nephropathy and cardiovascular and atherosclerotic heart disease  Reviewed and counseled on: -A1C target -Blood sugar targets -Complications of uncontrolled diabetes  -Checking blood sugar before meals and bedtime and bring log next visit -All medications with mechanism of action and side effects -Hypoglycemia management: rule of 15's, Glucagon Emergency Kit and medical alert ID -low-carb low-fat plate-method diet -At least 20 minutes of physical activity per day -Annual dilated retinal eye exam and foot exam -compliance and follow up needs -follow up as scheduled or earlier if problem gets worse  Call if blood sugar is less than 70 or consistently above 250    Take a 15 gm snack of carbohydrate at bedtime before you go to sleep if your blood sugar is less than 100.    If you are going to fast after midnight for a test or procedure, ask your  physician for instructions on how to reduce/decrease your insulin dose.    Call if blood sugar is less than 70 or consistently above 250  -Treating a low sugar by rule of 15  (15 gms of sugar every 15 min until sugar is more than 70) If you feel your sugar is low, test your sugar to be sure If your sugar is low (less than 70), then take 15 grams of a fast acting Carbohydrate (3-4 glucose tablets or glucose gel or 4 ounces of juice or regular soda) Recheck your sugar 15 min after treating low to make sure it is more than 70 If sugar is still less than 70, treat again with 15 grams  of carbohydrate          Don't drive the hour of hypoglycemia  If unconscious/unable to eat or drink by mouth, use glucagon injection or nasal spray baqsimi and call 911. Can repeat again in 15 min if still unconscious.  No follow-ups on file.   I have reviewed current medications, nurse's notes, allergies, vital signs, past medical and surgical history, family medical history, and social history for this encounter. Counseled patient on symptoms, examination findings, lab findings, imaging results, treatment decisions and monitoring and prognosis. The patient understood the recommendations and agrees with the treatment plan. All questions regarding treatment plan were fully answered.  Altamese Iatan, MD  02/05/23   History of Present Illness Ricardo [name] is a 72 y.o. year old male who presents for evaluation of Type II diabetes mellitus.  Ricardo [name] was first diagnosed in 2009.   Diabetes education +  Home diabetes regimen: Trulicity 3 mg weekly, metformin 1 g twice a day, glimepiride 1 mg every day, jardiance 10 mg every day   Past history: At diagnosis he was having symptoms of fatigue.  He was started on glipizide initially and subsequently metformin was added.  Further details are not available However he has been somewhat irregular with his follow-up and did not have an A1c with his PCP between 8/13 and 11/15 He was referred here because of rising A1c of 7.5 He had been started on Trulicity in 4/17 because of gradually increasing A1c He stopped taking Trulicity in 2018 because of out-of-pocket expense glipizide ER 2.5 mg  COMPLICATIONS -  MI/Stroke -  retinopathy +  neuropathy -  nephropathy  BLOOD SUGAR DATA Did not bring meter Per recall BG of 170s qam  Physical Exam  BP (!) 140/74   Pulse 70   Ht 5\' 6"  (1.676 m)   Wt 145 lb 3.2 oz (65.9 kg)   SpO2 96%   BMI 23.44 kg/m    Constitutional: well developed, well nourished Head: normocephalic,  atraumatic Eyes: sclera anicteric, no redness Neck: supple Lungs: normal respiratory effort Neurology: alert and oriented Skin: dry, no appreciable rashes Musculoskeletal: no appreciable defects Psychiatric: normal mood and affect Diabetic Foot Exam - Simple   No data filed      Current Medications Patient's Medications  New Prescriptions   DULAGLUTIDE (TRULICITY) 1.5 MG/0.5ML SOAJ    Inject 1.5 mg into the skin once a week.   EMPAGLIFLOZIN (JARDIANCE) 25 MG TABS TABLET    Take 1 tablet (25 mg total) by mouth daily before breakfast.   INSULIN LISPRO (HUMALOG KWIKPEN) 100 UNIT/ML KWIKPEN    151 - 190: 1 unit, 191 - 230: 2 units, 231 - 270: 3 units, 271 - 310: 4 units, 311 - 350: 5 units, 351 - 390: 6 units,  391 - 430: 7 units  Previous Medications   ASPIRIN 81 MG TABLET    Take 81 mg by mouth daily.   ATORVASTATIN (LIPITOR) 20 MG TABLET    TAKE 1 TABLET BY MOUTH EVERY DAY   BLOOD GLUCOSE MONITORING SUPPL (ONETOUCH VERIO) W/DEVICE KIT    Check sugar 3-4 times daily. E11.65   DUTASTERIDE (AVODART) 0.5 MG CAPSULE    Take 0.5 mg by mouth daily.   LANCETS (ONETOUCH ULTRASOFT) LANCETS    Use as instructed.Check sugar 3-4 times daily. E11.65   LEVOTHYROXINE (SYNTHROID) 100 MCG TABLET    TAKE 1 TABLET BY MOUTH EVERY DAY   METFORMIN (GLUCOPHAGE) 1000 MG TABLET    Take 1 tablet (1,000 mg total) by mouth 2 (two) times daily with a meal.   ONETOUCH VERIO TEST STRIP    USE TO TEST 3 TO 4 TIMES DAILY AS DIRECTED   VALACYCLOVIR (VALTREX) 500 MG TABLET    Take 1 tablet (500 mg total) by mouth daily.  Modified Medications   Modified Medication Previous Medication   GLIMEPIRIDE (AMARYL) 1 MG TABLET glimepiride (AMARYL) 1 MG tablet      Take 2 tablets (2 mg total) by mouth 2 (two) times daily.    Take 1 tablet (1 mg total) by mouth 2 (two) times daily.  Discontinued Medications   DULAGLUTIDE (TRULICITY) 4.5 MG/0.5ML SOPN    Inject 4.5 mg as directed once a week.   JARDIANCE 10 MG TABS TABLET    Take  10 mg by mouth every morning.    Allergies No Known Allergies  Past Medical History Past Medical History:  Diagnosis Date   ARTHRITIS, HIP 08/10/2008   BURSITIS, RIGHT SHOULDER 05/20/2008   Degeneration of lumbar or lumbosacral intervertebral disc 11/27/2006   DEGENERATION, CERVICAL DISC 11/27/2006   Degenerative disc disease, lumbar    Depression    no per pt 03-30-19   DEPRESSION 11/25/2006   Diabetes mellitus    DIABETES MELLITUS, TYPE II 04/10/2007   Dyspnea    Epicondylitis    bilateral   Fatigue    HERPES GENITALIS 09/17/2007   Hyperlipemia    HYPERLIPIDEMIA 03/31/2007   Hypertension    HYPERTENSION 11/25/2006   HYPOTHYROIDISM 10/28/2007   Impaired fasting glucose 12/26/2006   Lateral epicondylitis  of elbow 05/05/2007   MRSA infection    Ear   PONV (postoperative nausea and vomiting)    Pulmonary fibrosis (HCC)    Sciatica 10/28/2007   SHOULDER PAIN, RIGHT 08/18/2008    Past Surgical History Past Surgical History:  Procedure Laterality Date   ACHILLES TENDON REPAIR     left   COLONOSCOPY     ELBOW SURGERY  2005   left   herniated lumbar disc  1997, 2011   KNEE ARTHROSCOPY  2007   left   ROTATOR CUFF REPAIR  1997   right    Family History family history includes Diabetes in his mother and sister; Heart disease in his father and mother.  Social History Social History   Socioeconomic History   Marital status: Married    Spouse name: Not on file   Number of children: Not on file   Years of education: Not on file   Highest education level: Not on file  Occupational History   Not on file  Tobacco Use   Smoking status: Never   Smokeless tobacco: Never  Vaping Use   Vaping status: Never Used  Substance and Sexual Activity   Alcohol use: Yes  Alcohol/week: 3.0 standard drinks of alcohol    Types: 3 Cans of beer per week    Comment: occ    Drug use: No   Sexual activity: Yes  Other Topics Concern   Not on file  Social History Narrative    Not on file   Social Determinants of Health   Financial Resource Strain: Not on file  Food Insecurity: Low Risk  (01/01/2023)   Received from [facility]   Hunger Vital Sign    Worried About Running Out of Food in the Last Year: Never true    Ran Out of Food in the Last Year: Never true  Transportation Needs: No Transportation Needs (01/01/2023)   Received from [facility]    In the past 12 months, has lack of reliable transportation kept you from medical appointments, meetings, work or from getting things needed for daily living? : No  Physical Activity: Not on file  Stress: Not on file  Social Connections: Not on file  Intimate Partner Violence: Not on file    Lab Results  Component Value Date   HGBA1C 9.6 (H) 01/31/2023   HGBA1C 7.9 (A) 11/27/2022   HGBA1C 8.6 (H) 08/24/2022   Lab Results  Component Value Date   CHOL 164 08/24/2022   Lab Results  Component Value Date   HDL 59.50 08/24/2022   Lab Results  Component Value Date   LDLCALC 91 08/24/2022   Lab Results  Component Value Date   TRIG 67.0 08/24/2022   Lab Results  Component Value Date   CHOLHDL 3 08/24/2022   Lab Results  Component Value Date   CREATININE 1.19 01/31/2023   Lab Results  Component Value Date   GFR 66.55 08/24/2022   Lab Results  Component Value Date   MICROALBUR <0.7 08/24/2022      Component Value Date/Time   NA 138 01/31/2023 0828   NA 145 (H) 02/05/2018 1342   K 4.5 01/31/2023 0828   CL 103 01/31/2023 0828   CO2 26 01/31/2023 0828   GLUCOSE 186 (H) 01/31/2023 0828   BUN 19 01/31/2023 0828   BUN 13 02/05/2018 1342   CREATININE 1.19 01/31/2023 0828   CALCIUM 9.5 01/31/2023 0828   PROT 6.6 08/24/2022 0812   ALBUMIN 4.1 08/24/2022 0812   AST 12 08/24/2022 0812   ALT 11 08/24/2022 0812   ALKPHOS 34 (L) 08/24/2022 0812   BILITOT 0.7 08/24/2022 0812   GFRNONAA >60 01/31/2023 0828   GFRAA 74 02/05/2018 1342      Latest Ref Rng & Units 01/31/2023     8:28 AM 08/24/2022    8:12 AM 04/27/2022    8:21 AM  BMP  Glucose 70 - 99 mg/dL 132  440  102   BUN 8 - 23 mg/dL 19  18  15    Creatinine 0.61 - 1.24 mg/dL 7.25  3.66  4.40   Sodium 135 - 145 mmol/L 138  137  137   Potassium 3.5 - 5.1 mmol/L 4.5  4.6  4.6   Chloride 98 - 111 mmol/L 103  100  99   CO2 22 - 32 mmol/L 26  31  32   Calcium 8.9 - 10.3 mg/dL 9.5  9.5  9.9        Component Value Date/Time   WBC 7.2 01/31/2023 0828   RBC 4.45 01/31/2023 0828   HGB 13.6 01/31/2023 0828   HCT 41.5 01/31/2023 0828   PLT 210 01/31/2023 0828   MCV  93.3 01/31/2023 0828   MCH 30.6 01/31/2023 0828   MCHC 32.8 01/31/2023 0828   RDW 12.4 01/31/2023 0828   LYMPHSABS 1.5 08/24/2022 0812   MONOABS 0.5 08/24/2022 0812   EOSABS 0.2 08/24/2022 0812   BASOSABS 0.0 08/24/2022 1610     Parts of this note may have been dictated using voice recognition software. There may be variances in spelling and vocabulary which are unintentional. Not all errors are proofread. Please notify the Thereasa Parkin if any discrepancies are noted or if the meaning of any statement is not clear.

## 2023-02-06 ENCOUNTER — Encounter (HOSPITAL_COMMUNITY): Payer: Self-pay

## 2023-02-06 ENCOUNTER — Encounter (HOSPITAL_COMMUNITY): Payer: Self-pay | Admitting: Medical

## 2023-02-06 NOTE — Anesthesia Preprocedure Evaluation (Signed)
Anesthesia Evaluation    Airway        Dental   Pulmonary           Cardiovascular hypertension,      Neuro/Psych    GI/Hepatic   Endo/Other  diabetes    Renal/GU      Musculoskeletal   Abdominal   Peds  Hematology   Anesthesia Other Findings   Reproductive/Obstetrics                              Anesthesia Physical Anesthesia Plan  ASA:   Anesthesia Plan:    Post-op Pain Management:    Induction:   PONV Risk Score and Plan:   Airway Management Planned:   Additional Equipment:   Intra-op Plan:   Post-operative Plan:   Informed Consent:   Plan Discussed with:   Anesthesia Plan Comments: (See PAT note from 12/5 by Sherlie Ban PA-C )         Anesthesia Quick Evaluation

## 2023-02-06 NOTE — Progress Notes (Signed)
Case: [id number] Date/Time: 02/14/23 0715   Procedure: OPEN LEFT INGUINAL HERNIA REPAIR WITH MESH (Left)   Anesthesia type: General   Pre-op diagnosis: LEFT INGUINAL HERNIA   Location: [facility] ROOM 01 / [facility]   Surgeons: Gaynelle Adu, MD       DISCUSSION: Ricardo [name] is a 72 yo male who presents to PAT prior to surgery above. PMH of HTN, HLD, pulmonary fibrosis, IDDM, hypothyroidism.  Prior anesthesia complications include PONV  Patient previously followed with Pulmonology for ILD. Last seen in 2020. He underwent high res Chest CT in 04/2018 that showed fibrosis was stable. Patient declined ongoing pulmonology f/u.  He does follow with PCP and endocrinology for his IDDM which is uncontrolled. HgA1c was 9.6 on PAT labs. Per Dr. Andrey Campanile, ok to proceed. Patient has f/u with his endocrinologist on 12/10 and had medications adjusted.  VS: BP (!) 153/87   Pulse 68   Temp 36.8 C (Oral)   Resp 16   Ht 5\' 6"  (1.676 m)   Wt 66 kg   SpO2 96%   BMI 23.50 kg/m   PROVIDERS: PCP: [name], Georgina Quint, MD Endocrine: Altamese Meadowbrook, MD  LABS: Labs reviewed: Acceptable for surgery. (all labs ordered are listed, but only abnormal results are displayed)  Labs Reviewed  SURGICAL PCR SCREEN - Abnormal; Notable for the following components:      Result Value   Staphylococcus aureus POSITIVE (*)    All other components within normal limits  HEMOGLOBIN A1C - Abnormal; Notable for the following components:   Hgb A1c MFr Bld 9.6 (*)    All other components within normal limits  BASIC METABOLIC PANEL - Abnormal; Notable for the following components:   Glucose, Bld 186 (*)    All other components within normal limits  GLUCOSE, CAPILLARY - Abnormal; Notable for the following components:   Glucose-Capillary 195 (*)    All other components within normal limits  CBC     IMAGES:   EKG 01/31/23:  Normal sinus rhythm, rate 64 Nonspecific T wave abnormality Abnormal ECG When compared with ECG  of 02-Apr-2007 06:54, No significant change was found Confirmed by Lorine Bears   CV:  Cardiac CT 02/18/2018:  IMPRESSION: 1. Coronary calcium score of 17.3. This was 30th percentile for age and sex matched control.   2. Normal coronary origin with right dominance.   3. Minimal calcified plaque in the proximal LAD and RCA.   Past Medical History:  Diagnosis Date   ARTHRITIS, HIP 08/10/2008   BURSITIS, RIGHT SHOULDER 05/20/2008   Degeneration of lumbar or lumbosacral intervertebral disc 11/27/2006   DEGENERATION, CERVICAL DISC 11/27/2006   Depression    no per pt 03-30-19   DIABETES MELLITUS, TYPE II 04/10/2007   Dyspnea    Epicondylitis    bilateral   Fatigue    HERPES GENITALIS 09/17/2007   Hyperlipemia    Hypertension    HYPOTHYROIDISM 10/28/2007   Lateral epicondylitis  of elbow 05/05/2007   MRSA infection    Ear   PONV (postoperative nausea and vomiting)    Pulmonary fibrosis (HCC)    Sciatica 10/28/2007   SHOULDER PAIN, RIGHT 08/18/2008    Past Surgical History:  Procedure Laterality Date   ACHILLES TENDON REPAIR     left   COLONOSCOPY     ELBOW SURGERY  2005   left   herniated lumbar disc  1997, 2011   KNEE ARTHROSCOPY  2007   left   ROTATOR CUFF REPAIR  1997  right    MEDICATIONS:  aspirin 81 MG tablet   atorvastatin (LIPITOR) 20 MG tablet   Blood Glucose Monitoring Suppl (ONETOUCH VERIO) w/Device KIT   Dulaglutide (TRULICITY) 1.5 MG/0.5ML SOAJ   dutasteride (AVODART) 0.5 MG capsule   empagliflozin (JARDIANCE) 25 MG TABS tablet   glimepiride (AMARYL) 1 MG tablet   insulin lispro (HUMALOG KWIKPEN) 100 UNIT/ML KwikPen   Lancets (ONETOUCH ULTRASOFT) lancets   levothyroxine (SYNTHROID) 100 MCG tablet   metFORMIN (GLUCOPHAGE) 1000 MG tablet   ONETOUCH VERIO test strip   valACYclovir (VALTREX) 500 MG tablet   No current facility-administered medications for this encounter.   Marcille Blanco [facility] Short  Stay/Anesthesiology [facility] Phone [phone] 02/06/2023 2:57 PM

## 2023-02-06 NOTE — Telephone Encounter (Signed)
Noted.  Please see me or one of my partners this or next week.  Thank you

## 2023-02-08 NOTE — Telephone Encounter (Signed)
Pt is asking if he should still continue with the surgery with his A1c being at a 8 ? He is just asking for PCP recommendation as the surgeon is willing to do it but he just wants to know if you agree with this or not being his levels is higher than an 8?

## 2023-02-10 NOTE — Telephone Encounter (Signed)
Yes, his hemoglobin A1c is much worse.  Please reschedule surgery and the see me for the appointment.  He needs to bring all his meds with him to the appointment.  Thank you

## 2023-02-12 NOTE — Telephone Encounter (Signed)
Pt states he has seen his ENDO doctor and the were able to adjust his medications.Pt has stated the surgery has been canceled as well until he can bring his numbers down to an 8 or less.

## 2023-02-13 NOTE — Telephone Encounter (Signed)
Noted! Thank you

## 2023-02-14 ENCOUNTER — Ambulatory Visit (HOSPITAL_COMMUNITY): Admission: RE | Admit: 2023-02-14 | Payer: 59 | Source: Ambulatory Visit | Admitting: General Surgery

## 2023-02-14 ENCOUNTER — Encounter (HOSPITAL_COMMUNITY): Admission: RE | Payer: Self-pay | Source: Ambulatory Visit

## 2023-02-14 SURGERY — HERNIA REPAIR INGUINAL ADULT
Anesthesia: General | Laterality: Left

## 2023-02-28 ENCOUNTER — Ambulatory Visit: Payer: 59 | Admitting: "Endocrinology

## 2023-03-15 ENCOUNTER — Other Ambulatory Visit: Payer: Self-pay

## 2023-03-15 ENCOUNTER — Telehealth: Payer: Self-pay

## 2023-03-15 NOTE — Telephone Encounter (Signed)
metformin 1 g twice a day, glimepiride 1 mg up to 2 pills twice a day, jardiance 25 mg every day , Trulicity 1.5 mg/week  Humalog based on sliding scale   Pt called and stated that his insurance willn't cover this medication the way that it written , per your note you want him to glimpride 1 mg bid ,and on his rx it stated that he is to take 2 tablets bid.  How is pt suppose to take medication

## 2023-03-15 NOTE — Telephone Encounter (Signed)
Pt called and stated that he needed refills sent to pharmacy for his Jardiance and Glimepride , informed patient that he has refill on this medication that this was sent to [facility] on 02/04/23 for 90 day with refills , he will just need to call the pharmacy to request a refill. Pt said that he call the pharmacy and was told that he didn't have refills that he will need to call the office. He said that pharmacy had contact to let him know that his medication was ready to pick up ,pt said that he didn't go to pick this up.I explain the pt that he needs to call the pharmacy to refill medication , b/c they already have his medication. Pt stated that he would call them back. Called [facility] to confirm that patient had refill I was told pt did have refills. Pharmacist stated that she will fill meds for patient and contact patient.

## 2023-03-21 ENCOUNTER — Encounter: Payer: Self-pay | Admitting: Internal Medicine

## 2023-03-21 MED ORDER — GLIMEPIRIDE 1 MG PO TABS: 2.0000 mg | ORAL_TABLET | Freq: Two times a day (BID) | ORAL | 2 refills | Status: DC

## 2023-03-23 ENCOUNTER — Other Ambulatory Visit: Payer: Self-pay | Admitting: Internal Medicine

## 2023-03-23 MED ORDER — FREESTYLE LIBRE 3 READER DEVI: 1 refills | Status: AC

## 2023-03-23 MED ORDER — FREESTYLE LIBRE 3 PLUS SENSOR MISC: 11 refills | Status: AC

## 2023-03-25 ENCOUNTER — Telehealth: Payer: Self-pay | Admitting: Pharmacy Technician

## 2023-03-25 ENCOUNTER — Other Ambulatory Visit (HOSPITAL_COMMUNITY): Payer: Self-pay

## 2023-03-25 NOTE — Telephone Encounter (Signed)
Pharmacy Patient Advocate Encounter  Received notification from [facility] [employer] that Prior Authorization for FreeStyle Libre 3 Plus Sensor has been CANCELLED due to No PA needed. Called Pharmacy and they received a paid claim.   PA #/Case ID/Reference #: [id number]

## 2023-03-26 ENCOUNTER — Other Ambulatory Visit (HOSPITAL_COMMUNITY): Payer: Self-pay

## 2023-03-27 NOTE — Progress Notes (Unsigned)
[name] [address] [place] [address] Phone: [phone] Subjective:   Bruce Donath, am serving as a scribe for Dr. Antoine Primas.  I'm seeing this patient by the request  of:  [name], Georgina Quint, MD  CC: Knee and thumb pain follow-up  [id number]  12/27/2022 Patient has done well with the injection at this time.  Nearly 6 months at this time.  Will hold on any other injection with patient doing well and follow-up with me again in 3 to 6 months    Update 02/26/2023 Ricardo [name] is a 73 y.o. male coming in with complaint of R knee pain. Patient states that he has been doing ok. Last week more pain than this week.   Also having pain in B CMC joint pain. Painful to grab objects with L hand. R hand pain has dissipated.      Past Medical History:  Diagnosis Date   ARTHRITIS, HIP 08/10/2008   BURSITIS, RIGHT SHOULDER 05/20/2008   Degeneration of lumbar or lumbosacral intervertebral disc 11/27/2006   DEGENERATION, CERVICAL DISC 11/27/2006   Depression    no per pt 03-30-19   DIABETES MELLITUS, TYPE II 04/10/2007   Dyspnea    Epicondylitis    bilateral   Fatigue    HERPES GENITALIS 09/17/2007   Hyperlipemia    Hypertension    HYPOTHYROIDISM 10/28/2007   Lateral epicondylitis  of elbow 05/05/2007   MRSA infection    Ear   PONV (postoperative nausea and vomiting)    Pulmonary fibrosis (HCC)    Sciatica 10/28/2007   SHOULDER PAIN, RIGHT 08/18/2008   Past Surgical History:  Procedure Laterality Date   ACHILLES TENDON REPAIR     left   COLONOSCOPY     ELBOW SURGERY  2005   left   herniated lumbar disc  1997, 2011   KNEE ARTHROSCOPY  2007   left   ROTATOR CUFF REPAIR  1997   right   Social History   Socioeconomic History   Marital status: Married    Spouse name: Not on file   Number of children: Not on file   Years of education: Not on file   Highest education level: Not on file  Occupational History   Not  on file  Tobacco Use   Smoking status: Never   Smokeless tobacco: Never  Vaping Use   Vaping status: Never Used  Substance and Sexual Activity   Alcohol use: Yes    Alcohol/week: 3.0 standard drinks of alcohol    Types: 3 Cans of beer per week    Comment: occ    Drug use: No   Sexual activity: Yes  Other Topics Concern   Not on file  Social History Narrative   Not on file   Social Drivers of Health   Financial Resource Strain: Not on file  Food Insecurity: Low Risk  (01/01/2023)   Received from [facility]   Hunger Vital Sign    Worried About Running Out of Food in the Last Year: Never true    Ran Out of Food in the Last Year: Never true  Transportation Needs: No Transportation Needs (01/01/2023)   Received from [facility]    In the past 12 months, has lack of reliable transportation kept you from medical appointments, meetings, work or from getting things needed for daily living? : No  Physical Activity: Not on file  Stress: Not on file  Social Connections: Not on  file   No Known Allergies Family History  Problem Relation Age of Onset   Diabetes Mother    Heart disease Mother    Heart disease Father    Diabetes Sister    Colon cancer Neg Hx    Esophageal cancer Neg Hx    Rectal cancer Neg Hx    Stomach cancer Neg Hx     Current Outpatient Medications (Endocrine & Metabolic):    Dulaglutide (TRULICITY) 1.5 MG/0.5ML SOAJ, Inject 1.5 mg into the skin once a week.   empagliflozin (JARDIANCE) 25 MG TABS tablet, Take 1 tablet (25 mg total) by mouth daily before breakfast.   glimepiride (AMARYL) 1 MG tablet, Take 2 tablets (2 mg total) by mouth 2 (two) times daily.   insulin lispro (HUMALOG KWIKPEN) 100 UNIT/ML KwikPen, 151 - 190: 1 unit, 191 - 230: 2 units, 231 - 270: 3 units, 271 - 310: 4 units, 311 - 350: 5 units, 351 - 390: 6 units,  391 - 430: 7 units   levothyroxine (SYNTHROID) 100 MCG tablet, TAKE 1 TABLET BY MOUTH EVERY DAY   metFORMIN  (GLUCOPHAGE) 1000 MG tablet, Take 1 tablet (1,000 mg total) by mouth 2 (two) times daily with a meal.  Current Outpatient Medications (Cardiovascular):    atorvastatin (LIPITOR) 20 MG tablet, TAKE 1 TABLET BY MOUTH EVERY DAY   Current Outpatient Medications (Analgesics):    aspirin 81 MG tablet, Take 81 mg by mouth daily.   Current Outpatient Medications (Other):    Blood Glucose Monitoring Suppl (ONETOUCH VERIO) w/Device KIT, Check sugar 3-4 times daily. E11.65   Continuous Glucose Receiver (FREESTYLE LIBRE 3 READER) DEVI, As directed   Continuous Glucose Sensor (FREESTYLE LIBRE 3 PLUS SENSOR) MISC, Change sensor every 15 days.   dutasteride (AVODART) 0.5 MG capsule, Take 0.5 mg by mouth daily.   Lancets (ONETOUCH ULTRASOFT) lancets, Use as instructed.Check sugar 3-4 times daily. E11.65   ONETOUCH VERIO test strip, USE TO TEST 3 TO 4 TIMES DAILY AS DIRECTED   valACYclovir (VALTREX) 500 MG tablet, Take 1 tablet (500 mg total) by mouth daily.   Reviewed prior external information including notes and imaging from  primary care provider As well as notes that were available from care everywhere and other healthcare systems.  Past medical history, social, surgical and family history all reviewed in electronic medical record.  No pertanent information unless stated regarding to the chief complaint.   Review of Systems:  No headache, visual changes, nausea, vomiting, diarrhea, constipation, dizziness, abdominal pain, skin rash, fevers, chills, night sweats, weight loss, swollen lymph nodes, body aches, joint swelling, chest pain, shortness of breath, mood changes. POSITIVE muscle aches  Objective  Blood pressure 124/88, pulse 60, height 5\' 6"  (1.676 m), weight 148 lb (67.1 kg), SpO2 98%.   General: No apparent distress alert and oriented x3 mood and affect normal, dressed appropriately.  HEENT: Pupils equal, extraocular movements intact  Respiratory: Patient's speak in full sentences and  does not appear short of breath  Cardiovascular: No lower extremity edema, non tender, no erythema  Right knee exam does show some arthritic changes noted.  Positive McMurray's noted.  Lacks last 5 degrees of extension noted.  Left thumb does have a positive grind test noted.  Atrophy noted.  Limited muscular skeletal ultrasound was performed and interpreted by Antoine Primas, M  Limited ultrasound of patient's right knee does have some hypoechoic changes in the patellofemoral joint patient does have an acute on chronic tear noted of the medial  meniscus and what appears to be an acute tear of the lateral meniscus. 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 Right knee was prepped with alcohol swab and utilizing anterolateral approach, patient's right knee space was injected with 4:1  marcaine 0.5%: Kenalog 40mg /dL. Patient tolerated the procedure well without immediate complications.   Impression and Recommendations:    The above documentation has been reviewed and is accurate and complete Judi Saa, DO

## 2023-03-28 ENCOUNTER — Ambulatory Visit: Payer: HMO | Admitting: Family Medicine

## 2023-03-28 ENCOUNTER — Encounter: Payer: Self-pay | Admitting: Family Medicine

## 2023-03-28 ENCOUNTER — Ambulatory Visit (INDEPENDENT_AMBULATORY_CARE_PROVIDER_SITE_OTHER): Payer: 59

## 2023-03-28 ENCOUNTER — Other Ambulatory Visit: Payer: Self-pay

## 2023-03-28 VITALS — BP 124/88 | HR 60 | Ht 66.0 in | Wt 148.0 lb

## 2023-03-28 DIAGNOSIS — M25561 Pain in right knee: Secondary | ICD-10-CM

## 2023-03-28 DIAGNOSIS — G8929 Other chronic pain: Secondary | ICD-10-CM | POA: Diagnosis not present

## 2023-03-28 DIAGNOSIS — M1812 Unilateral primary osteoarthritis of first carpometacarpal joint, left hand: Secondary | ICD-10-CM | POA: Diagnosis not present

## 2023-03-28 DIAGNOSIS — S83241A Other tear of medial meniscus, current injury, right knee, initial encounter: Secondary | ICD-10-CM

## 2023-03-28 DIAGNOSIS — M79645 Pain in left finger(s): Secondary | ICD-10-CM | POA: Diagnosis not present

## 2023-03-28 NOTE — Patient Instructions (Signed)
Thumb spica Exercises See me in 2 months Xray on way out

## 2023-03-28 NOTE — Assessment & Plan Note (Signed)
Ultrasound shows significant CMC arthritis noted.  Discussed with patient about ground bracing at the moment and topical anti-inflammatories.  Worsening symptoms consider the possibility and injections.  Injection

## 2023-03-28 NOTE — Assessment & Plan Note (Addendum)
Appears to be acute on chronic tear noted.  Given injection back in April.  Is making improvement but I do think that it is going to be slow.  Seems to be an medial and lateral meniscal tear that we need to monitor.  Does have some underlying arthritic changes that could be contributing as well and would like to repeat x-rays to further evaluate.  Depending on how patient responds we will either consider the possibility of injections, formal physical therapy again, or the possibility of advanced imaging.  Patient is in agreement with the plan and will follow-up again in 2 to 3 months  Before patient left knee did change her mind and decided to have an injection.  Patient was given an injection in the knee the other conservative therapy measures

## 2023-04-02 DIAGNOSIS — H31023 Solar retinopathy, bilateral: Secondary | ICD-10-CM | POA: Diagnosis not present

## 2023-04-02 DIAGNOSIS — H524 Presbyopia: Secondary | ICD-10-CM | POA: Diagnosis not present

## 2023-04-02 DIAGNOSIS — E113291 Type 2 diabetes mellitus with mild nonproliferative diabetic retinopathy without macular edema, right eye: Secondary | ICD-10-CM | POA: Diagnosis not present

## 2023-04-02 DIAGNOSIS — H25813 Combined forms of age-related cataract, bilateral: Secondary | ICD-10-CM | POA: Diagnosis not present

## 2023-04-02 DIAGNOSIS — H40033 Anatomical narrow angle, bilateral: Secondary | ICD-10-CM | POA: Diagnosis not present

## 2023-04-02 LAB — HM DIABETES EYE EXAM

## 2023-04-05 ENCOUNTER — Encounter: Payer: Self-pay | Admitting: "Endocrinology

## 2023-04-05 ENCOUNTER — Ambulatory Visit: Payer: 59 | Admitting: "Endocrinology

## 2023-04-05 ENCOUNTER — Ambulatory Visit (INDEPENDENT_AMBULATORY_CARE_PROVIDER_SITE_OTHER): Payer: HMO | Admitting: "Endocrinology

## 2023-04-05 ENCOUNTER — Other Ambulatory Visit: Payer: 59

## 2023-04-05 DIAGNOSIS — Z7984 Long term (current) use of oral hypoglycemic drugs: Secondary | ICD-10-CM | POA: Diagnosis not present

## 2023-04-05 DIAGNOSIS — Z7985 Long-term (current) use of injectable non-insulin antidiabetic drugs: Secondary | ICD-10-CM | POA: Diagnosis not present

## 2023-04-05 DIAGNOSIS — E1165 Type 2 diabetes mellitus with hyperglycemia: Secondary | ICD-10-CM

## 2023-04-05 DIAGNOSIS — E7800 Pure hypercholesterolemia, unspecified: Secondary | ICD-10-CM | POA: Diagnosis not present

## 2023-04-05 LAB — POCT GLYCOSYLATED HEMOGLOBIN (HGB A1C): Hemoglobin A1C: 7.9 % — AB (ref 4.0–5.6)

## 2023-04-05 NOTE — Addendum Note (Signed)
 Addended by: Tara Fanti on: 04/05/2023 09:57 AM   Modules accepted: Orders

## 2023-04-05 NOTE — Patient Instructions (Signed)

## 2023-04-05 NOTE — Addendum Note (Signed)
 Addended by: Tara Fanti on: 04/05/2023 10:57 AM   Modules accepted: Orders

## 2023-04-05 NOTE — Progress Notes (Addendum)
 Outpatient Endocrinology Note Ricardo Birmingham, MD  04/05/23   Ricardo [name] Dec 10, 1950 [id number]  Referring Provider: Garald [name], MD Primary Care Provider: Garald [name], MD Reason for consultation: Subjective   Assessment & Plan  Diagnoses and all orders for this visit:  Uncontrolled type 2 diabetes mellitus with hyperglycemia, without long-term current use of insulin  (HCC) -     Cancel: POCT glycosylated hemoglobin (Hb A1C) -     POCT glycosylated hemoglobin (Hb A1C)  Long term (current) use of oral hypoglycemic drugs  Long-term (current) use of injectable non-insulin  antidiabetic drugs  Pure hypercholesterolemia   Diabetes Type II complicated by neuropathy,  Lab Results  Component Value Date   GFR 66.55 08/24/2022   Hba1c goal less than 7, current Hba1c is  Lab Results  Component Value Date   HGBA1C 7.9 (A) 04/05/2023   Will recommend the following: metformin  1 g twice a day, glimepiride  1 mg up to 2 pills twice a day, jardiance  25 mg every day  Trulicity  1.5 mg weekly (max tolerated dose) Has CGM, will put it on today and bring log next time  Did not start Humalog   Was prescribed humalog  based on sliding scale  Correction scale:   151 - 190: 1 unit 191 - 230: 2 units 231 - 270: 3 units 271 - 310: 4 units 311 - 350: 5 units 351 - 390: 6 units 391 - 430: 7 units   Self stopped Trulicity  3 mg weekly due to nausea, mounjaro  not covered   No known contraindications/side effects to any of above medications  -Last LD and Tg are as follows: Lab Results  Component Value Date   LDLCALC 91 08/24/2022    Lab Results  Component Value Date   TRIG 67.0 08/24/2022   -On atorvastatin  20 mg QD -Follow low fat diet and exercise   -Blood pressure goal <140/90 - Microalbumin/creatinine goal is < 30 -Last MA/Cr is as follows: Lab Results  Component Value Date   MICROALBUR <0.7 08/24/2022   -not on ACE/ARB  -diet changes  including salt restriction -limit eating outside -counseled BP targets per standards of diabetes care -uncontrolled blood pressure can lead to retinopathy, nephropathy and cardiovascular and atherosclerotic heart disease  Reviewed and counseled on: -A1C target -Blood sugar targets -Complications of uncontrolled diabetes  -Checking blood sugar before meals and bedtime and bring log next visit -All medications with mechanism of action and side effects -Hypoglycemia management: rule of 15's, Glucagon Emergency Kit and medical alert ID -low-carb low-fat plate-method diet -At least 20 minutes of physical activity per day -Annual dilated retinal eye exam and foot exam -compliance and follow up needs -follow up as scheduled or earlier if problem gets worse  Call if blood sugar is less than 70 or consistently above 250    Take a 15 gm snack of carbohydrate at bedtime before you go to sleep if your blood sugar is less than 100.    If you are going to fast after midnight for a test or procedure, ask your physician for instructions on how to reduce/decrease your insulin  dose.    Call if blood sugar is less than 70 or consistently above 250  -Treating a low sugar by rule of 15  (15 gms of sugar every 15 min until sugar is more than 70) If you feel your sugar is low, test your sugar to be sure If your sugar is low (less than 70), then take 15 grams of a fast  acting Carbohydrate (3-4 glucose tablets or glucose gel or 4 ounces of juice or regular soda) Recheck your sugar 15 min after treating low to make sure it is more than 70 If sugar is still less than 70, treat again with 15 grams of carbohydrate          Don't drive the hour of hypoglycemia  If unconscious/unable to eat or drink by mouth, use glucagon injection or nasal spray baqsimi and call 911. Can repeat again in 15 min if still unconscious.  Return in about 11 days (around 04/16/2023) for visit at 8:20.   I have reviewed current  medications, nurse's notes, allergies, vital signs, past medical and surgical history, family medical history, and social history for this encounter. Counseled patient on symptoms, examination findings, lab findings, imaging results, treatment decisions and monitoring and prognosis. The patient understood the recommendations and agrees with the treatment plan. All questions regarding treatment plan were fully answered.  Ricardo Birmingham, MD  04/05/23   History of Present Illness Ricardo [name] is a 73 y.o. year old male who presents for evaluation of Type II diabetes mellitus.  Ricardo [name] was first diagnosed in 2009.   Diabetes education +  Home diabetes regimen: Trulicity  3 mg weekly, metformin  1 g twice a day, glimepiride  1 mg every day, jardiance  10 mg every day   Past history: At diagnosis he was having symptoms of fatigue.  He was started on glipizide  initially and subsequently metformin  was added.  Further details are not available However he has been somewhat irregular with his follow-up and did not have an A1c with his PCP between 8/13 and 11/15 He was referred here because of rising A1c of 7.5 He had been started on Trulicity  in 4/17 because of gradually increasing A1c He stopped taking Trulicity  in 2018 because of out-of-pocket expense glipizide  ER 2.5 mg  COMPLICATIONS -  MI/Stroke -  retinopathy +  neuropathy -  nephropathy  BLOOD SUGAR DATA Average 174, range 94-264   Physical Exam  BP 132/80 (BP Location: Left Arm, Patient Position: Sitting, Cuff Size: Normal)   Pulse 65   Resp 20   Ht 5' 6 (1.676 m)   Wt 147 lb 12.8 oz (67 kg)   SpO2 95%   BMI 23.86 kg/m    Constitutional: well developed, well nourished Head: normocephalic, atraumatic Eyes: sclera anicteric, no redness Neck: supple Lungs: normal respiratory effort Neurology: alert and oriented Skin: dry, no appreciable rashes Musculoskeletal: no appreciable defects Psychiatric: normal mood and  affect Diabetic Foot Exam - Simple   No data filed      Current Medications Patient's Medications  New Prescriptions   No medications on file  Previous Medications   ASPIRIN 81 MG TABLET    Take 81 mg by mouth daily.   ATORVASTATIN  (LIPITOR) 20 MG TABLET    TAKE 1 TABLET BY MOUTH EVERY DAY   BLOOD GLUCOSE MONITORING SUPPL (ONETOUCH VERIO) W/DEVICE KIT    Check sugar 3-4 times daily. E11.65   CONTINUOUS GLUCOSE RECEIVER (FREESTYLE LIBRE 3 READER) DEVI    As directed   CONTINUOUS GLUCOSE SENSOR (FREESTYLE LIBRE 3 PLUS SENSOR) MISC    Change sensor every 15 days.   DULAGLUTIDE  (TRULICITY ) 1.5 MG/0.5ML SOAJ    Inject 1.5 mg into the skin once a week.   DUTASTERIDE (AVODART) 0.5 MG CAPSULE    Take 0.5 mg by mouth daily.   EMPAGLIFLOZIN  (JARDIANCE ) 25 MG TABS TABLET    Take 1 tablet (25  mg total) by mouth daily before breakfast.   GLIMEPIRIDE  (AMARYL ) 1 MG TABLET    Take 2 tablets (2 mg total) by mouth 2 (two) times daily.   INSULIN  LISPRO (HUMALOG  KWIKPEN) 100 UNIT/ML KWIKPEN    151 - 190: 1 unit, 191 - 230: 2 units, 231 - 270: 3 units, 271 - 310: 4 units, 311 - 350: 5 units, 351 - 390: 6 units,  391 - 430: 7 units   LANCETS (ONETOUCH ULTRASOFT) LANCETS    Use as instructed.Check sugar 3-4 times daily. E11.65   LEVOTHYROXINE  (SYNTHROID ) 100 MCG TABLET    TAKE 1 TABLET BY MOUTH EVERY DAY   METFORMIN  (GLUCOPHAGE ) 1000 MG TABLET    Take 1 tablet (1,000 mg total) by mouth 2 (two) times daily with a meal.   ONETOUCH VERIO TEST STRIP    USE TO TEST 3 TO 4 TIMES DAILY AS DIRECTED   VALACYCLOVIR  (VALTREX ) 500 MG TABLET    Take 1 tablet (500 mg total) by mouth daily.  Modified Medications   No medications on file  Discontinued Medications   No medications on file    Allergies No Known Allergies  Past Medical History Past Medical History:  Diagnosis Date   ARTHRITIS, HIP 08/10/2008   BURSITIS, RIGHT SHOULDER 05/20/2008   Degeneration of lumbar or lumbosacral intervertebral disc 11/27/2006    DEGENERATION, CERVICAL DISC 11/27/2006   Depression    no per pt 03-30-19   DIABETES MELLITUS, TYPE II 04/10/2007   Dyspnea    Epicondylitis    bilateral   Fatigue    HERPES GENITALIS 09/17/2007   Hyperlipemia    Hypertension    HYPOTHYROIDISM 10/28/2007   Lateral epicondylitis  of elbow 05/05/2007   MRSA infection    Ear   PONV (postoperative nausea and vomiting)    Pulmonary fibrosis (HCC)    Sciatica 10/28/2007   SHOULDER PAIN, RIGHT 08/18/2008    Past Surgical History Past Surgical History:  Procedure Laterality Date   ACHILLES TENDON REPAIR     left   COLONOSCOPY     ELBOW SURGERY  2005   left   herniated lumbar disc  1997, 2011   KNEE ARTHROSCOPY  2007   left   ROTATOR CUFF REPAIR  1997   right    Family History family history includes Diabetes in his mother and sister; Heart disease in his father and mother.  Social History Social History   Socioeconomic History   Marital status: Married    Spouse name: Not on file   Number of children: Not on file   Years of education: Not on file   Highest education level: Not on file  Occupational History   Not on file  Tobacco Use   Smoking status: Never   Smokeless tobacco: Never  Vaping Use   Vaping status: Never Used  Substance and Sexual Activity   Alcohol use: Yes    Alcohol/week: 3.0 standard drinks of alcohol    Types: 3 Cans of beer per week    Comment: occ    Drug use: No   Sexual activity: Yes  Other Topics Concern   Not on file  Social History Narrative   Not on file   Social Drivers of Health   Financial Resource Strain: Not on file  Food Insecurity: Low Risk  (01/01/2023)   Received from [facility]   Hunger Vital Sign    Worried About Running Out of Food in the Last Year: Never true  Ran Out of Food in the Last Year: Never true  Transportation Needs: No Transportation Needs (01/01/2023)   Received from [facility]   Transportation    In the past 12 months, has lack of reliable  transportation kept you from medical appointments, meetings, work or from getting things needed for daily living? : No  Physical Activity: Not on file  Stress: Not on file  Social Connections: Not on file  Intimate Partner Violence: Not on file    Lab Results  Component Value Date   HGBA1C 7.9 (A) 04/05/2023   HGBA1C 9.6 (H) 01/31/2023   HGBA1C 7.9 (A) 11/27/2022   Lab Results  Component Value Date   CHOL 164 08/24/2022   Lab Results  Component Value Date   HDL 59.50 08/24/2022   Lab Results  Component Value Date   LDLCALC 91 08/24/2022   Lab Results  Component Value Date   TRIG 67.0 08/24/2022   Lab Results  Component Value Date   CHOLHDL 3 08/24/2022   Lab Results  Component Value Date   CREATININE 1.19 01/31/2023   Lab Results  Component Value Date   GFR 66.55 08/24/2022   Lab Results  Component Value Date   MICROALBUR <0.7 08/24/2022      Component Value Date/Time   NA 138 01/31/2023 0828   NA 145 (H) 02/05/2018 1342   K 4.5 01/31/2023 0828   CL 103 01/31/2023 0828   CO2 26 01/31/2023 0828   GLUCOSE 186 (H) 01/31/2023 0828   BUN 19 01/31/2023 0828   BUN 13 02/05/2018 1342   CREATININE 1.19 01/31/2023 0828   CALCIUM  9.5 01/31/2023 0828   PROT 6.6 08/24/2022 0812   ALBUMIN 4.1 08/24/2022 0812   AST 12 08/24/2022 0812   ALT 11 08/24/2022 0812   ALKPHOS 34 (L) 08/24/2022 0812   BILITOT 0.7 08/24/2022 0812   GFRNONAA >60 01/31/2023 0828   GFRAA 74 02/05/2018 1342      Latest Ref Rng & Units 01/31/2023    8:28 AM 08/24/2022    8:12 AM 04/27/2022    8:21 AM  BMP  Glucose 70 - 99 mg/dL 813  844  826   BUN 8 - 23 mg/dL 19  18  15    Creatinine 0.61 - 1.24 mg/dL 8.80  8.87  8.78   Sodium 135 - 145 mmol/L 138  137  137   Potassium 3.5 - 5.1 mmol/L 4.5  4.6  4.6   Chloride 98 - 111 mmol/L 103  100  99   CO2 22 - 32 mmol/L 26  31  32   Calcium  8.9 - 10.3 mg/dL 9.5  9.5  9.9        Component Value Date/Time   WBC 7.2 01/31/2023 0828   RBC 4.45  01/31/2023 0828   HGB 13.6 01/31/2023 0828   HCT 41.5 01/31/2023 0828   PLT 210 01/31/2023 0828   MCV 93.3 01/31/2023 0828   MCH 30.6 01/31/2023 0828   MCHC 32.8 01/31/2023 0828   RDW 12.4 01/31/2023 0828   LYMPHSABS 1.5 08/24/2022 0812   MONOABS 0.5 08/24/2022 0812   EOSABS 0.2 08/24/2022 0812   BASOSABS 0.0 08/24/2022 0812     Parts of this note may have been dictated using voice recognition software. There may be variances in spelling and vocabulary which are unintentional. Not all errors are proofread. Please notify the dino if any discrepancies are noted or if the meaning of any statement is not clear.

## 2023-04-07 ENCOUNTER — Encounter: Payer: Self-pay | Admitting: Family Medicine

## 2023-04-16 ENCOUNTER — Ambulatory Visit: Payer: 59 | Admitting: "Endocrinology

## 2023-04-20 DIAGNOSIS — M545 Low back pain, unspecified: Secondary | ICD-10-CM | POA: Diagnosis not present

## 2023-04-20 DIAGNOSIS — M62830 Muscle spasm of back: Secondary | ICD-10-CM | POA: Diagnosis not present

## 2023-04-22 ENCOUNTER — Ambulatory Visit: Payer: Self-pay | Admitting: General Surgery

## 2023-04-22 NOTE — Progress Notes (Signed)
 Sent message, via epic in basket, requesting orders in epic from Careers adviser.

## 2023-04-23 NOTE — Patient Instructions (Signed)
 SURGICAL WAITING ROOM VISITATION  Patients having surgery or a procedure may have no more than 2 support people in the waiting area - these visitors may rotate.    Children under the age of 66 must have an adult with them who is not the patient.  Due to an increase in RSV and influenza rates and associated hospitalizations, children ages 61 and under may not visit patients in [facility] hospitals.  Visitors with respiratory illnesses are discouraged from visiting and should remain at home.  If the patient needs to stay at the hospital during part of their recovery, the visitor guidelines for inpatient rooms apply. Pre-op nurse will coordinate an appropriate time for 1 support person to accompany patient in pre-op.  This support person may not rotate.    Please refer to the [facility] website for the visitor guidelines for Inpatients (after your surgery is over and you are in a regular room).       Your procedure is scheduled on:  05/06/2023    Report to [facility] Main Entrance    Report to admitting at   [phone]   Call this number if you have problems the morning of surgery [phone]   Do not eat food :After Midnight.   After Midnight you may have the following liquids until __ 0430____ AM  DAY OF SURGERY  Water Non-Citrus Juices (without pulp, NO RED-Apple, White grape, White cranberry) Black Coffee (NO MILK/CREAM OR CREAMERS, sugar ok)  Clear Tea (NO MILK/CREAM OR CREAMERS, sugar ok) regular and decaf                             Plain Jell-O (NO RED)                                           Fruit ices (not with fruit pulp, NO RED)                                     Popsicles (NO RED)                                                               Sports drinks like Gatorade (NO RED)                    The day of surgery:  Drink ONE (1) Pre-Surgery Clear Ensure or G2 at  0430 AM ( have completed by )  the morning of surgery. Drink in one sitting. Do not sip.   This drink was given to you during your hospital  pre-op appointment visit. Nothing else to drink after completing the  Pre-Surgery Clear Ensure or G2.          If you have questions, please contact your surgeon's office.      Oral Hygiene is also important to reduce your risk of infection.  Remember - BRUSH YOUR TEETH THE MORNING OF SURGERY WITH YOUR REGULAR TOOTHPASTE  DENTURES WILL BE REMOVED PRIOR TO SURGERY PLEASE DO NOT APPLY "Poly grip" OR ADHESIVES!!!   Do NOT smoke after Midnight   Stop all vitamins and herbal supplements 7 days before surgery.   Take these medicines the morning of surgery with A SIP OF WATER:  synthroid,        Jardiance- Hold for 72 hours prior to procedure.  Last dose on         Amaryl- none am of procedure       Metformin- none day of procedure   DO NOT TAKE ANY ORAL DIABETIC MEDICATIONS DAY OF YOUR SURGERY  Bring CPAP mask and tubing day of surgery.                              You may not have any metal on your body including hair pins, jewelry, and body piercing             Do not wear make-up, lotions, powders, perfumes/cologne, or deodorant  Do not wear nail polish including gel and S&S, artificial/acrylic nails, or any other type of covering on natural nails including finger and toenails. If you have artificial nails, gel coating, etc. that needs to be removed by a nail salon please have this removed prior to surgery or surgery may need to be canceled/ delayed if the surgeon/ anesthesia feels like they are unable to be safely monitored.   Do not shave  48 hours prior to surgery.               Men may shave face and neck.   Do not bring valuables to the hospital. [facility] IS NOT             RESPONSIBLE   FOR VALUABLES.   Contacts, glasses, dentures or bridgework may not be worn into surgery.   Bring small overnight bag day of surgery.   DO NOT BRING YOUR HOME MEDICATIONS TO THE HOSPITAL. PHARMACY  WILL DISPENSE MEDICATIONS LISTED ON YOUR MEDICATION LIST TO YOU DURING YOUR ADMISSION IN THE HOSPITAL!    Patients discharged on the day of surgery will not be allowed to drive home.  Someone NEEDS to stay with you for the first 24 hours after anesthesia.   Special Instructions: Bring a copy of your healthcare power of attorney and living will documents the day of surgery if you haven't scanned them before.              Please read over the following fact sheets you were given: IF YOU HAVE QUESTIONS ABOUT YOUR PRE-OP INSTRUCTIONS PLEASE CALL [phone]   If you received a COVID test during your pre-op visit  it is requested that you wear a mask when out in public, stay away from anyone that may not be feeling well and notify your surgeon if you develop symptoms. If you test positive for Covid or have been in contact with anyone that has tested positive in the last 10 days please notify you surgeon.    [facility] - Preparing for Surgery Before surgery, you can play an important role.  Because skin is not sterile, your skin needs to be as free of germs as possible.  You can reduce the number of germs on your skin by washing with CHG (chlorahexidine gluconate) soap before surgery.  CHG is an antiseptic cleaner which kills  germs and bonds with the skin to continue killing germs even after washing. Please DO NOT use if you have an allergy to CHG or antibacterial soaps.  If your skin becomes reddened/irritated stop using the CHG and inform your nurse when you arrive at Short Stay. Do not shave (including legs and underarms) for at least 48 hours prior to the first CHG shower.  You may shave your face/neck. Please follow these instructions carefully:  1.  Shower with CHG Soap the night before surgery and the  morning of Surgery.  2.  If you choose to wash your hair, wash your hair first as usual with your  normal  shampoo.  3.  After you shampoo, rinse your hair and body thoroughly to remove the   shampoo.                           4.  Use CHG as you would any other liquid soap.  You can apply chg directly  to the skin and wash                       Gently with a scrungie or clean washcloth.  5.  Apply the CHG Soap to your body ONLY FROM THE NECK DOWN.   Do not use on face/ open                           Wound or open sores. Avoid contact with eyes, ears mouth and genitals (private parts).                       Wash face,  Genitals (private parts) with your normal soap.             6.  Wash thoroughly, paying special attention to the area where your surgery  will be performed.  7.  Thoroughly rinse your body with warm water from the neck down.  8.  DO NOT shower/wash with your normal soap after using and rinsing off  the CHG Soap.                9.  Pat yourself dry with a clean towel.            10.  Wear clean pajamas.            11.  Place clean sheets on your bed the night of your first shower and do not  sleep with pets. Day of Surgery : Do not apply any lotions/deodorants the morning of surgery.  Please wear clean clothes to the hospital/surgery center.  FAILURE TO FOLLOW THESE INSTRUCTIONS MAY RESULT IN THE CANCELLATION OF YOUR SURGERY PATIENT SIGNATURE_________________________________  NURSE SIGNATURE__________________________________  ________________________________________________________________________

## 2023-04-23 NOTE — Progress Notes (Addendum)
 Anesthesia Review:  PCP: [name]  LOV 08/23/22  Cardiologist : none   PPM/ ICD: Device Orders: Rep Notified:  Chest x-ray : EKG : 01/31/23  CT cors- 2019  Echo : Stress test: Cardiac Cath :   Activity level: can do a flight of stairs without difficulty  Sleep Study/ CPAP : none  Fasting Blood Sugar :      / Checks Blood Sugar -- times a day:    DM- type2- checks glucose 1-2 times daily per pt  Hgba1c-  04/05/23- 7.9  Recheck on 04/29/23- 7.9-routed to Dr Andrey Campanile on 04/30/23.  Jardiance- Hold for 72 hours - last dose on  05/02/23  Amaryl- none am of procedure , none night before procedure  Metformin- none day of procedure    Blood Thinner/ Instructions /Last Dose: ASA / Instructions/ Last Dose :    81 mg aspirin    At preop pt states he may have hernia on right side.  Gave pt phone number for DR Andrey Campanile Triage desk and instructed pt to call DR Andrey Campanile office today.  Pt voiced understandign.

## 2023-04-26 ENCOUNTER — Encounter: Payer: Self-pay | Admitting: "Endocrinology

## 2023-04-29 ENCOUNTER — Encounter (HOSPITAL_COMMUNITY)
Admission: RE | Admit: 2023-04-29 | Discharge: 2023-04-29 | Disposition: A | Discharge status: Home / Self Care | Payer: PPO | Source: Ambulatory Visit | Attending: General Surgery | Admitting: General Surgery

## 2023-04-29 ENCOUNTER — Other Ambulatory Visit: Payer: Self-pay

## 2023-04-29 ENCOUNTER — Encounter (HOSPITAL_COMMUNITY): Payer: Self-pay

## 2023-04-29 VITALS — BP 138/83 | HR 61 | Temp 98.2°F | Resp 16 | Ht 66.0 in | Wt 142.0 lb

## 2023-04-29 DIAGNOSIS — J8410 Pulmonary fibrosis, unspecified: Secondary | ICD-10-CM | POA: Diagnosis not present

## 2023-04-29 DIAGNOSIS — Z01812 Encounter for preprocedural laboratory examination: Secondary | ICD-10-CM | POA: Diagnosis not present

## 2023-04-29 DIAGNOSIS — I10 Essential (primary) hypertension: Secondary | ICD-10-CM | POA: Diagnosis not present

## 2023-04-29 DIAGNOSIS — Z794 Long term (current) use of insulin: Secondary | ICD-10-CM | POA: Insufficient documentation

## 2023-04-29 DIAGNOSIS — E119 Type 2 diabetes mellitus without complications: Secondary | ICD-10-CM | POA: Diagnosis not present

## 2023-04-29 DIAGNOSIS — Z01818 Encounter for other preprocedural examination: Secondary | ICD-10-CM

## 2023-04-29 DIAGNOSIS — K4090 Unilateral inguinal hernia, without obstruction or gangrene, not specified as recurrent: Secondary | ICD-10-CM | POA: Diagnosis not present

## 2023-04-29 LAB — BASIC METABOLIC PANEL
Anion gap: 8 (ref 5–15)
BUN: 20 mg/dL (ref 8–23)
CO2: 26 mmol/L (ref 22–32)
Calcium: 9.1 mg/dL (ref 8.9–10.3)
Chloride: 102 mmol/L (ref 98–111)
Creatinine, Ser: 1.17 mg/dL (ref 0.61–1.24)
GFR, Estimated: >60 mL/min (ref >60)
Glucose, Bld: 169 mg/dL — ABNORMAL HIGH (ref 70–99)
Potassium: 4.3 mmol/L (ref 3.5–5.1)
Sodium: 136 mmol/L (ref 135–145)

## 2023-04-29 LAB — CBC
HCT: 43.3 % (ref 39.0–52.0)
Hemoglobin: 13.6 g/dL (ref 13.0–17.0)
MCH: 29.8 pg (ref 26.0–34.0)
MCHC: 31.4 g/dL (ref 30.0–36.0)
MCV: 95 fL (ref 80.0–100.0)
Platelets: 226 10*3/uL (ref 150–400)
RBC: 4.56 MIL/uL (ref 4.22–5.81)
RDW: 12.3 % (ref 11.5–15.5)
WBC: 6 10*3/uL (ref 4.0–10.5)
nRBC: 0 % (ref 0.0–0.2)

## 2023-04-29 LAB — HEMOGLOBIN A1C
Hgb A1c MFr Bld: 7.9 % — ABNORMAL HIGH (ref 4.8–5.6)
Mean Plasma Glucose: 180.03 mg/dL

## 2023-04-29 LAB — GLUCOSE, CAPILLARY: Glucose-Capillary: 167 mg/dL — ABNORMAL HIGH (ref 70–99)

## 2023-04-30 NOTE — Progress Notes (Signed)
 Anesthesia Chart Review   Case: [id number] Date/Time: 05/06/23 0715   Procedure: OPEN LEFT INGUINAL HERNIA REPAIR WITH MESH (Left)   Anesthesia type: General   Pre-op diagnosis: LEFT INGUINAL HERNIA   Location: [facility] ROOM 05 / [facility]   Surgeons: Gaynelle Adu, MD       DISCUSSION:73 y.o. never smoker with h/o PONV, HTN, pulmonary fibrosis, IDDM, left inguinal hernia scheduled for above procedure 05/06/2023 with Dr. Gaynelle Adu.    Pt previously cancelled due to poorly controlled diabetes.  A1C 9.6 at the time.  He has since followed up with endocrinology for better management.  A1C is now 7.9.   VS: BP 138/83   Pulse 61   Temp 36.8 C (Oral)   Resp 16   Ht 5\' 6"  (1.676 m)   Wt 64.4 kg   SpO2 100%   BMI 22.92 kg/m   PROVIDERS: [name], Georgina Quint, MD is PCP    LABS: Labs reviewed: Acceptable for surgery. (all labs ordered are listed, but only abnormal results are displayed)  Labs Reviewed  BASIC METABOLIC PANEL - Abnormal; Notable for the following components:      Result Value   Glucose, Bld 169 (*)    All other components within normal limits  HEMOGLOBIN A1C - Abnormal; Notable for the following components:   Hgb A1c MFr Bld 7.9 (*)    All other components within normal limits  GLUCOSE, CAPILLARY - Abnormal; Notable for the following components:   Glucose-Capillary 167 (*)    All other components within normal limits  CBC     IMAGES:   EKG:   CV:  Past Medical History:  Diagnosis Date   ARTHRITIS, HIP 08/10/2008   BURSITIS, RIGHT SHOULDER 05/20/2008   Degeneration of lumbar or lumbosacral intervertebral disc 11/27/2006   DEGENERATION, CERVICAL DISC 11/27/2006   DIABETES MELLITUS, TYPE II 04/10/2007   Dyspnea    Epicondylitis    bilateral   Fatigue    HERPES GENITALIS 09/17/2007   Hyperlipemia    Hypertension    off of htn meds x 1.5 years per pt   HYPOTHYROIDISM 10/28/2007   Lateral epicondylitis  of elbow 05/05/2007   MRSA infection    Ear    PONV (postoperative nausea and vomiting)    Pulmonary fibrosis (HCC)    Sciatica 10/28/2007   SHOULDER PAIN, RIGHT 08/18/2008    Past Surgical History:  Procedure Laterality Date   ACHILLES TENDON REPAIR     left   COLONOSCOPY     ELBOW SURGERY  2005   left   herniated lumbar disc  1997, 2011   KNEE ARTHROSCOPY  2007   left   ROTATOR CUFF REPAIR  1997   right    MEDICATIONS:  alfuzosin (UROXATRAL) 10 MG 24 hr tablet   aspirin 81 MG tablet   atorvastatin (LIPITOR) 20 MG tablet   Blood Glucose Monitoring Suppl (ONETOUCH VERIO) w/Device KIT   CINNAMON PO   Continuous Glucose Receiver (FREESTYLE LIBRE 3 READER) DEVI   Continuous Glucose Sensor (FREESTYLE LIBRE 3 PLUS SENSOR) MISC   Dulaglutide (TRULICITY) 1.5 MG/0.5ML SOAJ   dutasteride (AVODART) 0.5 MG capsule   empagliflozin (JARDIANCE) 25 MG TABS tablet   glimepiride (AMARYL) 1 MG tablet   insulin lispro (HUMALOG KWIKPEN) 100 UNIT/ML KwikPen   Lancets (ONETOUCH ULTRASOFT) lancets   levothyroxine (SYNTHROID) 100 MCG tablet   MAGNESIUM PO   metFORMIN (GLUCOPHAGE) 1000 MG tablet   ONETOUCH VERIO test strip   valACYclovir (VALTREX) 500 MG tablet  VITAMIN D PO   No current facility-administered medications for this encounter.     Jodell Cipro Ward, PA-C [facility] Pre-Surgical Testing [phone]

## 2023-05-03 NOTE — Anesthesia Preprocedure Evaluation (Addendum)
 Anesthesia Evaluation  Patient identified by MRN, date of birth, ID band Patient awake    Reviewed: Allergy & Precautions, NPO status , Patient's Chart, lab work & pertinent test results  History of Anesthesia Complications (+) PONV and history of anesthetic complications  Airway Mallampati: III  TM Distance: >3 FB Neck ROM: Full    Dental  (+) Teeth Intact, Dental Advisory Given   Pulmonary shortness of breath Pulmonary fibrosis- no meds    Pulmonary exam normal breath sounds clear to auscultation       Cardiovascular hypertension (139/79 preop, has been off a couple years), Normal cardiovascular exam Rhythm:Regular Rate:Normal     Neuro/Psych  PSYCHIATRIC DISORDERS  Depression    negative neurological ROS     GI/Hepatic negative GI ROS, Neg liver ROS,,,  Endo/Other  diabetes, Well Controlled, Type 2, Oral Hypoglycemic AgentsHypothyroidism    Renal/GU negative Renal ROS  negative genitourinary   Musculoskeletal  (+) Arthritis , Osteoarthritis,    Abdominal   Peds  Hematology negative hematology ROS (+) Hb 13.6   Anesthesia Other Findings Trulicity LD: few months  Reproductive/Obstetrics negative OB ROS                             Anesthesia Physical Anesthesia Plan  ASA: 2  Anesthesia Plan: General and Regional   Post-op Pain Management: Regional block* and Tylenol PO (pre-op)*   Induction: Intravenous  PONV Risk Score and Plan: 2 and Ondansetron, Dexamethasone, Treatment may vary due to age or medical condition and Midazolam  Airway Management Planned: Oral ETT  Additional Equipment: None  Intra-op Plan:   Post-operative Plan: Extubation in OR  Informed Consent: I have reviewed the patients History and Physical, chart, labs and discussed the procedure including the risks, benefits and alternatives for the proposed anesthesia with the patient or authorized representative  who has indicated his/her understanding and acceptance.     Dental advisory given  Plan Discussed with: CRNA  Anesthesia Plan Comments:         Anesthesia Quick Evaluation

## 2023-05-06 ENCOUNTER — Ambulatory Visit (HOSPITAL_COMMUNITY): Payer: Self-pay | Admitting: Certified Registered"

## 2023-05-06 ENCOUNTER — Encounter (HOSPITAL_COMMUNITY): Payer: Self-pay | Admitting: General Surgery

## 2023-05-06 ENCOUNTER — Other Ambulatory Visit: Payer: Self-pay

## 2023-05-06 ENCOUNTER — Ambulatory Visit (HOSPITAL_COMMUNITY)
Admission: RE | Admit: 2023-05-06 | Discharge: 2023-05-06 | Disposition: A | Discharge status: Home / Self Care | Payer: PPO | Attending: General Surgery | Admitting: General Surgery

## 2023-05-06 ENCOUNTER — Ambulatory Visit (HOSPITAL_COMMUNITY): Payer: Self-pay | Admitting: Medical

## 2023-05-06 ENCOUNTER — Encounter (HOSPITAL_COMMUNITY)
Admission: RE | Disposition: A | Discharge status: Home / Self Care | Payer: Self-pay | Source: Home / Self Care | Attending: General Surgery

## 2023-05-06 DIAGNOSIS — K4090 Unilateral inguinal hernia, without obstruction or gangrene, not specified as recurrent: Secondary | ICD-10-CM | POA: Insufficient documentation

## 2023-05-06 DIAGNOSIS — Z7984 Long term (current) use of oral hypoglycemic drugs: Secondary | ICD-10-CM | POA: Diagnosis not present

## 2023-05-06 DIAGNOSIS — E119 Type 2 diabetes mellitus without complications: Secondary | ICD-10-CM | POA: Diagnosis not present

## 2023-05-06 DIAGNOSIS — Z01818 Encounter for other preprocedural examination: Secondary | ICD-10-CM

## 2023-05-06 DIAGNOSIS — I10 Essential (primary) hypertension: Secondary | ICD-10-CM | POA: Insufficient documentation

## 2023-05-06 DIAGNOSIS — G8918 Other acute postprocedural pain: Secondary | ICD-10-CM | POA: Diagnosis not present

## 2023-05-06 HISTORY — PX: INGUINAL HERNIA REPAIR: SHX194

## 2023-05-06 LAB — GLUCOSE, CAPILLARY
Glucose-Capillary: 151 mg/dL — ABNORMAL HIGH (ref 70–99)
Glucose-Capillary: 161 mg/dL — ABNORMAL HIGH (ref 70–99)

## 2023-05-06 SURGERY — REPAIR, HERNIA, INGUINAL, ADULT
Anesthesia: Regional | Laterality: Left

## 2023-05-06 MED ORDER — OXYCODONE HCL 5 MG/5ML PO SOLN: 5.0000 mg | Freq: Once | ORAL | Status: AC | PRN

## 2023-05-06 MED ORDER — AMISULPRIDE (ANTIEMETIC) 5 MG/2ML IV SOLN: 10.0000 mg | Freq: Once | INTRAVENOUS | Status: DC | PRN

## 2023-05-06 MED ORDER — ACETAMINOPHEN 500 MG PO TABS
1000.0000 mg | ORAL_TABLET | ORAL | Status: AC
Start: 2023-05-06 — End: 2023-05-06
  Administered 2023-05-06: 1000 mg via ORAL
  Filled 2023-05-06: qty 2

## 2023-05-06 MED ORDER — PROPOFOL 10 MG/ML IV BOLUS
INTRAVENOUS | Status: AC
  Filled 2023-05-06: qty 20

## 2023-05-06 MED ORDER — ROCURONIUM BROMIDE 10 MG/ML (PF) SYRINGE
PREFILLED_SYRINGE | INTRAVENOUS | Status: DC | PRN
  Administered 2023-05-06: 70 mg via INTRAVENOUS

## 2023-05-06 MED ORDER — CHLORHEXIDINE GLUCONATE 0.12 % MT SOLN
15.0000 mL | Freq: Once | OROMUCOSAL | Status: AC
  Administered 2023-05-06: 15 mL via OROMUCOSAL

## 2023-05-06 MED ORDER — OXYCODONE HCL 5 MG PO TABS
ORAL_TABLET | ORAL | Status: AC
Start: 2023-05-06 — End: 2023-05-06
  Administered 2023-05-06: 5 mg via ORAL
  Filled 2023-05-06: qty 1

## 2023-05-06 MED ORDER — HYDROMORPHONE HCL 1 MG/ML IJ SOLN: 0.2500 mg | INTRAMUSCULAR | Status: DC | PRN

## 2023-05-06 MED ORDER — OXYCODONE HCL 5 MG PO TABS: 5.0000 mg | ORAL_TABLET | Freq: Once | ORAL | Status: AC | PRN

## 2023-05-06 MED ORDER — ONDANSETRON HCL 4 MG/2ML IJ SOLN: 4.0000 mg | Freq: Once | INTRAMUSCULAR | Status: DC | PRN

## 2023-05-06 MED ORDER — BUPIVACAINE-EPINEPHRINE (PF) 0.25% -1:200000 IJ SOLN
INTRAMUSCULAR | Status: AC
  Filled 2023-05-06: qty 30

## 2023-05-06 MED ORDER — MIDAZOLAM HCL 2 MG/2ML IJ SOLN
INTRAMUSCULAR | Status: AC
  Filled 2023-05-06: qty 2

## 2023-05-06 MED ORDER — ONDANSETRON HCL 4 MG/2ML IJ SOLN
INTRAMUSCULAR | Status: DC | PRN
  Administered 2023-05-06: 4 mg via INTRAVENOUS

## 2023-05-06 MED ORDER — ROCURONIUM BROMIDE 10 MG/ML (PF) SYRINGE
PREFILLED_SYRINGE | INTRAVENOUS | Status: AC
  Filled 2023-05-06: qty 10

## 2023-05-06 MED ORDER — LIDOCAINE HCL (PF) 2 % IJ SOLN
INTRAMUSCULAR | Status: DC | PRN
  Administered 2023-05-06: 60 mg via INTRADERMAL

## 2023-05-06 MED ORDER — LACTATED RINGERS IV SOLN: INTRAVENOUS | Status: DC

## 2023-05-06 MED ORDER — 0.9 % SODIUM CHLORIDE (POUR BTL) OPTIME
TOPICAL | Status: DC | PRN
  Administered 2023-05-06: 1000 mL

## 2023-05-06 MED ORDER — KETOROLAC TROMETHAMINE 30 MG/ML IJ SOLN
INTRAMUSCULAR | Status: DC | PRN
  Administered 2023-05-06: 15 mg via INTRAVENOUS

## 2023-05-06 MED ORDER — FENTANYL CITRATE (PF) 100 MCG/2ML IJ SOLN
INTRAMUSCULAR | Status: AC
Start: 2023-05-06 — End: ?
  Filled 2023-05-06: qty 2

## 2023-05-06 MED ORDER — ORAL CARE MOUTH RINSE: 15.0000 mL | Freq: Once | OROMUCOSAL | Status: AC

## 2023-05-06 MED ORDER — PROPOFOL 10 MG/ML IV BOLUS
INTRAVENOUS | Status: DC | PRN
  Administered 2023-05-06: 150 mg via INTRAVENOUS

## 2023-05-06 MED ORDER — BUPIVACAINE LIPOSOME 1.3 % IJ SUSP
INTRAMUSCULAR | Status: DC | PRN
  Administered 2023-05-06: 10 mL via PERINEURAL

## 2023-05-06 MED ORDER — DEXAMETHASONE SODIUM PHOSPHATE 10 MG/ML IJ SOLN
INTRAMUSCULAR | Status: AC
  Filled 2023-05-06: qty 1

## 2023-05-06 MED ORDER — MIDAZOLAM HCL 2 MG/2ML IJ SOLN
INTRAMUSCULAR | Status: DC | PRN
Start: 2023-05-06 — End: 2023-05-06
  Administered 2023-05-06 (×2): 1 mg via INTRAVENOUS

## 2023-05-06 MED ORDER — ONDANSETRON HCL 4 MG/2ML IJ SOLN
INTRAMUSCULAR | Status: AC
  Filled 2023-05-06: qty 2

## 2023-05-06 MED ORDER — CHLORHEXIDINE GLUCONATE CLOTH 2 % EX PADS: 6.0000 | MEDICATED_PAD | Freq: Once | CUTANEOUS | Status: DC

## 2023-05-06 MED ORDER — DEXAMETHASONE SODIUM PHOSPHATE 10 MG/ML IJ SOLN
INTRAMUSCULAR | Status: DC | PRN
  Administered 2023-05-06: 4 mg via INTRAVENOUS

## 2023-05-06 MED ORDER — STERILE WATER FOR IRRIGATION IR SOLN
Status: DC | PRN
  Administered 2023-05-06: 1000 mL

## 2023-05-06 MED ORDER — FENTANYL CITRATE (PF) 100 MCG/2ML IJ SOLN
INTRAMUSCULAR | Status: DC | PRN
  Administered 2023-05-06 (×2): 50 ug via INTRAVENOUS

## 2023-05-06 MED ORDER — ACETAMINOPHEN 500 MG PO TABS: 1000.0000 mg | ORAL_TABLET | Freq: Three times a day (TID) | ORAL | Status: AC

## 2023-05-06 MED ORDER — ROPIVACAINE HCL 5 MG/ML IJ SOLN
INTRAMUSCULAR | Status: DC | PRN
  Administered 2023-05-06: 20 mL via PERINEURAL

## 2023-05-06 MED ORDER — CEFAZOLIN SODIUM-DEXTROSE 2-4 GM/100ML-% IV SOLN
2.0000 g | INTRAVENOUS | Status: AC
Start: 2023-05-06 — End: 2023-05-06
  Administered 2023-05-06: 2 g via INTRAVENOUS
  Filled 2023-05-06: qty 100

## 2023-05-06 MED ORDER — INSULIN ASPART 100 UNIT/ML IJ SOLN: 0.0000 [IU] | INTRAMUSCULAR | Status: DC | PRN

## 2023-05-06 MED ORDER — TRAMADOL HCL 50 MG PO TABS: 50.0000 mg | ORAL_TABLET | Freq: Four times a day (QID) | ORAL | 0 refills | Status: AC | PRN

## 2023-05-06 MED ORDER — BUPIVACAINE-EPINEPHRINE 0.25% -1:200000 IJ SOLN
INTRAMUSCULAR | Status: DC | PRN
  Administered 2023-05-06: 9 mL

## 2023-05-06 MED ORDER — SUGAMMADEX SODIUM 200 MG/2ML IV SOLN
INTRAVENOUS | Status: DC | PRN
  Administered 2023-05-06: 130 mg via INTRAVENOUS

## 2023-05-06 MED ORDER — EPHEDRINE SULFATE-NACL 50-0.9 MG/10ML-% IV SOSY
PREFILLED_SYRINGE | INTRAVENOUS | Status: DC | PRN
  Administered 2023-05-06 (×4): 5 mg via INTRAVENOUS

## 2023-05-06 MED ORDER — CHLORHEXIDINE GLUCONATE CLOTH 2 % EX PADS
6.0000 | MEDICATED_PAD | Freq: Once | CUTANEOUS | Status: DC
Start: 2023-05-06 — End: 2023-05-06

## 2023-05-06 SURGICAL SUPPLY — 29 items
BAG COUNTER SPONGE SURGICOUNT (BAG) IMPLANT
BENZOIN TINCTURE PRP APPL 2/3 (GAUZE/BANDAGES/DRESSINGS) IMPLANT
CHLORAPREP W/TINT 26 (MISCELLANEOUS) ×1 IMPLANT
COVER SURGICAL LIGHT HANDLE (MISCELLANEOUS) ×1 IMPLANT
DERMABOND ADVANCED .7 DNX12 (GAUZE/BANDAGES/DRESSINGS) IMPLANT
DRAIN PENROSE 0.5X18 (DRAIN) IMPLANT
DRAPE LAPAROTOMY T 98X78 PEDS (DRAPES) ×1 IMPLANT
DRSG TEGADERM 4X4.75 (GAUZE/BANDAGES/DRESSINGS) IMPLANT
ELECT CAUTERY BLADE TIP 2.5 (TIP) IMPLANT
ELECT REM PT RETURN 15FT ADLT (MISCELLANEOUS) ×1 IMPLANT
ELECTRODE CAUTERY BLDE TIP 2.5 (TIP) IMPLANT
GLOVE BIO SURGEON STRL SZ7.5 (GLOVE) ×1 IMPLANT
GLOVE INDICATOR 8.0 STRL GRN (GLOVE) ×1 IMPLANT
GOWN STRL REUS W/ TWL XL LVL3 (GOWN DISPOSABLE) ×1 IMPLANT
KIT BASIN OR (CUSTOM PROCEDURE TRAY) ×1 IMPLANT
KIT TURNOVER KIT A (KITS) IMPLANT
MARKER SKIN DUAL TIP RULER LAB (MISCELLANEOUS) ×1 IMPLANT
MESH BARD SOFT 3X6IN (Mesh General) IMPLANT
NDL HYPO 22X1.5 SAFETY MO (MISCELLANEOUS) ×1 IMPLANT
NEEDLE HYPO 22X1.5 SAFETY MO (MISCELLANEOUS) ×1 IMPLANT
PACK GENERAL/GYN (CUSTOM PROCEDURE TRAY) ×1 IMPLANT
SPIKE FLUID TRANSFER (MISCELLANEOUS) IMPLANT
STRIP CLOSURE SKIN 1/2X4 (GAUZE/BANDAGES/DRESSINGS) ×1 IMPLANT
SUT MNCRL AB 4-0 PS2 18 (SUTURE) ×1 IMPLANT
SUT PROLENE 2 0 CT2 30 (SUTURE) ×2 IMPLANT
SUT VIC AB 2-0 SH 27X BRD (SUTURE) ×1 IMPLANT
SUT VIC AB 3-0 SH 18 (SUTURE) ×1 IMPLANT
SYR 20ML LL LF (SYRINGE) ×1 IMPLANT
TOWEL OR 17X26 10 PK STRL BLUE (TOWEL DISPOSABLE) ×1 IMPLANT

## 2023-05-06 NOTE — Anesthesia Procedure Notes (Addendum)
 Anesthesia Regional Block: TAP block   Pre-Anesthetic Checklist: , timeout performed,  Correct Patient, Correct Site, Correct Laterality,  Correct Procedure, Correct Position, site marked,  Risks and benefits discussed,  Surgical consent,  Pre-op evaluation,  At surgeon's request and post-op pain management  Laterality: Left  Prep: Maximum Sterile Barrier Precautions used, chloraprep       Needles:  Injection technique: Single-shot  Needle Type: Echogenic Stimulator Needle     Needle Length: 9cm  Needle Gauge: 22     Additional Needles:   Procedures:,,,, ultrasound used (permanent image in chart),,    Narrative:  Start time: 05/06/2023 7:45 AM End time: 05/06/2023 7:50 AM Injection made incrementally with aspirations every 5 mL.  Performed by: Personally  Anesthesiologist: Lannie Fields, DO  Additional Notes: Monitors applied. No increased pain on injection. No increased resistance to injection. Injection made in 5cc increments. Good needle visualization. Patient tolerated procedure well.

## 2023-05-06 NOTE — Anesthesia Procedure Notes (Signed)
 Procedure Name: Intubation Date/Time: 05/06/2023 7:47 AM  Performed by: Sindy Guadeloupe, CRNAPre-anesthesia Checklist: Patient identified, Emergency Drugs available, Suction available, Patient being monitored and Timeout performed Patient Re-evaluated:Patient Re-evaluated prior to induction Oxygen Delivery Method: Circle system utilized Preoxygenation: Pre-oxygenation with 100% oxygen Induction Type: IV induction Ventilation: Mask ventilation without difficulty Laryngoscope Size: Mac and 4 Grade View: Grade II Tube type: Oral Tube size: 7.5 mm Number of attempts: 1 Airway Equipment and Method: Stylet Placement Confirmation: ETT inserted through vocal cords under direct vision, positive ETCO2 and breath sounds checked- equal and bilateral Secured at: 23 cm Tube secured with: Tape Dental Injury: Teeth and Oropharynx as per pre-operative assessment

## 2023-05-06 NOTE — H&P (Signed)
 CC: here for surgery  Requesting provider: n/a  HPI: YOCHANAN EDDLEMAN is an 73 y.o. male who is here for open repair of LIH. Denies changes since seen in clinic. Some right groin discomfort as well.   Old hpi Sayeed Weatherall is a 73 y.o. male who is seen today for short interval follow-up after being seen in late July for a left inguinal hernia. He has some additional questions. He also has concerns about some occasional discomfort that he has felt in his right groin. He also has had some tenderness in his epigastric area at times since I last saw him. Is only occurred once. He denies any nausea vomiting, diarrhea or constipation no melena or hematochezia. No change in stool caliber. He also had some additional questions about hernia surgery. Otherwise he denies any medical changes.   Past Medical History:  Diagnosis Date   ARTHRITIS, HIP 08/10/2008   BURSITIS, RIGHT SHOULDER 05/20/2008   Degeneration of lumbar or lumbosacral intervertebral disc 11/27/2006   DEGENERATION, CERVICAL DISC 11/27/2006   DIABETES MELLITUS, TYPE II 04/10/2007   Dyspnea    Epicondylitis    bilateral   Fatigue    HERPES GENITALIS 09/17/2007   Hyperlipemia    Hypertension    off of htn meds x 1.5 years per pt   HYPOTHYROIDISM 10/28/2007   Lateral epicondylitis  of elbow 05/05/2007   MRSA infection    Ear   PONV (postoperative nausea and vomiting)    Pulmonary fibrosis (HCC)    Sciatica 10/28/2007   SHOULDER PAIN, RIGHT 08/18/2008    Past Surgical History:  Procedure Laterality Date   ACHILLES TENDON REPAIR     left   COLONOSCOPY     ELBOW SURGERY  2005   left   herniated lumbar disc  1997, 2011   KNEE ARTHROSCOPY  2007   left   ROTATOR CUFF REPAIR  1997   right    Family History  Problem Relation Age of Onset   Diabetes Mother    Heart disease Mother    Heart disease Father    Diabetes Sister    Colon cancer Neg Hx    Esophageal cancer Neg Hx    Rectal cancer Neg Hx    Stomach  cancer Neg Hx     Social:  reports that he has never smoked. He has never used smokeless tobacco. He reports current alcohol use of about 3.0 standard drinks of alcohol per week. He reports that he does not use drugs.  Allergies: No Known Allergies  Medications: I have reviewed the patient's current medications.   ROS - all of the below systems have been reviewed with the patient and positives are indicated with bold text General: chills, fever or night sweats Eyes: blurry vision or double vision ENT: epistaxis or sore throat Allergy/Immunology: itchy/watery eyes or nasal congestion Hematologic/Lymphatic: bleeding problems, blood clots or swollen lymph nodes Endocrine: temperature intolerance or unexpected weight changes Breast: new or changing breast lumps or nipple discharge Resp: cough, shortness of breath, or wheezing CV: chest pain or dyspnea on exertion GI: as per HPI GU: dysuria, trouble voiding, or hematuria MSK: joint pain or joint stiffness Neuro: TIA or stroke symptoms Derm: pruritus and skin lesion changes Psych: anxiety and depression  PE Blood pressure 139/79, pulse (!) 59, temperature 97.9 F (36.6 C), temperature source Oral, resp. rate 18, height 5\' 6"  (1.676 m), weight 64.4 kg, SpO2 99%. Constitutional: NAD; conversant; no deformities Eyes: Moist conjunctiva; no lid lag; anicteric; PERRL  Neck: Trachea midline; no thyromegaly Lungs: Normal respiratory effort; no tactile fremitus CV: RRR; no palpable thrills; no pitting edema GI: Abd soft, nt, nd, bulge L groing. No bulge Rt groin; no palpable hepatosplenomegaly MSK: Normal gait; no clubbing/cyanosis Psychiatric: Appropriate affect; alert and oriented x3 Lymphatic: No palpable cervical or axillary lymphadenopathy Skin:no rash  Results for orders placed or performed during the hospital encounter of 05/06/23 (from the past 48 hours)  Glucose, capillary     Status: Abnormal   Collection Time: 05/06/23  6:17 AM   Result Value Ref Range   Glucose-Capillary 151 (H) 70 - 99 mg/dL    Comment: Glucose reference range applies only to samples taken after fasting for at least 8 hours.   Comment 1 Notify RN     No results found.  Imaging:   A/P: TYRRELL STEPHENS is an 73 y.o. male with LIH  To OR for open repair of LIH with mesh Iv abx All questions answered  Mary Sella. Andrey Campanile, MD, FACS General, Bariatric, & Minimally Invasive Surgery [facility]

## 2023-05-06 NOTE — Op Note (Signed)
 Open Left Indirect Inguinal Hernia Repair with Mesh Procedure Note  Indications: The patient presented with a history of a left, reducible hernia.    Pre-operative Diagnosis: left reducible inguinal hernia  Post-operative Diagnosis: left indirect inguinal hernia  Surgeon: Gaynelle Adu   Assistants: none  Anesthesia: General endotracheal anesthesia   Surgeon: Mary Sella. Andrey Campanile, MD, FACS  Procedure Details  The patient was seen again in the Holding Room. The risks, benefits, complications, treatment options, and expected outcomes were discussed with the patient. The possibilities of reaction to medication, pulmonary aspiration, perforation of viscus, bleeding, recurrent infection, the need for additional procedures, and development of a complication requiring transfusion or further operation were discussed with the patient and/or family. The likelihood of success in repairing the hernia and returning the patient to their previous functional status is good.  There was concurrence with the proposed plan, and informed consent was obtained. The site of surgery was properly noted/marked. The patient was taken to the Operating Room, identified as Ricardo [name], and the procedure verified as left inguinal hernia repair. A Time Out was held and the above information confirmed.  The patient was placed in the supine position and underwent induction of anesthesia. The lower abdomen and groin was prepped with Chloraprep and draped in the standard fashion, and 0.25% Marcaine with epinephrine was used to anesthetize the skin over the mid-portion of the inguinal canal. An oblique incision was made. Dissection was carried down through the subcutaneous tissue with cautery to the external oblique fascia.  We opened the external oblique fascia along the direction of its fibers to the external ring.  The spermatic cord was circumferentially dissected bluntly and retracted with a Penrose drain.  The ilioinguinal nerve  was identified and preserved.  The floor of the inguinal canal was inspected and there was no direct defect.  We skeletonized the spermatic cord and isolated and ligated a cord lipoma.  The indirect sac was reduced.  We used a 3 x 6 inch piece of Bard soft mesh, which was cut into a keyhole shape.  This was secured with 2-0 Prolene, beginning at the pubic tubercle, running this along the shelving edge inferiorly. Superiorly, the mesh was secured to the internal oblique fascia with interrupted 2-0 Prolene sutures.  The tails of the mesh were sutured together behind the spermatic cord.  The mesh was tucked underneath the external oblique fascia laterally.  The external oblique fascia was reapproximated with 2-0 Vicryl.  3-0 Vicryl was used to close the subcutaneous tissues and 4-0 Monocryl was used to close the skin in subcuticular fashion.  Dermabond was used to seal the incision.  A clean dressing was applied.  The patient was then extubated and brought to the recovery room in stable condition.  All sponge, instrument, and needle counts were correct prior to closure and at the conclusion of the case.  Specimen: cord lipoma - discarded  Estimated Blood Loss: Minimal                 Complications: None; patient tolerated the procedure well.         Disposition: PACU - hemodynamically stable.         Condition: stable  Mary Sella. Andrey Campanile, MD, FACS General, Bariatric, & Minimally Invasive Surgery [facility],  [facility]

## 2023-05-06 NOTE — Transfer of Care (Signed)
 Immediate Anesthesia Transfer of Care Note  Patient: Ricardo [name]  Procedure(s) Performed: OPEN LEFT INGUINAL HERNIA REPAIR WITH MESH (Left)  Patient Location: PACU  Anesthesia Type:General  Level of Consciousness: awake, drowsy, and patient cooperative  Airway & Oxygen Therapy: Patient Spontanous Breathing and Patient connected to face mask oxygen  Post-op Assessment: Report given to RN and Post -op Vital signs reviewed and stable  Post vital signs: Reviewed and stable  Last Vitals:  Vitals Value Taken Time  BP 169/79 05/06/23 0924  Temp    Pulse 76 05/06/23 0925  Resp 16 05/06/23 0925  SpO2 100 % 05/06/23 0925  Vitals shown include unfiled device data.  Last Pain:  Vitals:   05/06/23 0615  TempSrc: Oral  PainSc:          Complications: No notable events documented.

## 2023-05-06 NOTE — Discharge Instructions (Signed)
UMBILICAL OR INGUINAL HERNIA REPAIR: POST OP INSTRUCTIONS  Always review your discharge instruction sheet given to you by the facility where your surgery was performed. IF YOU HAVE DISABILITY OR FAMILY LEAVE FORMS, YOU MUST BRING THEM TO THE OFFICE FOR PROCESSING.   DO NOT GIVE THEM TO YOUR DOCTOR.  A  prescription for pain medication may be given to you upon discharge.  Take your pain medication as prescribed, if needed.  If narcotic pain medicine is not needed, then you may take acetaminophen (Tylenol) or ibuprofen (Advil) as needed. Take your usually prescribed medications unless otherwise directed. If you need a refill on your pain medication, please contact your pharmacy.  They will contact our office to request authorization. Prescriptions will not be filled after 5 pm or on week-ends. You should follow a light diet the first 24 hours after arrival home, such as soup and crackers, etc.  Be sure to include lots of fluids daily.  Resume your normal diet the day after surgery. Most patients will experience some swelling and bruising around the umbilicus or in the groin and scrotum.  Ice packs and reclining will help.  Swelling and bruising can take several days to resolve.  It is common to experience some constipation if taking pain medication after surgery.  Increasing fluid intake and taking a stool softener (such as Colace) will usually help or prevent this problem from occurring.  A mild laxative (Milk of Magnesia or Miralax) should be taken according to package directions if there are no bowel movements after 48 hours. Unless discharge instructions indicate otherwise, you may remove your bandages 24-48 hours after surgery, and you may shower at that time.  You may have steri-strips (small skin tapes) in place directly over the incision.  These strips should be left on the skin for 7-10 days.  If your surgeon used skin glue on the incision, you may shower in 24 hours.  The glue will flake off  over the next 2-3 weeks.  Any sutures or staples will be removed at the office during your follow-up visit. ACTIVITIES:  You may resume regular (light) daily activities beginning the next day--such as daily self-care, walking, climbing stairs--gradually increasing activities as tolerated.  You may have sexual intercourse when it is comfortable.  Refrain from any heavy lifting or straining until approved by your doctor. You may drive when you are no longer taking prescription pain medication, you can comfortably wear a seatbelt, and you can safely maneuver your car and apply brakes. RETURN TO WORK:  You should see your doctor in the office for a follow-up appointment approximately 2-3 weeks after your surgery.  Make sure that you call for this appointment within a day or two after you arrive home to insure a convenient appointment time. OTHER INSTRUCTIONS:     WHEN TO CALL YOUR DOCTOR: Fever over 101.0 Inability to urinate Nausea and/or vomiting Extreme swelling or bruising Continued bleeding from incision. Increased pain, redness, or drainage from the incision  The clinic staff is available to answer your questions during regular business hours.  Please don't hesitate to call and ask to speak to one of the nurses for clinical concerns.  If you have a medical emergency, go to the nearest emergency room or call 911.  A surgeon from [facility] is always on call at the hospital   [address], [place], [place]  [address] ?  [address] [address], [place], [place]   [address] [phone] ?  [phone] ? FAX [phone] Web site: [email or web address]

## 2023-05-06 NOTE — Anesthesia Postprocedure Evaluation (Signed)
 Anesthesia Post Note  Patient: Rusell Meneely Hilyer  Procedure(s) Performed: OPEN LEFT INGUINAL HERNIA REPAIR WITH MESH (Left)     Patient location during evaluation: PACU Anesthesia Type: Regional and General Level of consciousness: awake and alert, oriented and patient cooperative Pain management: pain level controlled Vital Signs Assessment: post-procedure vital signs reviewed and stable Respiratory status: spontaneous breathing, nonlabored ventilation and respiratory function stable Cardiovascular status: blood pressure returned to baseline and stable Postop Assessment: no apparent nausea or vomiting Anesthetic complications: no   No notable events documented.  Last Vitals:  Vitals:   05/06/23 1000 05/06/23 1015  BP: (!) 160/90 (!) 159/70  Pulse: 72 78  Resp: 16 17  Temp: 36.6 C 36.7 C  SpO2: 98% 97%    Last Pain:  Vitals:   05/06/23 1015  TempSrc:   PainSc: 6                  Lannie Fields

## 2023-05-07 ENCOUNTER — Encounter (HOSPITAL_COMMUNITY): Payer: Self-pay | Admitting: General Surgery

## 2023-05-13 ENCOUNTER — Ambulatory Visit: Payer: 59 | Admitting: "Endocrinology

## 2023-05-29 NOTE — Progress Notes (Unsigned)
 [name] [address] [place] [address] Phone: [phone] Subjective:   Bruce Donath, am serving as a scribe for Dr. Antoine Primas.  I'm seeing this patient by the request  of:  [name], Georgina Quint, MD  CC: Right knee pain follow-up  [id number]  03/28/2023 Appears to be acute on chronic tear noted.  Given injection back in April.  Is making improvement but I do think that it is going to be slow.  Seems to be an medial and lateral meniscal tear that we need to monitor.  Does have some underlying arthritic changes that could be contributing as well and would like to repeat x-rays to further evaluate.  Depending on how patient responds we will either consider the possibility of injections, formal physical therapy again, or the possibility of advanced imaging.  Patient is in agreement with the plan and will follow-up again in 2 to 3 months   Before patient left knee did change her mind and decided to have an injection.  Patient was given an injection in the knee the other conservative therapy measures     Ultrasound shows significant CMC arthritis noted.  Discussed with patient about ground bracing at the moment and topical anti-inflammatories.  Worsening symptoms consider the possibility and injections.  Injection     Update 05/30/2023 ANANIAS KOLANDER is a 73 y.o. male coming in with complaint of L thumb and R knee pain. Patient states that his thumb is doing better. R knee pain is the same as last visit. Painful with plant and twist motions.       Past Medical History:  Diagnosis Date   ARTHRITIS, HIP 08/10/2008   BURSITIS, RIGHT SHOULDER 05/20/2008   Degeneration of lumbar or lumbosacral intervertebral disc 11/27/2006   DEGENERATION, CERVICAL DISC 11/27/2006   DIABETES MELLITUS, TYPE II 04/10/2007   Dyspnea    Epicondylitis    bilateral   Fatigue    HERPES GENITALIS 09/17/2007   Hyperlipemia    Hypertension    off of htn meds  x 1.5 years per pt   HYPOTHYROIDISM 10/28/2007   Lateral epicondylitis  of elbow 05/05/2007   MRSA infection    Ear   PONV (postoperative nausea and vomiting)    Pulmonary fibrosis (HCC)    Sciatica 10/28/2007   SHOULDER PAIN, RIGHT 08/18/2008   Past Surgical History:  Procedure Laterality Date   ACHILLES TENDON REPAIR     left   COLONOSCOPY     ELBOW SURGERY  2005   left   herniated lumbar disc  1997, 2011   INGUINAL HERNIA REPAIR Left 05/06/2023   Procedure: OPEN LEFT INGUINAL HERNIA REPAIR WITH MESH;  Surgeon: Gaynelle Adu, MD;  Location: [facility];  Service: General;  Laterality: Left;   KNEE ARTHROSCOPY  2007   left   ROTATOR CUFF REPAIR  1997   right   Social History   Socioeconomic History   Marital status: Married    Spouse name: Not on file   Number of children: Not on file   Years of education: Not on file   Highest education level: Not on file  Occupational History   Not on file  Tobacco Use   Smoking status: Never   Smokeless tobacco: Never  Vaping Use   Vaping status: Never Used  Substance and Sexual Activity   Alcohol use: Yes    Alcohol/week: 3.0 standard drinks of alcohol    Types: 3 Cans of beer per week  Comment: occ    Drug use: No   Sexual activity: Yes  Other Topics Concern   Not on file  Social History Narrative   Not on file   Social Drivers of Health   Financial Resource Strain: Not on file  Food Insecurity: Low Risk  (01/01/2023)   Received from [facility]   Hunger Vital Sign    Worried About Running Out of Food in the Last Year: Never true    Ran Out of Food in the Last Year: Never true  Transportation Needs: No Transportation Needs (01/01/2023)   Received from [facility]    In the past 12 months, has lack of reliable transportation kept you from medical appointments, meetings, work or from getting things needed for daily living? : No  Physical Activity: Not on file  Stress: Not on file  Social Connections:  Not on file   No Known Allergies Family History  Problem Relation Age of Onset   Diabetes Mother    Heart disease Mother    Heart disease Father    Diabetes Sister    Colon cancer Neg Hx    Esophageal cancer Neg Hx    Rectal cancer Neg Hx    Stomach cancer Neg Hx     Current Outpatient Medications (Endocrine & Metabolic):    Dulaglutide (TRULICITY) 1.5 MG/0.5ML SOAJ, Inject 1.5 mg into the skin once a week.   empagliflozin (JARDIANCE) 25 MG TABS tablet, Take 1 tablet (25 mg total) by mouth daily before breakfast.   glimepiride (AMARYL) 1 MG tablet, Take 2 tablets (2 mg total) by mouth 2 (two) times daily.   insulin lispro (HUMALOG KWIKPEN) 100 UNIT/ML KwikPen, 151 - 190: 1 unit, 191 - 230: 2 units, 231 - 270: 3 units, 271 - 310: 4 units, 311 - 350: 5 units, 351 - 390: 6 units,  391 - 430: 7 units   levothyroxine (SYNTHROID) 100 MCG tablet, TAKE 1 TABLET BY MOUTH EVERY DAY   metFORMIN (GLUCOPHAGE) 1000 MG tablet, Take 1 tablet (1,000 mg total) by mouth 2 (two) times daily with a meal.  Current Outpatient Medications (Cardiovascular):    atorvastatin (LIPITOR) 20 MG tablet, TAKE 1 TABLET BY MOUTH EVERY DAY   Current Outpatient Medications (Analgesics):    aspirin 81 MG tablet, Take 81 mg by mouth at bedtime.   Current Outpatient Medications (Other):    alfuzosin (UROXATRAL) 10 MG 24 hr tablet, Take 10 mg by mouth daily.   Blood Glucose Monitoring Suppl (ONETOUCH VERIO) w/Device KIT, Check sugar 3-4 times daily. E11.65   CINNAMON PO, Take 1 tablet by mouth daily. With Berberine   Continuous Glucose Receiver (FREESTYLE LIBRE 3 READER) DEVI, As directed (Patient not taking: Reported on 04/05/2023)   Continuous Glucose Sensor (FREESTYLE LIBRE 3 PLUS SENSOR) MISC, Change sensor every 15 days. (Patient not taking: Reported on 04/05/2023)   dutasteride (AVODART) 0.5 MG capsule, Take 0.5 mg by mouth daily.   Lancets (ONETOUCH ULTRASOFT) lancets, Use as instructed.Check sugar 3-4 times daily.  E11.65   MAGNESIUM PO, Take 1 tablet by mouth daily.   ONETOUCH VERIO test strip, USE TO TEST 3 TO 4 TIMES DAILY AS DIRECTED   valACYclovir (VALTREX) 500 MG tablet, Take 1 tablet (500 mg total) by mouth daily. (Patient taking differently: Take 500 mg by mouth daily as needed.)   VITAMIN D PO, Take 1 capsule by mouth daily.   Reviewed prior external information including notes and imaging from  primary care provider  As well as notes that were available from care everywhere and other healthcare systems.  Past medical history, social, surgical and family history all reviewed in electronic medical record.  No pertanent information unless stated regarding to the chief complaint.   Review of Systems:  No headache, visual changes, nausea, vomiting, diarrhea, constipation, dizziness, abdominal pain, skin rash, fevers, chills, night sweats, weight loss, swollen lymph nodes, body aches, joint swelling, chest pain, shortness of breath, mood changes. POSITIVE muscle aches  Objective  Blood pressure 110/76, pulse 76, height 5\' 6"  (1.676 m), weight 143 lb (64.9 kg), SpO2 96%.   General: No apparent distress alert and oriented x3 mood and affect normal, dressed appropriately.  HEENT: Pupils equal, extraocular movements intact  Respiratory: Patient's speak in full sentences and does not appear short of breath  Cardiovascular: No lower extremity edema, non tender, no erythema  Right knee exam shows the patient does still have some abnormality noted.  Still positive McMurray's noted.  Ambulates with a antalgic gait   Limited muscular skeletal ultrasound was performed and interpreted by Antoine Primas, M  Limited ultrasound shows significant decrease in the hypoechoic changes noted previously but still has a lateral and medial meniscal tear with some displacement noted.  Minimal change from previous exam when it comes to this.   Impression and Recommendations:     The above documentation has been  reviewed and is accurate and complete Judi Saa, DO

## 2023-05-30 ENCOUNTER — Ambulatory Visit: Payer: Self-pay

## 2023-05-30 ENCOUNTER — Ambulatory Visit: Payer: 59 | Admitting: Family Medicine

## 2023-05-30 ENCOUNTER — Encounter: Payer: Self-pay | Admitting: Family Medicine

## 2023-05-30 VITALS — BP 110/76 | HR 76 | Ht 66.0 in | Wt 143.0 lb

## 2023-05-30 DIAGNOSIS — M79645 Pain in left finger(s): Secondary | ICD-10-CM

## 2023-05-30 DIAGNOSIS — M25561 Pain in right knee: Secondary | ICD-10-CM | POA: Diagnosis not present

## 2023-05-30 DIAGNOSIS — S83241A Other tear of medial meniscus, current injury, right knee, initial encounter: Secondary | ICD-10-CM

## 2023-05-30 DIAGNOSIS — G8929 Other chronic pain: Secondary | ICD-10-CM | POA: Diagnosis not present

## 2023-05-30 NOTE — Patient Instructions (Signed)
MRI R knee [phone] We will be in touch 

## 2023-05-30 NOTE — Assessment & Plan Note (Signed)
 Has not responded well to conservative therapy.  Been going on over a year.  Has done formal physical therapy, multiple injections and continues to have the discomfort and pain and some instability with catching.  Concern at patient's age and other comorbidities including history of back surgeries that if he fell there could be a potential catastrophic injury.  At this point to get MRI of the knee to further evaluate for any other internal derangement that could be contributing.  Patient could be a candidate for potential arthroscopic surgery instead of knee replacement.  We will discuss with patient after imaging to discuss further.

## 2023-06-05 ENCOUNTER — Telehealth: Payer: Self-pay | Admitting: Internal Medicine

## 2023-06-05 DIAGNOSIS — E039 Hypothyroidism, unspecified: Secondary | ICD-10-CM | POA: Diagnosis not present

## 2023-06-05 DIAGNOSIS — Z4889 Encounter for other specified surgical aftercare: Secondary | ICD-10-CM | POA: Diagnosis not present

## 2023-06-05 DIAGNOSIS — E1169 Type 2 diabetes mellitus with other specified complication: Secondary | ICD-10-CM | POA: Diagnosis not present

## 2023-06-05 DIAGNOSIS — N400 Enlarged prostate without lower urinary tract symptoms: Secondary | ICD-10-CM | POA: Diagnosis not present

## 2023-06-05 DIAGNOSIS — N189 Chronic kidney disease, unspecified: Secondary | ICD-10-CM | POA: Diagnosis not present

## 2023-06-05 DIAGNOSIS — I129 Hypertensive chronic kidney disease with stage 1 through stage 4 chronic kidney disease, or unspecified chronic kidney disease: Secondary | ICD-10-CM | POA: Diagnosis not present

## 2023-06-05 DIAGNOSIS — E1122 Type 2 diabetes mellitus with diabetic chronic kidney disease: Secondary | ICD-10-CM | POA: Diagnosis not present

## 2023-06-05 DIAGNOSIS — E785 Hyperlipidemia, unspecified: Secondary | ICD-10-CM | POA: Diagnosis not present

## 2023-06-05 DIAGNOSIS — G8929 Other chronic pain: Secondary | ICD-10-CM | POA: Diagnosis not present

## 2023-06-05 DIAGNOSIS — E1165 Type 2 diabetes mellitus with hyperglycemia: Secondary | ICD-10-CM | POA: Diagnosis not present

## 2023-06-05 NOTE — Telephone Encounter (Signed)
 Copied from CRM [id number]. Topic: Clinical - Request for Lab/Test Order >> Jun 05, 2023  9:52 AM Denese Killings wrote: Reason for CRM: Patient is wanting to get his urine checked and is requesting an order to be put in so he can get it checked. Please call patient.  *Advised of the next office visit time he said that's too far out

## 2023-06-07 ENCOUNTER — Ambulatory Visit: Admitting: Family Medicine

## 2023-06-07 ENCOUNTER — Ambulatory Visit: Payer: Self-pay

## 2023-06-07 ENCOUNTER — Encounter: Payer: Self-pay | Admitting: Family Medicine

## 2023-06-07 VITALS — BP 130/70 | HR 63 | Temp 98.7°F | Ht 66.0 in | Wt 145.0 lb

## 2023-06-07 DIAGNOSIS — N401 Enlarged prostate with lower urinary tract symptoms: Secondary | ICD-10-CM

## 2023-06-07 DIAGNOSIS — R3912 Poor urinary stream: Secondary | ICD-10-CM

## 2023-06-07 DIAGNOSIS — N3001 Acute cystitis with hematuria: Secondary | ICD-10-CM | POA: Diagnosis not present

## 2023-06-07 DIAGNOSIS — R309 Painful micturition, unspecified: Secondary | ICD-10-CM

## 2023-06-07 LAB — POCT URINALYSIS DIP (CLINITEK)
Bilirubin, UA: NEGATIVE
Glucose, UA: >=1000 mg/dL — AB
Ketones, POC UA: NEGATIVE mg/dL
Nitrite, UA: POSITIVE — AB
Spec Grav, UA: 1.02 (ref 1.010–1.025)
Urobilinogen, UA: 0.2 U/dL
pH, UA: 5.5 (ref 5.0–8.0)

## 2023-06-07 MED ORDER — CEPHALEXIN 500 MG PO CAPS: 500.0000 mg | ORAL_CAPSULE | Freq: Two times a day (BID) | ORAL | 0 refills | Status: AC

## 2023-06-07 NOTE — Telephone Encounter (Signed)
 Copied from CRM [id number]. Topic: Clinical - Red Word Triage >> Jun 07, 2023 10:13 AM Gurney Maxin H wrote: [facility] that prompted transfer to Nurse Triage: Pain while urinating, last 2 days excruciating pain very cloudy, looks like it's mucus. Since yesterday went to the bathroom 32 times   Chief Complaint: Painful urination  Symptoms: Painful urination, increased urinary frequency Frequency: Intermittent pain  Pertinent Negatives: Patient denies fever, flank pain  Disposition: [] ED /[] Urgent Care (no appt availability in office) / [x] Appointment(In office/virtual)/ []  [place] [facility]/ [] Home Care/ [] Refused Recommended Disposition /[] [place] [place] [facility]/ []  Follow-up with PCP Additional Notes: Patient reports he has had intermittent painful urination for the last few weeks. He states that with the painful urination he has also been experiencing increased urinary frequency and decreased output, stating that since 12pm yesterday he has gone to the bathroom 32 times. Patient denies any fever or flank pain. Appointment made for the patient today for evaluation and treatment of his symptoms.    Reason for Disposition  All other males with painful urination  Answer Assessment - Initial Assessment Questions 1. SEVERITY: "How bad is the pain?"  (e.g., Scale 1-10; mild, moderate, or severe)   - MILD (1-3): Complains slightly about urination hurting.   - MODERATE (4-7): Interferes with normal activities.     - SEVERE (8-10): Excruciating, unwilling or unable to urinate because of the pain.      Moderate  2. FREQUENCY: "How many times have you had painful urination today?"      32 times since yesterday at 12pm 3. PATTERN: "Is pain present every time you urinate or just sometimes?"      No 4. ONSET: "When did the painful urination start?"      A few weeks  5. FEVER: "Do you have a fever?" If Yes, ask: "What is your temperature, how was it measured, and when did it start?"     No 6. PAST  UTI: "Have you had a urine infection before?" If Yes, ask: "When was the last time?" and "What happened that time?"      No 7. CAUSE: "What do you think is causing the painful urination?"      Possible UTI 8. OTHER SYMPTOMS: "Do you have any other symptoms?" (e.g., flank pain, penis discharge, scrotal pain, blood in urine)     Increased urinary frequency, cloudy urine  Protocols used: Urination Pain - Male-A-AH

## 2023-06-07 NOTE — Patient Instructions (Signed)
 I have sent in keflex for you to take twice a day for 7 days. Please eat when you take this medication, it can upset your stomach if you do not. Please be sure to complete the course of antibiotics even if you are feeling better.   I have sent your urine for culture. We will be in contact for any results that results that require further attention.  Follow-up with me for new or worsening symptoms.

## 2023-06-07 NOTE — Progress Notes (Signed)
 Acute Office Visit  Subjective:     Patient ID: Ricardo [name], male    DOB: [birth year], 73 y.o.   MRN: [id number]  Chief Complaint  Patient presents with   Dysuria    Dysuria    Patient is in today for painful urination, urinary urgency, frequency, cloudy urine for the past few days. Has significant urinary history including history of prostatitis syndrome, genital herpes, history of bladder neck obstruction, BPH. He is followed by Dr. Gillian Shields with [facility]. Currently taking Avodart and alfuzosin. Reports that Jardiance was increased to 25 mg 2 months ago. Has also had inguinal hernia repair a month ago. Denies gross hematuria. Denies abdominal pain, flank pain, nausea, vomiting, diarrhea, rash, chills, fever, other symptoms. Denies other concerns today. Medical history as outlined below.   Review of Systems  Genitourinary:  Positive for dysuria.   Per HPI      Objective:    BP 130/70 (BP Location: Left Arm, Patient Position: Sitting)   Pulse 63   Temp 98.7 F (37.1 C) (Temporal)   Ht 5\' 6"  (1.676 m)   Wt 145 lb (65.8 kg)   SpO2 96%   BMI 23.40 kg/m    Physical Exam Vitals and nursing note reviewed.  Constitutional:      General: He is not in acute distress.    Appearance: Normal appearance.  HENT:     Head: Normocephalic and atraumatic.     Right Ear: External ear normal.     Left Ear: External ear normal.     Nose: Nose normal.     Mouth/Throat:     Mouth: Mucous membranes are moist.     Pharynx: Oropharynx is clear.  Eyes:     Extraocular Movements: Extraocular movements intact.  Cardiovascular:     Rate and Rhythm: Normal rate.  Pulmonary:     Effort: Pulmonary effort is normal.  Musculoskeletal:        General: Normal range of motion.     Cervical back: Normal range of motion.     Right lower leg: No edema.     Left lower leg: No edema.  Lymphadenopathy:     Cervical: No cervical adenopathy.  Skin:    General: Skin is warm  and dry.  Neurological:     General: No focal deficit present.     Mental Status: He is alert and oriented to person, place, and time.  Psychiatric:        Mood and Affect: Mood normal.        Behavior: Behavior normal.     Results for orders placed or performed in visit on 06/07/23  POCT URINALYSIS DIP (CLINITEK)  Result Value Ref Range   Color, UA yellow yellow   Clarity, UA cloudy (A) clear   Glucose, UA >=1,000 (A) negative mg/dL   Bilirubin, UA negative negative   Ketones, POC UA negative negative mg/dL   Spec Grav, UA 0.254 2.706 - 1.025   Blood, UA moderate (A) negative   pH, UA 5.5 5.0 - 8.0   POC PROTEIN,UA trace negative, trace   Urobilinogen, UA 0.2 0.2 or 1.0 E.U./dL   Nitrite, UA Positive (A) Negative   Leukocytes, UA Large (3+) (A) Negative        Assessment & Plan:   Painful urination -     POCT URINALYSIS DIP (CLINITEK) -     Urine Culture  Benign prostatic hyperplasia with weak urinary stream -     Urine Culture  Acute cystitis with hematuria -     Cephalexin; Take 1 capsule (500 mg total) by mouth 2 (two) times daily for 7 days.  Dispense: 14 capsule; Refill: 0  Continue current med regimen  Meds ordered this encounter  Medications   cephALEXin (KEFLEX) 500 MG capsule    Sig: Take 1 capsule (500 mg total) by mouth 2 (two) times daily for 7 days.    Dispense:  14 capsule    Refill:  0    Return if symptoms worsen or fail to improve.  Sherald Barge, FNP

## 2023-06-09 LAB — URINE CULTURE

## 2023-06-11 NOTE — Telephone Encounter (Signed)
 Patient has since been seen by Casimer Clear FNP

## 2023-06-13 ENCOUNTER — Other Ambulatory Visit: Payer: Self-pay | Admitting: Internal Medicine

## 2023-06-17 ENCOUNTER — Ambulatory Visit
Admission: RE | Admit: 2023-06-17 | Discharge: 2023-06-17 | Discharge status: Home / Self Care | Source: Ambulatory Visit | Attending: Family Medicine | Admitting: Family Medicine

## 2023-06-17 ENCOUNTER — Other Ambulatory Visit

## 2023-06-17 DIAGNOSIS — G8929 Other chronic pain: Secondary | ICD-10-CM

## 2023-06-17 DIAGNOSIS — M25561 Pain in right knee: Secondary | ICD-10-CM | POA: Diagnosis not present

## 2023-06-21 ENCOUNTER — Encounter: Payer: Self-pay | Admitting: Family Medicine

## 2023-07-05 ENCOUNTER — Encounter: Payer: Self-pay | Admitting: "Endocrinology

## 2023-07-05 ENCOUNTER — Ambulatory Visit (INDEPENDENT_AMBULATORY_CARE_PROVIDER_SITE_OTHER): Payer: 59 | Admitting: "Endocrinology

## 2023-07-05 VITALS — BP 106/80 | HR 66 | Ht 66.0 in | Wt 144.0 lb

## 2023-07-05 DIAGNOSIS — E1165 Type 2 diabetes mellitus with hyperglycemia: Secondary | ICD-10-CM | POA: Diagnosis not present

## 2023-07-05 DIAGNOSIS — E7800 Pure hypercholesterolemia, unspecified: Secondary | ICD-10-CM | POA: Diagnosis not present

## 2023-07-05 DIAGNOSIS — Z7984 Long term (current) use of oral hypoglycemic drugs: Secondary | ICD-10-CM

## 2023-07-05 NOTE — Progress Notes (Signed)
 Outpatient Endocrinology Note Ricardo Newcomer, MD  07/05/23   Ricardo [name] 03/18/1950 [id number]  Referring Provider: Genia Kettering, MD Primary Care Provider: Genia Kettering, MD Reason for consultation: Subjective   Assessment & Plan  Diagnoses and all orders for this visit:  Uncontrolled type 2 diabetes mellitus with hyperglycemia, without long-term current use of insulin  (HCC) -     Lipid panel -     Microalbumin / creatinine urine ratio -     Hemoglobin A1c  Long term (current) use of oral hypoglycemic drugs  Long-term (current) use of injectable non-insulin  antidiabetic drugs  Pure hypercholesterolemia -     Lipid panel -     Microalbumin / creatinine urine ratio   Diabetes Type II complicated by neuropathy,  Lab Results  Component Value Date   GFR 66.55 08/24/2022   Hba1c goal less than 7, current Hba1c is  Lab Results  Component Value Date   HGBA1C 7.9 (H) 04/29/2023   Will recommend the following: metformin  1 g twice a day, glimepiride  1 mg up: 2 pills twice a day, jardiance  25 mg every day   Self stopped Trulicity  1.5 and 3 mg weekly due to nausea, patient says he is waiting for his A1C  Did not start Humalog   Was prescribed humalog  based on sliding scale  Correction scale:   151 - 190: 1 unit 191 - 230: 2 units 231 - 270: 3 units 271 - 310: 4 units 311 - 350: 5 units 351 - 390: 6 units 391 - 430: 7 units   Self stopped Trulicity  3 mg weekly due to nausea, mounjaro  not covered   No known contraindications/side effects to any of above medications  -Last LD and Tg are as follows: Lab Results  Component Value Date   LDLCALC 91 08/24/2022    Lab Results  Component Value Date   TRIG 67.0 08/24/2022   -On atorvastatin  20 mg QD -Follow low fat diet and exercise   -Blood pressure goal <140/90 - Microalbumin/creatinine goal is < 30 -Last MA/Cr is as follows: Lab Results  Component Value Date   MICROALBUR <0.7  08/24/2022   -not on ACE/ARB  -diet changes including salt restriction -limit eating outside -counseled BP targets per standards of diabetes care -uncontrolled blood pressure can lead to retinopathy, nephropathy and cardiovascular and atherosclerotic heart disease  Reviewed and counseled on: -A1C target -Blood sugar targets -Complications of uncontrolled diabetes  -Checking blood sugar before meals and bedtime and bring log next visit -All medications with mechanism of action and side effects -Hypoglycemia management: rule of 15's, Glucagon Emergency Kit and medical alert ID -low-carb low-fat plate-method diet -At least 20 minutes of physical activity per day -Annual dilated retinal eye exam and foot exam -compliance and follow up needs -follow up as scheduled or earlier if problem gets worse  Call if blood sugar is less than 70 or consistently above 250    Take a 15 gm snack of carbohydrate at bedtime before you go to sleep if your blood sugar is less than 100.    If you are going to fast after midnight for a test or procedure, ask your physician for instructions on how to reduce/decrease your insulin  dose.    Call if blood sugar is less than 70 or consistently above 250  -Treating a low sugar by rule of 15  (15 gms of sugar every 15 min until sugar is more than 70) If you feel your sugar is low, test  your sugar to be sure If your sugar is low (less than 70), then take 15 grams of a fast acting Carbohydrate (3-4 glucose tablets or glucose gel or 4 ounces of juice or regular soda) Recheck your sugar 15 min after treating low to make sure it is more than 70 If sugar is still less than 70, treat again with 15 grams of carbohydrate          Don't drive the hour of hypoglycemia  If unconscious/unable to eat or drink by mouth, use glucagon injection or nasal spray baqsimi and call 911. Can repeat again in 15 min if still unconscious.  Return in about 6 weeks (around 08/16/2023) for  visit and 8 am labs before next visit.   I have reviewed current medications, nurse's notes, allergies, vital signs, past medical and surgical history, family medical history, and social history for this encounter. Counseled patient on symptoms, examination findings, lab findings, imaging results, treatment decisions and monitoring and prognosis. The patient understood the recommendations and agrees with the treatment plan. All questions regarding treatment plan were fully answered.  Ricardo Newcomer, MD  07/05/23   History of Present Illness Ricardo [name] is a 73 y.o. year old male who presents for follow up of Type II diabetes mellitus.  Ricardo [name] was first diagnosed in 2009.   Diabetes education +  Home diabetes regimen: metformin  1 g twice a day, glimepiride  1 mg 2 pills bid, jardiance  25 mg every day   Past history: At diagnosis he was having symptoms of fatigue.  He was started on glipizide  initially and subsequently metformin  was added.  Further details are not available However he has been somewhat irregular with his follow-up and did not have an A1c with his PCP between 8/13 and 11/15 He was referred here because of rising A1c of 7.5 He had been started on Trulicity  in 4/17 because of gradually increasing A1c He stopped taking Trulicity  in 2018 because of out-of-pocket expense glipizide  ER 2.5 mg  COMPLICATIONS -  MI/Stroke -  retinopathy +  neuropathy -  nephropathy  BLOOD SUGAR DATA 80-173 Checks 1-4 times a day  Physical Exam  BP 106/80   Pulse 66   Ht 5\' 6"  (1.676 m)   Wt 144 lb (65.3 kg)   SpO2 95%   BMI 23.24 kg/m    Constitutional: well developed, well nourished Head: normocephalic, atraumatic Eyes: sclera anicteric, no redness Neck: supple Lungs: normal respiratory effort Neurology: alert and oriented Skin: dry, no appreciable rashes Musculoskeletal: no appreciable defects Psychiatric: normal mood and affect Diabetic Foot Exam - Simple    No data filed      Current Medications Patient's Medications  New Prescriptions   No medications on file  Previous Medications   ALFUZOSIN (UROXATRAL) 10 MG 24 HR TABLET    Take 10 mg by mouth daily.   ASPIRIN 81 MG TABLET    Take 81 mg by mouth at bedtime.   ATORVASTATIN  (LIPITOR) 20 MG TABLET    TAKE 1 TABLET BY MOUTH EVERY DAY   BLOOD GLUCOSE MONITORING SUPPL (ONETOUCH VERIO) W/DEVICE KIT    Check sugar 3-4 times daily. E11.65   CINNAMON PO    Take 1 tablet by mouth daily. With Berberine   CONTINUOUS GLUCOSE RECEIVER (FREESTYLE LIBRE 3 READER) DEVI    As directed   CONTINUOUS GLUCOSE SENSOR (FREESTYLE LIBRE 3 PLUS SENSOR) MISC    Change sensor every 15 days.   DULAGLUTIDE  (TRULICITY ) 1.5 MG/0.5ML SOAJ  Inject 1.5 mg into the skin once a week.   DUTASTERIDE (AVODART) 0.5 MG CAPSULE    Take 0.5 mg by mouth daily.   EMPAGLIFLOZIN  (JARDIANCE ) 25 MG TABS TABLET    Take 1 tablet (25 mg total) by mouth daily before breakfast.   GLIMEPIRIDE  (AMARYL ) 1 MG TABLET    Take 2 tablets (2 mg total) by mouth 2 (two) times daily.   INSULIN  LISPRO (HUMALOG  KWIKPEN) 100 UNIT/ML KWIKPEN    151 - 190: 1 unit, 191 - 230: 2 units, 231 - 270: 3 units, 271 - 310: 4 units, 311 - 350: 5 units, 351 - 390: 6 units,  391 - 430: 7 units   LANCETS (ONETOUCH ULTRASOFT) LANCETS    Use as instructed.Check sugar 3-4 times daily. E11.65   LEVOTHYROXINE  (SYNTHROID ) 100 MCG TABLET    TAKE 1 TABLET BY MOUTH EVERY DAY   MAGNESIUM PO    Take 1 tablet by mouth daily.   METFORMIN  (GLUCOPHAGE ) 1000 MG TABLET    Take 1 tablet (1,000 mg total) by mouth 2 (two) times daily with a meal.   ONETOUCH VERIO TEST STRIP    USE TO TEST 3 TO 4 TIMES DAILY AS DIRECTED   VALACYCLOVIR  (VALTREX ) 500 MG TABLET    Take 1 tablet (500 mg total) by mouth daily.   VITAMIN D  PO    Take 1 capsule by mouth daily.  Modified Medications   No medications on file  Discontinued Medications   No medications on file    Allergies No Known  Allergies  Past Medical History Past Medical History:  Diagnosis Date   ARTHRITIS, HIP 08/10/2008   BURSITIS, RIGHT SHOULDER 05/20/2008   Degeneration of lumbar or lumbosacral intervertebral disc 11/27/2006   DEGENERATION, CERVICAL DISC 11/27/2006   DIABETES MELLITUS, TYPE II 04/10/2007   Dyspnea    Epicondylitis    bilateral   Fatigue    HERPES GENITALIS 09/17/2007   Hyperlipemia    Hypertension    off of htn meds x 1.5 years per pt   HYPOTHYROIDISM 10/28/2007   Lateral epicondylitis  of elbow 05/05/2007   MRSA infection    Ear   PONV (postoperative nausea and vomiting)    Pulmonary fibrosis (HCC)    Sciatica 10/28/2007   SHOULDER PAIN, RIGHT 08/18/2008    Past Surgical History Past Surgical History:  Procedure Laterality Date   ACHILLES TENDON REPAIR     left   COLONOSCOPY     ELBOW SURGERY  2005   left   herniated lumbar disc  1997, 2011   INGUINAL HERNIA REPAIR Left 05/06/2023   Procedure: OPEN LEFT INGUINAL HERNIA REPAIR WITH MESH;  Surgeon: Aldean Hummingbird, MD;  Location: [facility];  Service: General;  Laterality: Left;   KNEE ARTHROSCOPY  2007   left   ROTATOR CUFF REPAIR  1997   right    Family History family history includes Diabetes in his mother and sister; Heart disease in his father and mother.  Social History Social History   Socioeconomic History   Marital status: Married    Spouse name: Not on file   Number of children: Not on file   Years of education: Not on file   Highest education level: Not on file  Occupational History   Not on file  Tobacco Use   Smoking status: Never   Smokeless tobacco: Never  Vaping Use   Vaping status: Never Used  Substance and Sexual Activity   Alcohol use: Yes  Alcohol/week: 3.0 standard drinks of alcohol    Types: 3 Cans of beer per week    Comment: occ    Drug use: No   Sexual activity: Yes  Other Topics Concern   Not on file  Social History Narrative   Not on file   Social Drivers of Health    Financial Resource Strain: Not on file  Food Insecurity: Low Risk  (01/01/2023)   Received from [facility]   Hunger Vital Sign    Worried About Running Out of Food in the Last Year: Never true    Ran Out of Food in the Last Year: Never true  Transportation Needs: No Transportation Needs (01/01/2023)   Received from [facility]    In the past 12 months, has lack of reliable transportation kept you from medical appointments, meetings, work or from getting things needed for daily living? : No  Physical Activity: Not on file  Stress: Not on file  Social Connections: Not on file  Intimate Partner Violence: Not on file    Lab Results  Component Value Date   HGBA1C 7.9 (H) 04/29/2023   HGBA1C 7.9 (A) 04/05/2023   HGBA1C 9.6 (H) 01/31/2023   Lab Results  Component Value Date   CHOL 164 08/24/2022   Lab Results  Component Value Date   HDL 59.50 08/24/2022   Lab Results  Component Value Date   LDLCALC 91 08/24/2022   Lab Results  Component Value Date   TRIG 67.0 08/24/2022   Lab Results  Component Value Date   CHOLHDL 3 08/24/2022   Lab Results  Component Value Date   CREATININE 1.17 04/29/2023   Lab Results  Component Value Date   GFR 66.55 08/24/2022   Lab Results  Component Value Date   MICROALBUR <0.7 08/24/2022      Component Value Date/Time   NA 136 04/29/2023 0859   NA 145 (H) 02/05/2018 1342   K 4.3 04/29/2023 0859   CL 102 04/29/2023 0859   CO2 26 04/29/2023 0859   GLUCOSE 169 (H) 04/29/2023 0859   BUN 20 04/29/2023 0859   BUN 13 02/05/2018 1342   CREATININE 1.17 04/29/2023 0859   CALCIUM  9.1 04/29/2023 0859   PROT 6.6 08/24/2022 0812   ALBUMIN 4.1 08/24/2022 0812   AST 12 08/24/2022 0812   ALT 11 08/24/2022 0812   ALKPHOS 34 (L) 08/24/2022 0812   BILITOT 0.7 08/24/2022 0812   GFRNONAA >60 04/29/2023 0859   GFRAA 74 02/05/2018 1342      Latest Ref Rng & Units 04/29/2023    8:59 AM 01/31/2023    8:28 AM 08/24/2022     8:12 AM  BMP  Glucose 70 - 99 mg/dL 161  096  045   BUN 8 - 23 mg/dL 20  19  18    Creatinine 0.61 - 1.24 mg/dL 4.09  8.11  9.14   Sodium 135 - 145 mmol/L 136  138  137   Potassium 3.5 - 5.1 mmol/L 4.3  4.5  4.6   Chloride 98 - 111 mmol/L 102  103  100   CO2 22 - 32 mmol/L 26  26  31    Calcium  8.9 - 10.3 mg/dL 9.1  9.5  9.5        Component Value Date/Time   WBC 6.0 04/29/2023 0859   RBC 4.56 04/29/2023 0859   HGB 13.6 04/29/2023 0859   HCT 43.3 04/29/2023 0859   PLT 226 04/29/2023 0859   MCV  95.0 04/29/2023 0859   MCH 29.8 04/29/2023 0859   MCHC 31.4 04/29/2023 0859   RDW 12.3 04/29/2023 0859   LYMPHSABS 1.5 08/24/2022 0812   MONOABS 0.5 08/24/2022 0812   EOSABS 0.2 08/24/2022 0812   BASOSABS 0.0 08/24/2022 7829     Parts of this note may have been dictated using voice recognition software. There may be variances in spelling and vocabulary which are unintentional. Not all errors are proofread. Please notify the [facility] if any discrepancies are noted or if the meaning of any statement is not clear.

## 2023-07-05 NOTE — Patient Instructions (Signed)

## 2023-07-17 ENCOUNTER — Other Ambulatory Visit: Payer: Self-pay | Admitting: General Surgery

## 2023-07-17 DIAGNOSIS — Z8719 Personal history of other diseases of the digestive system: Secondary | ICD-10-CM

## 2023-07-18 ENCOUNTER — Other Ambulatory Visit: Payer: Self-pay | Admitting: General Surgery

## 2023-07-18 ENCOUNTER — Ambulatory Visit (INDEPENDENT_AMBULATORY_CARE_PROVIDER_SITE_OTHER): Admitting: Family Medicine

## 2023-07-18 VITALS — BP 122/82 | Ht 66.0 in | Wt 141.0 lb

## 2023-07-18 DIAGNOSIS — Z8719 Personal history of other diseases of the digestive system: Secondary | ICD-10-CM

## 2023-07-18 DIAGNOSIS — R1032 Left lower quadrant pain: Secondary | ICD-10-CM

## 2023-07-18 NOTE — Progress Notes (Addendum)
 PCP: [name], Oakley Bellman, MD  Subjective:   HPI: Patient is a 73 y.o. male here for left groin pain.  Reports the pain has continued to be unbearable, particularly with anything that strained his abdomen such as sneezing, coughing, laughing and even talking.  He is worried about returning to the gym or going walking that would exacerbate his symptoms.  Not currently taking anything for the pain.  H/o open left inguinal hernia repair with mesh on 05/06/2023, now having persistent left groin pain with numbness that radiates down to his testicle.  Has followed up with the surgeon Dr. Elvan Hamel at [facility] surgery.  Continued postoperative pain, he was referred for CT scan of the region, and sports med evaluation for possible physical therapy.  Prior to surgery reports he has had a dull pain in his left hip for a few years.  Does not limit ADLs and only occurring intermittently.  H/O right hip OA with minimal pain.  Additionally h/o lumbar surgery but denies lumbar pain today.   Past Medical History:  Diagnosis Date   ARTHRITIS, HIP 08/10/2008   BURSITIS, RIGHT SHOULDER 05/20/2008   Degeneration of lumbar or lumbosacral intervertebral disc 11/27/2006   DEGENERATION, CERVICAL DISC 11/27/2006   DIABETES MELLITUS, TYPE II 04/10/2007   Dyspnea    Epicondylitis    bilateral   Fatigue    HERPES GENITALIS 09/17/2007   Hyperlipemia    Hypertension    off of htn meds x 1.5 years per pt   HYPOTHYROIDISM 10/28/2007   Lateral epicondylitis  of elbow 05/05/2007   MRSA infection    Ear   PONV (postoperative nausea and vomiting)    Pulmonary fibrosis (HCC)    Sciatica 10/28/2007   SHOULDER PAIN, RIGHT 08/18/2008    Current Outpatient Medications on File Prior to Visit  Medication Sig Dispense Refill   alfuzosin (UROXATRAL) 10 MG 24 hr tablet Take 10 mg by mouth daily.     aspirin 81 MG tablet Take 81 mg by mouth at bedtime.     atorvastatin  (LIPITOR) 20 MG tablet TAKE 1 TABLET BY MOUTH  EVERY DAY 90 tablet 3   Blood Glucose Monitoring Suppl (ONETOUCH VERIO) w/Device KIT Check sugar 3-4 times daily. E11.65 1 kit 0   CINNAMON PO Take 1 tablet by mouth daily. With Berberine     Continuous Glucose Receiver (FREESTYLE LIBRE 3 READER) DEVI As directed (Patient not taking: Reported on 04/05/2023) 1 each 1   Continuous Glucose Sensor (FREESTYLE LIBRE 3 PLUS SENSOR) MISC Change sensor every 15 days. (Patient not taking: Reported on 07/05/2023) 2 each 11   Dulaglutide  (TRULICITY ) 1.5 MG/0.5ML SOAJ Inject 1.5 mg into the skin once a week. (Patient not taking: Reported on 07/05/2023) 6 mL 1   dutasteride (AVODART) 0.5 MG capsule Take 0.5 mg by mouth daily.     empagliflozin  (JARDIANCE ) 25 MG TABS tablet Take 1 tablet (25 mg total) by mouth daily before breakfast. 90 tablet 1   glimepiride  (AMARYL ) 1 MG tablet Take 2 tablets (2 mg total) by mouth 2 (two) times daily. 120 tablet 2   insulin  lispro (HUMALOG  KWIKPEN) 100 UNIT/ML KwikPen 151 - 190: 1 unit, 191 - 230: 2 units, 231 - 270: 3 units, 271 - 310: 4 units, 311 - 350: 5 units, 351 - 390: 6 units,  391 - 430: 7 units (Patient not taking: Reported on 07/05/2023) 15 mL 11   Lancets (ONETOUCH ULTRASOFT) lancets Use as instructed.Check sugar 3-4 times daily. E11.65 100 each 12  levothyroxine  (SYNTHROID ) 100 MCG tablet TAKE 1 TABLET BY MOUTH EVERY DAY 90 tablet 2   MAGNESIUM PO Take 1 tablet by mouth daily.     metFORMIN  (GLUCOPHAGE ) 1000 MG tablet Take 1 tablet (1,000 mg total) by mouth 2 (two) times daily with a meal. 180 tablet 3   ONETOUCH VERIO test strip USE TO TEST 3 TO 4 TIMES DAILY AS DIRECTED 400 strip 3   valACYclovir  (VALTREX ) 500 MG tablet Take 1 tablet (500 mg total) by mouth daily. (Patient taking differently: Take 500 mg by mouth daily as needed.) 90 tablet 0   VITAMIN D  PO Take 1 capsule by mouth daily.     No current facility-administered medications on file prior to visit.    Past Surgical History:  Procedure Laterality Date    ACHILLES TENDON REPAIR     left   COLONOSCOPY     ELBOW SURGERY  2005   left   herniated lumbar disc  1997, 2011   INGUINAL HERNIA REPAIR Left 05/06/2023   Procedure: OPEN LEFT INGUINAL HERNIA REPAIR WITH MESH;  Surgeon: Aldean Hummingbird, MD;  Location: [facility];  Service: General;  Laterality: Left;   KNEE ARTHROSCOPY  2007   left   ROTATOR CUFF REPAIR  1997   right    No Known Allergies  BP 122/82   Ht 5\' 6"  (1.676 m)   Wt 141 lb (64 kg)   BMI 22.76 kg/m       No data to display              No data to display              Objective:  Physical Exam:  Gen: NAD, comfortable in exam room MSK: Low back/left hip: -Well-healed surgical scar in proximal left inguinal region without surrounding erythema or purulent drainage.  TTP over surgical site. -No other ecchymosis or deformity noted. -Nontender over lumbar spinous process and lateral hip -5/5 hip flexion and knee flexion/extension bilaterally -Negative Thomas, FABER and FADIR, stinchfield -Negative resisted hip flexion/extension   Assessment & Plan:  1.  Left groin pain: Likely secondary to inguinal repair surgery vs ilioinguinal nerve entrapment.  Lumbar/hip exam negative, no indication for PT at this time.  Follow-up with surgeon regarding CT results.  Consider addition of gabapentin pending CT results for symptomatic relief. If persistent pain despite further evaluation, would consider US  of ilioinguinal nerve to assess for benefit of ilioinguinal nerve hydrodissection.

## 2023-07-18 NOTE — Patient Instructions (Signed)
 Your exam is reassuring from an orthopedic standpoint. I don't think physical therapy will help you. I would recommend going ahead with the CT scan. If this is normal consider gabapentin to help with symptoms. If still not improving and pain is where it is now (usually in a couple more months) we can consider ultrasound-guided hydrodissection of the ilioinguinal nerve here - follow up with me if that is the case.

## 2023-07-19 ENCOUNTER — Ambulatory Visit
Admission: RE | Admit: 2023-07-19 | Discharge: 2023-07-19 | Disposition: A | Discharge status: Home / Self Care | Source: Ambulatory Visit | Attending: General Surgery | Admitting: General Surgery

## 2023-07-19 DIAGNOSIS — Z8719 Personal history of other diseases of the digestive system: Secondary | ICD-10-CM

## 2023-07-19 DIAGNOSIS — K4090 Unilateral inguinal hernia, without obstruction or gangrene, not specified as recurrent: Secondary | ICD-10-CM | POA: Diagnosis not present

## 2023-07-19 MED ORDER — IOPAMIDOL (ISOVUE-300) INJECTION 61%
100.0000 mL | Freq: Once | INTRAVENOUS | Status: AC | PRN
  Administered 2023-07-19: 100 mL via INTRAVENOUS

## 2023-07-23 ENCOUNTER — Ambulatory Visit: Payer: Self-pay | Admitting: General Surgery

## 2023-07-29 NOTE — Progress Notes (Signed)
 [facility] [address] [place] [address] Phone: [phone] Subjective:    I'm seeing this patient by the request  of:  [name], Oakley Bellman, MD  CC:   [id number]  4/3/20253 Has not responded well to conservative therapy.  Been going on over a year.  Has done formal physical therapy, multiple injections and continues to have the discomfort and pain and some instability with catching.  Concern at patient's age and other comorbidities including history of back surgeries that if he fell there could be a potential catastrophic injury.  At this point to get MRI of the knee to further evaluate for any other internal derangement that could be contributing.  Patient could be a candidate for potential arthroscopic surgery instead of knee replacement.  We will discuss with patient after imaging to discuss further.     Update 07/30/2023 Ricardo [name] is a 73 y.o. male coming in with complaint of L thumb and R knee pain. Patient states that some days are good and some are not. Has to pay attention to how he walks. Feels bone on bone sensation when knee is in certain position. Also notes instability.    MRI R knee 06/17/2203 Mild tricompartmental osteoarthrosis most notably the medial compartment with chondromalacia and joint space loss. There is either a complex degenerative tear of the posterior horn and body of the medial meniscus with mild fragmentation of the anterior body or possible prior partial meniscectomy with superimposed tear. Clinical correlation. There is moderate reactive joint effusion.   Increased signal and the cruciate ligaments without evidence of a tear.   Small reactive joint effusion.    Past Medical History:  Diagnosis Date   ARTHRITIS, HIP 08/10/2008   BURSITIS, RIGHT SHOULDER 05/20/2008   Degeneration of lumbar or lumbosacral intervertebral disc 11/27/2006   DEGENERATION, CERVICAL DISC 11/27/2006   DIABETES MELLITUS,  TYPE II 04/10/2007   Dyspnea    Epicondylitis    bilateral   Fatigue    HERPES GENITALIS 09/17/2007   Hyperlipemia    Hypertension    off of htn meds x 1.5 years per pt   HYPOTHYROIDISM 10/28/2007   Lateral epicondylitis  of elbow 05/05/2007   MRSA infection    Ear   PONV (postoperative nausea and vomiting)    Pulmonary fibrosis (HCC)    Sciatica 10/28/2007   SHOULDER PAIN, RIGHT 08/18/2008   Past Surgical History:  Procedure Laterality Date   ACHILLES TENDON REPAIR     left   COLONOSCOPY     ELBOW SURGERY  2005   left   herniated lumbar disc  1997, 2011   INGUINAL HERNIA REPAIR Left 05/06/2023   Procedure: OPEN LEFT INGUINAL HERNIA REPAIR WITH MESH;  Surgeon: Aldean Hummingbird, MD;  Location: [facility];  Service: General;  Laterality: Left;   KNEE ARTHROSCOPY  2007   left   ROTATOR CUFF REPAIR  1997   right   Social History   Socioeconomic History   Marital status: Married    Spouse name: Not on file   Number of children: Not on file   Years of education: Not on file   Highest education level: Not on file  Occupational History   Not on file  Tobacco Use   Smoking status: Never   Smokeless tobacco: Never  Vaping Use   Vaping status: Never Used  Substance and Sexual Activity   Alcohol use: Yes    Alcohol/week: 3.0 standard drinks of alcohol  Types: 3 Cans of beer per week    Comment: occ    Drug use: No   Sexual activity: Yes  Other Topics Concern   Not on file  Social History Narrative   Not on file   Social Drivers of Health   Financial Resource Strain: Not on file  Food Insecurity: Low Risk  (01/01/2023)   Received from [facility]   Hunger Vital Sign    Worried About Running Out of Food in the Last Year: Never true    Ran Out of Food in the Last Year: Never true  Transportation Needs: No Transportation Needs (01/01/2023)   Received from [facility]    In the past 12 months, has lack of reliable transportation kept you from medical  appointments, meetings, work or from getting things needed for daily living? : No  Physical Activity: Not on file  Stress: Not on file  Social Connections: Not on file   No Known Allergies Family History  Problem Relation Age of Onset   Diabetes Mother    Heart disease Mother    Heart disease Father    Diabetes Sister    Colon cancer Neg Hx    Esophageal cancer Neg Hx    Rectal cancer Neg Hx    Stomach cancer Neg Hx     Current Outpatient Medications (Endocrine & Metabolic):    Dulaglutide  (TRULICITY ) 1.5 MG/0.5ML SOAJ, Inject 1.5 mg into the skin once a week.   empagliflozin  (JARDIANCE ) 25 MG TABS tablet, Take 1 tablet (25 mg total) by mouth daily before breakfast.   insulin  lispro (HUMALOG  KWIKPEN) 100 UNIT/ML KwikPen, 151 - 190: 1 unit, 191 - 230: 2 units, 231 - 270: 3 units, 271 - 310: 4 units, 311 - 350: 5 units, 351 - 390: 6 units,  391 - 430: 7 units   levothyroxine  (SYNTHROID ) 100 MCG tablet, TAKE 1 TABLET BY MOUTH EVERY DAY   metFORMIN  (GLUCOPHAGE ) 1000 MG tablet, Take 1 tablet (1,000 mg total) by mouth 2 (two) times daily with a meal.   glimepiride  (AMARYL ) 1 MG tablet, Take 2 tablets (2 mg total) by mouth 2 (two) times daily.  Current Outpatient Medications (Cardiovascular):    atorvastatin  (LIPITOR) 20 MG tablet, TAKE 1 TABLET BY MOUTH EVERY DAY   Current Outpatient Medications (Analgesics):    aspirin 81 MG tablet, Take 81 mg by mouth at bedtime.   Current Outpatient Medications (Other):    alfuzosin (UROXATRAL) 10 MG 24 hr tablet, Take 10 mg by mouth daily.   Blood Glucose Monitoring Suppl (ONETOUCH VERIO) w/Device KIT, Check sugar 3-4 times daily. E11.65   CINNAMON PO, Take 1 tablet by mouth daily. With Berberine   Continuous Glucose Receiver (FREESTYLE LIBRE 3 READER) DEVI, As directed   Continuous Glucose Sensor (FREESTYLE LIBRE 3 PLUS SENSOR) MISC, Change sensor every 15 days.   dutasteride (AVODART) 0.5 MG capsule, Take 0.5 mg by mouth daily.   Lancets  (ONETOUCH ULTRASOFT) lancets, Use as instructed.Check sugar 3-4 times daily. E11.65   MAGNESIUM PO, Take 1 tablet by mouth daily.   ONETOUCH VERIO test strip, USE TO TEST 3 TO 4 TIMES DAILY AS DIRECTED   valACYclovir  (VALTREX ) 500 MG tablet, Take 1 tablet (500 mg total) by mouth daily. (Patient taking differently: Take 500 mg by mouth daily as needed.)   VITAMIN D  PO, Take 1 capsule by mouth daily.   Reviewed prior external information including notes and imaging from  primary care provider As well  as notes that were available from care everywhere and other healthcare systems.  Past medical history, social, surgical and family history all reviewed in electronic medical record.  No pertanent information unless stated regarding to the chief complaint.   Review of Systems:  No headache, visual changes, nausea, vomiting, diarrhea, constipation, dizziness, abdominal pain, skin rash, fevers, chills, night sweats, weight loss, swollen lymph nodes, body aches, joint swelling, chest pain, shortness of breath, mood changes. POSITIVE muscle aches  Objective  Blood pressure 110/74, pulse (!) 59, height 5\' 6"  (1.676 m), SpO2 94%.   General: No apparent distress alert and oriented x3 mood and affect normal, dressed appropriately.  HEENT: Pupils equal, extraocular movements intact  Respiratory: Patient's speak in full sentences and does not appear short of breath  Cardiovascular: No lower extremity edema, non tender, no erythema  Right knee exam does have some arthritic changes noted.  Some tenderness to palpation over the medial joint space.  Mild crepitus noted.  Positive McMurray's noted.  Limited muscular skeletal ultrasound was performed and interpreted by Ronnell Coins, M  Limited ultrasound does show evidence medial meniscus with displacement noted.  Approximately 25%. Impression: Arthritic changes no more of the medial meniscus displacement noted.   Impression and Recommendations:     The  above documentation has been reviewed and is accurate and complete Ricardo [name] M Ricardo Rupert, DO

## 2023-07-30 ENCOUNTER — Encounter: Payer: Self-pay | Admitting: Family Medicine

## 2023-07-30 ENCOUNTER — Ambulatory Visit: Admitting: Family Medicine

## 2023-07-30 ENCOUNTER — Other Ambulatory Visit: Payer: Self-pay

## 2023-07-30 VITALS — BP 110/74 | HR 59 | Ht 66.0 in

## 2023-07-30 DIAGNOSIS — M79645 Pain in left finger(s): Secondary | ICD-10-CM

## 2023-07-30 DIAGNOSIS — S83241A Other tear of medial meniscus, current injury, right knee, initial encounter: Secondary | ICD-10-CM

## 2023-07-30 DIAGNOSIS — G8929 Other chronic pain: Secondary | ICD-10-CM

## 2023-07-30 NOTE — Patient Instructions (Signed)
 Meniscus more than arthritis Glucosamine 1500mg   Turmeric 500 mg Tart cherry 2400mg  at night See me in 2 months

## 2023-07-30 NOTE — Assessment & Plan Note (Addendum)
 Tear noted again, and can consider injection.  Discussed the potential for viscosupplementation and patient does have some arthritic changes noted.  Could be a candidate for this but also think that we should continue to monitor with patient making relatively good range of motion and improvement.  Patient wants to hold on any type of surgical intervention but could be a candidate for potential arthroscopy secondary to not severe amount of arthritis.  Patient will increase activity slowly.  Follow-up with me again in 6 to 8 weeks otherwise, total time discussing with patient as well as his wife 33 minutes

## 2023-08-01 ENCOUNTER — Telehealth: Payer: Self-pay

## 2023-08-01 NOTE — Telephone Encounter (Signed)
 Ran patient for monovisc and faxed pre cert form and clinical notes to patients insurance.   Case [id number]

## 2023-08-01 NOTE — Telephone Encounter (Signed)
 Christopher from [employer] called for patient to ask about the missing information from the form I filled out. Answered the questions and will hear something soon in regard to the authorization

## 2023-08-12 NOTE — Telephone Encounter (Signed)
 Patient scheduled for 10/30/23. This expires on that day so patient will have to be re-ran if he cancels or no shows.   Monovisc authorized for right knee Deductible does not apply Copay $15 OOP MAX $3500 has met $467.39 Once OOP has been met coverage goes to 100% AUTH # [id number] 08/01/23-10/30/23

## 2023-08-12 NOTE — Telephone Encounter (Signed)
 Noted

## 2023-09-03 ENCOUNTER — Ambulatory Visit: Payer: Self-pay | Admitting: Family Medicine

## 2023-09-03 ENCOUNTER — Encounter: Payer: Self-pay | Admitting: Family Medicine

## 2023-09-03 ENCOUNTER — Ambulatory Visit: Admitting: Family Medicine

## 2023-09-03 VITALS — BP 124/74 | HR 64 | Ht 66.0 in | Wt 147.0 lb

## 2023-09-03 DIAGNOSIS — M1710 Unilateral primary osteoarthritis, unspecified knee: Secondary | ICD-10-CM

## 2023-09-03 DIAGNOSIS — M5137 Other intervertebral disc degeneration, lumbosacral region with discogenic back pain only: Secondary | ICD-10-CM | POA: Diagnosis not present

## 2023-09-03 DIAGNOSIS — M2550 Pain in unspecified joint: Secondary | ICD-10-CM

## 2023-09-03 DIAGNOSIS — M1711 Unilateral primary osteoarthritis, right knee: Secondary | ICD-10-CM | POA: Diagnosis not present

## 2023-09-03 LAB — URIC ACID: Uric Acid, Serum: 3.6 mg/dL — ABNORMAL LOW (ref 4.0–7.8)

## 2023-09-03 LAB — COMPREHENSIVE METABOLIC PANEL WITH GFR
ALT: 12 U/L (ref 0–53)
AST: 13 U/L (ref 0–37)
Albumin: 4.3 g/dL (ref 3.5–5.2)
Alkaline Phosphatase: 36 U/L — ABNORMAL LOW (ref 39–117)
BUN: 17 mg/dL (ref 6–23)
CO2: 28 meq/L (ref 19–32)
Calcium: 9 mg/dL (ref 8.4–10.5)
Chloride: 101 meq/L (ref 96–112)
Creatinine, Ser: 1.35 mg/dL (ref 0.40–1.50)
GFR: 52.81 mL/min — ABNORMAL LOW (ref >60.00)
Glucose, Bld: 364 mg/dL — ABNORMAL HIGH (ref 70–99)
Potassium: 4.3 meq/L (ref 3.5–5.1)
Sodium: 137 meq/L (ref 135–145)
Total Bilirubin: 0.5 mg/dL (ref 0.2–1.2)
Total Protein: 6.9 g/dL (ref 6.0–8.3)

## 2023-09-03 LAB — CBC WITH DIFFERENTIAL/PLATELET
Basophils Absolute: 0.1 K/uL (ref 0.0–0.1)
Basophils Relative: 1.2 % (ref 0.0–3.0)
Eosinophils Absolute: 0.2 K/uL (ref 0.0–0.7)
Eosinophils Relative: 4.2 % (ref 0.0–5.0)
HCT: 41 % (ref 39.0–52.0)
Hemoglobin: 13.6 g/dL (ref 13.0–17.0)
Lymphocytes Relative: 22.6 % (ref 12.0–46.0)
Lymphs Abs: 1.3 K/uL (ref 0.7–4.0)
MCHC: 33 g/dL (ref 30.0–36.0)
MCV: 89.6 fl (ref 78.0–100.0)
Monocytes Absolute: 0.6 K/uL (ref 0.1–1.0)
Monocytes Relative: 10.1 % (ref 3.0–12.0)
Neutro Abs: 3.5 K/uL (ref 1.4–7.7)
Neutrophils Relative %: 61.9 % (ref 43.0–77.0)
Platelets: 209 K/uL (ref 150.0–400.0)
RBC: 4.58 Mil/uL (ref 4.22–5.81)
RDW: 14.1 % (ref 11.5–15.5)
WBC: 5.7 K/uL (ref 4.0–10.5)

## 2023-09-03 LAB — FERRITIN: Ferritin: 49.4 ng/mL (ref 22.0–322.0)

## 2023-09-03 LAB — IBC PANEL
Iron: 96 ug/dL (ref 42–165)
Saturation Ratios: 29.2 % (ref 20.0–50.0)
TIBC: 329 ug/dL (ref 250.0–450.0)
Transferrin: 235 mg/dL (ref 212.0–360.0)

## 2023-09-03 LAB — VITAMIN B12: Vitamin B-12: 304 pg/mL (ref 211–911)

## 2023-09-03 LAB — SEDIMENTATION RATE: Sed Rate: 12 mm/h (ref 0–20)

## 2023-09-03 LAB — TSH: TSH: 5.92 u[IU]/mL — ABNORMAL HIGH (ref 0.35–5.50)

## 2023-09-03 LAB — TESTOSTERONE: Testosterone: 294.67 ng/dL — ABNORMAL LOW (ref 300.00–890.00)

## 2023-09-03 LAB — VITAMIN D 25 HYDROXY (VIT D DEFICIENCY, FRACTURES): VITD: 29.48 ng/mL — ABNORMAL LOW (ref 30.00–100.00)

## 2023-09-03 MED ORDER — HYALURONAN 88 MG/4ML IX SOSY
88.0000 mg | PREFILLED_SYRINGE | Freq: Once | INTRA_ARTICULAR | Status: AC
  Administered 2023-09-03: 88 mg via INTRA_ARTICULAR

## 2023-09-03 NOTE — Assessment & Plan Note (Signed)
 Patient has described pain for some time.  Will check some labs to make sure nothing else is potentially contributing.

## 2023-09-03 NOTE — Assessment & Plan Note (Signed)
 Known patellofemoral arthritis.

## 2023-09-03 NOTE — Progress Notes (Signed)
 [name] [address] [place] [address] Phone: [phone] Subjective:   Ricardo [name], am serving as a scribe for Dr. Arthea Claudene.  I'm seeing this patient by the request  of:  [name], Ricardo GAILS, MD  CC: Knee pain follow-up  YEP:[id number]  Ricardo [name] is a 73 y.o. male coming in with complaint of right knee pain.  Found to have medial compartment arthritic changes with a complex posterior horn meniscal tear and moderate joint effusion. Pain increasing. Needs more support and finds himself wearing brace more often than not.   C/o spasming throughout entire spine for past 6 months.   R foot is constantly numb. The more he walks the more intense his pain becomes.    CT of the scan did show the patient did have a small right inguinal hernia.  Past Medical History:  Diagnosis Date   ARTHRITIS, HIP 08/10/2008   BURSITIS, RIGHT SHOULDER 05/20/2008   Degeneration of lumbar or lumbosacral intervertebral disc 11/27/2006   DEGENERATION, CERVICAL DISC 11/27/2006   DIABETES MELLITUS, TYPE II 04/10/2007   Dyspnea    Epicondylitis    bilateral   Fatigue    HERPES GENITALIS 09/17/2007   Hyperlipemia    Hypertension    off of htn meds x 1.5 years per pt   HYPOTHYROIDISM 10/28/2007   Lateral epicondylitis  of elbow 05/05/2007   MRSA infection    Ear   PONV (postoperative nausea and vomiting)    Pulmonary fibrosis (HCC)    Sciatica 10/28/2007   SHOULDER PAIN, RIGHT 08/18/2008   Past Surgical History:  Procedure Laterality Date   ACHILLES TENDON REPAIR     left   COLONOSCOPY     ELBOW SURGERY  2005   left   herniated lumbar disc  1997, 2011   INGUINAL HERNIA REPAIR Left 05/06/2023   Procedure: OPEN LEFT INGUINAL HERNIA REPAIR WITH MESH;  Surgeon: Ricardo Locus, MD;  Location: [facility];  Service: General;  Laterality: Left;   KNEE ARTHROSCOPY  2007   left   ROTATOR CUFF REPAIR  1997   right   Social History   Socioeconomic  History   Marital status: Married    Spouse name: Not on file   Number of children: Not on file   Years of education: Not on file   Highest education level: Not on file  Occupational History   Not on file  Tobacco Use   Smoking status: Never   Smokeless tobacco: Never  Vaping Use   Vaping status: Never Used  Substance and Sexual Activity   Alcohol use: Yes    Alcohol/week: 3.0 standard drinks of alcohol    Types: 3 Cans of beer per week    Comment: occ    Drug use: No   Sexual activity: Yes  Other Topics Concern   Not on file  Social History Narrative   Not on file   Social Drivers of Health   Financial Resource Strain: Not on file  Food Insecurity: Low Risk  (01/01/2023)   Received from [facility]   Hunger Vital Sign    Within the past 12 months, you worried that your food would run out before you got money to buy more: Never true    Within the past 12 months, the food you bought just didn't last and you didn't have money to get more. : Never true  Transportation Needs: No Transportation Needs (01/01/2023)   Received from [facility]  Transportation    In the past 12 months, has lack of reliable transportation kept you from medical appointments, meetings, work or from getting things needed for daily living? : No  Physical Activity: Not on file  Stress: Not on file  Social Connections: Not on file   No Known Allergies Family History  Problem Relation Age of Onset   Diabetes Mother    Heart disease Mother    Heart disease Father    Diabetes Sister    Colon cancer Neg Hx    Esophageal cancer Neg Hx    Rectal cancer Neg Hx    Stomach cancer Neg Hx     Current Outpatient Medications (Endocrine & Metabolic):    Dulaglutide  (TRULICITY ) 1.5 MG/0.5ML SOAJ, Inject 1.5 mg into the skin once a week.   empagliflozin  (JARDIANCE ) 25 MG TABS tablet, Take 1 tablet (25 mg total) by mouth daily before breakfast.   glimepiride  (AMARYL ) 1 MG tablet, Take 2 tablets (2 mg  total) by mouth 2 (two) times daily.   insulin  lispro (HUMALOG  KWIKPEN) 100 UNIT/ML KwikPen, 151 - 190: 1 unit, 191 - 230: 2 units, 231 - 270: 3 units, 271 - 310: 4 units, 311 - 350: 5 units, 351 - 390: 6 units,  391 - 430: 7 units   levothyroxine  (SYNTHROID ) 100 MCG tablet, TAKE 1 TABLET BY MOUTH EVERY DAY   metFORMIN  (GLUCOPHAGE ) 1000 MG tablet, Take 1 tablet (1,000 mg total) by mouth 2 (two) times daily with a meal.  Current Outpatient Medications (Cardiovascular):    atorvastatin  (LIPITOR) 20 MG tablet, TAKE 1 TABLET BY MOUTH EVERY DAY   Current Outpatient Medications (Analgesics):    aspirin 81 MG tablet, Take 81 mg by mouth at bedtime.   Current Outpatient Medications (Other):    alfuzosin (UROXATRAL) 10 MG 24 hr tablet, Take 10 mg by mouth daily.   Blood Glucose Monitoring Suppl (ONETOUCH VERIO) w/Device KIT, Check sugar 3-4 times daily. E11.65   CINNAMON PO, Take 1 tablet by mouth daily. With Berberine   Continuous Glucose Receiver (FREESTYLE LIBRE 3 READER) DEVI, As directed   Continuous Glucose Sensor (FREESTYLE LIBRE 3 PLUS SENSOR) MISC, Change sensor every 15 days.   dutasteride (AVODART) 0.5 MG capsule, Take 0.5 mg by mouth daily.   Lancets (ONETOUCH ULTRASOFT) lancets, Use as instructed.Check sugar 3-4 times daily. E11.65   MAGNESIUM PO, Take 1 tablet by mouth daily.   ONETOUCH VERIO test strip, USE TO TEST 3 TO 4 TIMES DAILY AS DIRECTED   valACYclovir  (VALTREX ) 500 MG tablet, Take 1 tablet (500 mg total) by mouth daily. (Patient taking differently: Take 500 mg by mouth daily as needed.)   VITAMIN D  PO, Take 1 capsule by mouth daily.   Reviewed prior external information including notes and imaging from  primary care provider As well as notes that were available from care everywhere and other healthcare systems.  Past medical history, social, surgical and family history all reviewed in electronic medical record.  No pertanent information unless stated regarding to the  chief complaint.   Review of Systems:  No headache, visual changes, nausea, vomiting, diarrhea, constipation, dizziness, abdominal pain, skin rash, fevers, chills, night sweats, weight loss, swollen lymph nodes, body aches, joint swelling, chest pain, shortness of breath, mood changes. POSITIVE muscle aches  Objective  There were no vitals taken for this visit.   General: No apparent distress alert and oriented x3 mood and affect normal, dressed appropriately.  HEENT: Pupils equal, extraocular movements intact  Respiratory:  Patient's speak in full sentences and does not appear short of breath  Cardiovascular: No lower extremity edema, non tender, no erythema  Right knee exam shows mild arthritic changes noted mostly in the medial compartment.  Tender to palpation over the medial joint space.  Some instability with valgus and varus force.   After informed written and verbal consent, patient was seated on exam table. Right knee was prepped with alcohol swab and utilizing anterolateral approach, patient's right knee space was injected with 48 mg per 3 mL of Monovisc (sodium hyaluronate) in a prefilled syringe was injected easily into the knee through a 22-gauge needle..Patient tolerated the procedure well without immediate complications.   Impression and Recommendations:     The above documentation has been reviewed and is accurate and complete Sharian Delia M Alaiyah Bollman, DO

## 2023-09-03 NOTE — Assessment & Plan Note (Signed)
 Patient given injection and tolerated the procedure well, discussed icing regimen of home exercises, discussed which activities to do and which ones to avoid.  Increase activity slowly.  Discussed icing regimen.  Follow-up again in 6 to 8 weeks

## 2023-09-03 NOTE — Patient Instructions (Addendum)
 Gel injection today Good to see you! Labs today See you again in 3 months

## 2023-09-06 LAB — PTH, INTACT AND CALCIUM
Calcium: 6.1 mg/dL — ABNORMAL LOW (ref 8.6–10.3)
PTH: 67 pg/mL (ref 16–77)

## 2023-10-29 NOTE — Progress Notes (Unsigned)
 [name] [address] [place] [address] Phone: [phone] Subjective:   Ricardo [name] Ricardo [name], am serving as a scribe for Dr. Arthea Claudene.  I'm seeing this patient by the request  of:  [name], [name], MD  CC: Right knee and low back pain follow-up  Ricardo [name]  09/03/2023 Patient has described pain for some time.  Will check some labs to make sure nothing else is potentially contributing.     Patient given injection and tolerated the procedure well, discussed icing regimen of home exercises, discussed which activities to do and which ones to avoid.  Increase activity slowly.  Discussed icing regimen.  Follow-up again in 6 to 8 weeks      Update 10/30/2023 Ricardo [name] is a 73 y.o. male coming in with complaint of R knee and lumbar spine pain. Patient states  that his knee is feeling better.   Back pain is the same as last visit. Earlier this summer he had back spasm and muscles have remained sore since then.   Regarding his right knee last exam did have viscosupplementation.   Laboratory workup previously showed significant elevation in glucose and mild dropping of the GFR to 52.  Was to follow-up with primary care.  Also had elevation in TSH.  B12 and vitamin D  were also low. Past Medical History:  Diagnosis Date   ARTHRITIS, HIP 08/10/2008   BURSITIS, RIGHT SHOULDER 05/20/2008   Degeneration of lumbar or lumbosacral intervertebral disc 11/27/2006   DEGENERATION, CERVICAL DISC 11/27/2006   DIABETES MELLITUS, TYPE II 04/10/2007   Dyspnea    Epicondylitis    bilateral   Fatigue    HERPES GENITALIS 09/17/2007   Hyperlipemia    Hypertension    off of htn meds x 1.5 years per pt   HYPOTHYROIDISM 10/28/2007   Lateral epicondylitis  of elbow 05/05/2007   MRSA infection    Ear   PONV (postoperative nausea and vomiting)    Pulmonary fibrosis (HCC)    Sciatica 10/28/2007   SHOULDER PAIN, RIGHT 08/18/2008   Past Surgical  History:  Procedure Laterality Date   ACHILLES TENDON REPAIR     left   COLONOSCOPY     ELBOW SURGERY  2005   left   herniated lumbar disc  1997, 2011   INGUINAL HERNIA REPAIR Left 05/06/2023   Procedure: OPEN LEFT INGUINAL HERNIA REPAIR WITH MESH;  Surgeon: Tanda Locus, MD;  Location: [facility];  Service: General;  Laterality: Left;   KNEE ARTHROSCOPY  2007   left   ROTATOR CUFF REPAIR  1997   right   Social History   Socioeconomic History   Marital status: Married    Spouse name: Not on file   Number of children: Not on file   Years of education: Not on file   Highest education level: Not on file  Occupational History   Not on file  Tobacco Use   Smoking status: Never   Smokeless tobacco: Never  Vaping Use   Vaping status: Never Used  Substance and Sexual Activity   Alcohol use: Yes    Alcohol/week: 3.0 standard drinks of alcohol    Types: 3 Cans of beer per week    Comment: occ    Drug use: No   Sexual activity: Yes  Other Topics Concern   Not on file  Social History Narrative   Not on file   Social Drivers of Health   Financial Resource Strain: Not on file  Food Insecurity: Low Risk  (01/01/2023)   Received from [facility]   Hunger Vital Sign    Within the past 12 months, you worried that your food would run out before you got money to buy more: Never true    Within the past 12 months, the food you bought just didn't last and you didn't have money to get more. : Never true  Transportation Needs: No Transportation Needs (01/01/2023)   Received from [facility]    In the past 12 months, has lack of reliable transportation kept you from medical appointments, meetings, work or from getting things needed for daily living? : No  Physical Activity: Not on file  Stress: Not on file  Social Connections: Not on file   No Known Allergies Family History  Problem Relation Age of Onset   Diabetes Mother    Heart disease Mother    Heart disease  Father    Diabetes Sister    Colon cancer Neg Hx    Esophageal cancer Neg Hx    Rectal cancer Neg Hx    Stomach cancer Neg Hx     Current Outpatient Medications (Endocrine & Metabolic):    Dulaglutide  (TRULICITY ) 1.5 MG/0.5ML SOAJ, Inject 1.5 mg into the skin once a week.   empagliflozin  (JARDIANCE ) 25 MG TABS tablet, Take 1 tablet (25 mg total) by mouth daily before breakfast.   glimepiride  (AMARYL ) 1 MG tablet, Take 2 tablets (2 mg total) by mouth 2 (two) times daily.   insulin  lispro (HUMALOG  KWIKPEN) 100 UNIT/ML KwikPen, 151 - 190: 1 unit, 191 - 230: 2 units, 231 - 270: 3 units, 271 - 310: 4 units, 311 - 350: 5 units, 351 - 390: 6 units,  391 - 430: 7 units   levothyroxine  (SYNTHROID ) 100 MCG tablet, TAKE 1 TABLET BY MOUTH EVERY DAY   metFORMIN  (GLUCOPHAGE ) 1000 MG tablet, Take 1 tablet (1,000 mg total) by mouth 2 (two) times daily with a meal.  Current Outpatient Medications (Cardiovascular):    atorvastatin  (LIPITOR) 20 MG tablet, TAKE 1 TABLET BY MOUTH EVERY DAY   Current Outpatient Medications (Analgesics):    aspirin 81 MG tablet, Take 81 mg by mouth at bedtime.   Current Outpatient Medications (Other):    alfuzosin (UROXATRAL) 10 MG 24 hr tablet, Take 10 mg by mouth daily.   Blood Glucose Monitoring Suppl (ONETOUCH VERIO) w/Device KIT, Check sugar 3-4 times daily. E11.65   CINNAMON PO, Take 1 tablet by mouth daily. With Berberine   Continuous Glucose Receiver (FREESTYLE LIBRE 3 READER) DEVI, As directed   Continuous Glucose Sensor (FREESTYLE LIBRE 3 PLUS SENSOR) MISC, Change sensor every 15 days.   dutasteride (AVODART) 0.5 MG capsule, Take 0.5 mg by mouth daily.   Lancets (ONETOUCH ULTRASOFT) lancets, Use as instructed.Check sugar 3-4 times daily. E11.65   MAGNESIUM PO, Take 1 tablet by mouth daily.   ONETOUCH VERIO test strip, USE TO TEST 3 TO 4 TIMES DAILY AS DIRECTED   valACYclovir  (VALTREX ) 500 MG tablet, Take 1 tablet (500 mg total) by mouth daily. (Patient taking  differently: Take 500 mg by mouth daily as needed.)   VITAMIN D  PO, Take 1 capsule by mouth daily.   Reviewed prior external information including notes and imaging from  primary care provider As well as notes that were available from care everywhere and other healthcare systems.  Past medical history, social, surgical and family history all reviewed in electronic medical record.  No pertanent information unless stated  regarding to the chief complaint.   Review of Systems:  No headache, visual changes, nausea, vomiting, diarrhea, constipation, dizziness, abdominal pain, skin rash, fevers, chills, night sweats, weight loss, swollen lymph nodes, body aches, joint swelling, chest pain, shortness of breath, mood changes. POSITIVE muscle aches  Objective  Blood pressure (!) 132/92, pulse 91, height 5' 6 (1.676 m), weight 145 lb (65.8 kg).   General: No apparent distress alert and oriented x3 mood and affect normal, dressed appropriately.  HEENT: Pupils equal, extraocular movements intact  Respiratory: Patient's speak in full sentences and does not appear short of breath  Cardiovascular: No lower extremity edema, non tender, no erythema  Knee exam shows arthritic changes noted but patient is nontender on exam today.  Back exam shows patient does have some loss of lordosis.  Some CVA tenderness noted.  Patient does have some loss of lordosis that is fairly significant.  Does have some mild numbness noted of the lower extremity.    Impression and Recommendations:     The above documentation has been reviewed and is accurate and complete Ricardo Round M Devyn Sheerin, DO

## 2023-10-30 ENCOUNTER — Ambulatory Visit (INDEPENDENT_AMBULATORY_CARE_PROVIDER_SITE_OTHER)

## 2023-10-30 ENCOUNTER — Ambulatory Visit: Admitting: Family Medicine

## 2023-10-30 ENCOUNTER — Ambulatory Visit: Payer: Self-pay | Admitting: Family Medicine

## 2023-10-30 ENCOUNTER — Encounter: Payer: Self-pay | Admitting: Family Medicine

## 2023-10-30 VITALS — BP 132/92 | HR 91 | Ht 66.0 in | Wt 145.0 lb

## 2023-10-30 DIAGNOSIS — M546 Pain in thoracic spine: Secondary | ICD-10-CM

## 2023-10-30 DIAGNOSIS — R5383 Other fatigue: Secondary | ICD-10-CM

## 2023-10-30 DIAGNOSIS — M545 Low back pain, unspecified: Secondary | ICD-10-CM

## 2023-10-30 LAB — CBC WITH DIFFERENTIAL/PLATELET
Basophils Absolute: 0.1 K/uL (ref 0.0–0.1)
Basophils Relative: 1.4 % (ref 0.0–3.0)
Eosinophils Absolute: 0.3 K/uL (ref 0.0–0.7)
Eosinophils Relative: 5 % (ref 0.0–5.0)
HCT: 44.6 % (ref 39.0–52.0)
Hemoglobin: 14.5 g/dL (ref 13.0–17.0)
Lymphocytes Relative: 20.6 % (ref 12.0–46.0)
Lymphs Abs: 1.2 K/uL (ref 0.7–4.0)
MCHC: 32.4 g/dL (ref 30.0–36.0)
MCV: 90.8 fl (ref 78.0–100.0)
Monocytes Absolute: 0.5 K/uL (ref 0.1–1.0)
Monocytes Relative: 8.5 % (ref 3.0–12.0)
Neutro Abs: 3.8 K/uL (ref 1.4–7.7)
Neutrophils Relative %: 64.5 % (ref 43.0–77.0)
Platelets: 197 K/uL (ref 150.0–400.0)
RBC: 4.91 Mil/uL (ref 4.22–5.81)
RDW: 13.8 % (ref 11.5–15.5)
WBC: 5.9 K/uL (ref 4.0–10.5)

## 2023-10-30 LAB — VITAMIN B12: Vitamin B-12: 268 pg/mL (ref 211–911)

## 2023-10-30 LAB — URINALYSIS
Bilirubin Urine: NEGATIVE
Hgb urine dipstick: NEGATIVE
Ketones, ur: NEGATIVE
Leukocytes,Ua: NEGATIVE
Nitrite: NEGATIVE
Specific Gravity, Urine: 1.015 (ref 1.000–1.030)
Total Protein, Urine: NEGATIVE
Urine Glucose: >=1000 — AB
Urobilinogen, UA: 0.2 (ref 0.0–1.0)
pH: 5.5 (ref 5.0–8.0)

## 2023-10-30 LAB — COMPREHENSIVE METABOLIC PANEL WITH GFR
ALT: 11 U/L (ref 0–53)
AST: 11 U/L (ref 0–37)
Albumin: 4.5 g/dL (ref 3.5–5.2)
Alkaline Phosphatase: 36 U/L — ABNORMAL LOW (ref 39–117)
BUN: 18 mg/dL (ref 6–23)
CO2: 29 meq/L (ref 19–32)
Calcium: 9.7 mg/dL (ref 8.4–10.5)
Chloride: 102 meq/L (ref 96–112)
Creatinine, Ser: 1.22 mg/dL (ref 0.40–1.50)
GFR: 59.56 mL/min — ABNORMAL LOW (ref >60.00)
Glucose, Bld: 183 mg/dL — ABNORMAL HIGH (ref 70–99)
Potassium: 4.6 meq/L (ref 3.5–5.1)
Sodium: 141 meq/L (ref 135–145)
Total Bilirubin: 1 mg/dL (ref 0.2–1.2)
Total Protein: 7.1 g/dL (ref 6.0–8.3)

## 2023-10-30 LAB — PSA: PSA: 2.84 ng/mL (ref 0.10–4.00)

## 2023-10-30 LAB — IBC PANEL
Iron: 119 ug/dL (ref 42–165)
Saturation Ratios: 31 % (ref 20.0–50.0)
TIBC: 383.6 ug/dL (ref 250.0–450.0)
Transferrin: 274 mg/dL (ref 212.0–360.0)

## 2023-10-30 LAB — VITAMIN D 25 HYDROXY (VIT D DEFICIENCY, FRACTURES): VITD: 37.88 ng/mL (ref 30.00–100.00)

## 2023-10-30 LAB — URIC ACID: Uric Acid, Serum: 4.7 mg/dL (ref 4.0–7.8)

## 2023-10-30 LAB — TSH: TSH: 2.75 u[IU]/mL (ref 0.35–5.50)

## 2023-10-30 LAB — C-REACTIVE PROTEIN: CRP: <1 mg/dL (ref 0.5–20.0)

## 2023-10-30 LAB — SEDIMENTATION RATE: Sed Rate: 5 mm/h (ref 0–20)

## 2023-10-30 LAB — FERRITIN: Ferritin: 48.9 ng/mL (ref 22.0–322.0)

## 2023-10-30 LAB — TESTOSTERONE: Testosterone: 507.21 ng/dL (ref 300.00–890.00)

## 2023-10-30 NOTE — Patient Instructions (Addendum)
 Xray and labs today Able to slowly return to gym Have appt in 2 months

## 2023-10-30 NOTE — Assessment & Plan Note (Signed)
 Known history of fusion of the back.  Will get repeat x-rays.  No concern though with patient's history of pulmonary fibrosis, as well as hypothyroidism, elevated PSA, as well as urinary tract infections within the last 6 months make me concerned that there is some other underlying systemic illness that could be potentially contributing.  Discussed icing regimen and home exercises discussed after the labs to make any other significant changes necessary.  May need to treat in multiple different ways.  Follow-up again in 2 months otherwise and patient will tell us  more if other changes occur or symptoms.

## 2023-11-01 LAB — EXTRA SPECIMEN

## 2023-11-01 LAB — RHEUMATOID FACTOR: Rheumatoid fact SerPl-aCnc: <10 [IU]/mL (ref <14)

## 2023-11-01 LAB — ANA: Anti Nuclear Antibody (ANA): NEGATIVE

## 2023-11-01 LAB — CALCIUM, IONIZED: Calcium, Ion: 5.2 mg/dL (ref 4.7–5.5)

## 2023-11-01 LAB — CYCLIC CITRUL PEPTIDE ANTIBODY, IGG: Cyclic Citrullin Peptide Ab: <16 U

## 2023-11-01 LAB — PTH, INTACT AND CALCIUM
Calcium: 9.6 mg/dL (ref 8.6–10.3)
PTH: 24 pg/mL (ref 16–77)

## 2023-11-01 LAB — ANGIOTENSIN CONVERTING ENZYME: Angiotensin-Converting Enzyme: 22 U/L (ref 9–67)

## 2023-11-12 ENCOUNTER — Ambulatory Visit (INDEPENDENT_AMBULATORY_CARE_PROVIDER_SITE_OTHER): Admitting: Internal Medicine

## 2023-11-12 ENCOUNTER — Encounter: Payer: Self-pay | Admitting: Internal Medicine

## 2023-11-12 ENCOUNTER — Other Ambulatory Visit: Payer: Self-pay

## 2023-11-12 ENCOUNTER — Other Ambulatory Visit

## 2023-11-12 VITALS — BP 140/90 | HR 52 | Temp 98.6°F | Ht 66.0 in | Wt 146.0 lb

## 2023-11-12 DIAGNOSIS — E034 Atrophy of thyroid (acquired): Secondary | ICD-10-CM

## 2023-11-12 DIAGNOSIS — E119 Type 2 diabetes mellitus without complications: Secondary | ICD-10-CM | POA: Diagnosis not present

## 2023-11-12 DIAGNOSIS — Z0000 Encounter for general adult medical examination without abnormal findings: Secondary | ICD-10-CM

## 2023-11-12 DIAGNOSIS — Z860100 Personal history of colon polyps, unspecified: Secondary | ICD-10-CM

## 2023-11-12 DIAGNOSIS — N4282 Prostatosis syndrome: Secondary | ICD-10-CM

## 2023-11-12 DIAGNOSIS — Z1211 Encounter for screening for malignant neoplasm of colon: Secondary | ICD-10-CM

## 2023-11-12 MED ORDER — CIPROFLOXACIN HCL 500 MG PO TABS: 500.0000 mg | ORAL_TABLET | Freq: Two times a day (BID) | ORAL | 0 refills | Status: AC

## 2023-11-12 NOTE — Assessment & Plan Note (Addendum)
 We discussed age appropriate health related issues, including available/recomended screening tests and vaccinations. We discussed a need for adhering to healthy diet and exercise. Labs/EKG were reviewed/ordered. All questions were answered. CT ca chest/heart 12/19 score 17 Zostavax suggested Colon is past due (2024) -referral to GI was placed Refused shots

## 2023-11-12 NOTE — Progress Notes (Signed)
 Subjective:  Patient ID: Ricardo [name], male    DOB: 08/12/1950  Age: 73 y.o. MRN: [id number]  CC: Annual Exam (Asking for a recheck of a hernia. )   HPI Ricardo [name] presents for a well exam C/o possible hernia; testicles hurt B x 2d ago.  No fever.  No rash  Outpatient Medications Prior to Visit  Medication Sig Dispense Refill   alfuzosin (UROXATRAL) 10 MG 24 hr tablet Take 10 mg by mouth daily.     aspirin 81 MG tablet Take 81 mg by mouth at bedtime.     atorvastatin  (LIPITOR) 20 MG tablet TAKE 1 TABLET BY MOUTH EVERY DAY 90 tablet 3   Blood Glucose Monitoring Suppl (ONETOUCH VERIO) w/Device KIT Check sugar 3-4 times daily. E11.65 1 kit 0   CINNAMON PO Take 1 tablet by mouth daily. With Berberine     Continuous Glucose Receiver (FREESTYLE LIBRE 3 READER) DEVI As directed 1 each 1   Continuous Glucose Sensor (FREESTYLE LIBRE 3 PLUS SENSOR) MISC Change sensor every 15 days. 2 each 11   Dulaglutide  (TRULICITY ) 1.5 MG/0.5ML SOAJ Inject 1.5 mg into the skin once a week. 6 mL 1   dutasteride (AVODART) 0.5 MG capsule Take 0.5 mg by mouth daily.     empagliflozin  (JARDIANCE ) 25 MG TABS tablet Take 1 tablet (25 mg total) by mouth daily before breakfast. 90 tablet 1   glimepiride  (AMARYL ) 1 MG tablet Take 2 tablets (2 mg total) by mouth 2 (two) times daily. 120 tablet 2   insulin  lispro (HUMALOG  KWIKPEN) 100 UNIT/ML KwikPen 151 - 190: 1 unit, 191 - 230: 2 units, 231 - 270: 3 units, 271 - 310: 4 units, 311 - 350: 5 units, 351 - 390: 6 units,  391 - 430: 7 units 15 mL 11   Lancets (ONETOUCH ULTRASOFT) lancets Use as instructed.Check sugar 3-4 times daily. E11.65 100 each 12   levothyroxine  (SYNTHROID ) 100 MCG tablet TAKE 1 TABLET BY MOUTH EVERY DAY 90 tablet 2   MAGNESIUM PO Take 1 tablet by mouth daily.     metFORMIN  (GLUCOPHAGE ) 1000 MG tablet Take 1 tablet (1,000 mg total) by mouth 2 (two) times daily with a meal. 180 tablet 3   ONETOUCH VERIO test strip USE TO TEST 3 TO 4 TIMES  DAILY AS DIRECTED 400 strip 3   valACYclovir  (VALTREX ) 500 MG tablet Take 1 tablet (500 mg total) by mouth daily. (Patient taking differently: Take 500 mg by mouth daily as needed.) 90 tablet 0   VITAMIN D  PO Take 1 capsule by mouth daily.     No facility-administered medications prior to visit.    ROS: Review of Systems  Constitutional:  Positive for chills and fatigue. Negative for appetite change and unexpected weight change.  HENT:  Negative for congestion, nosebleeds, sneezing, sore throat and trouble swallowing.   Eyes:  Negative for itching and visual disturbance.  Respiratory:  Negative for cough.   Cardiovascular:  Negative for chest pain, palpitations and leg swelling.  Gastrointestinal:  Negative for abdominal distention, blood in stool, diarrhea and nausea.  Genitourinary:  Positive for testicular pain. Negative for frequency and hematuria.  Musculoskeletal:  Negative for back pain, gait problem, joint swelling and neck pain.  Skin:  Negative for rash.  Neurological:  Negative for dizziness, tremors, speech difficulty and weakness.  Psychiatric/Behavioral:  Negative for agitation, dysphoric mood and sleep disturbance. The patient is not nervous/anxious.     Objective:  BP (!) 140/90  Pulse (!) 52   Temp 98.6 F (37 C) (Oral)   Ht 5' 6 (1.676 m)   Wt 146 lb (66.2 kg)   SpO2 94%   BMI 23.57 kg/m   BP Readings from Last 3 Encounters:  11/12/23 (!) 140/90  10/30/23 (!) 132/92  09/03/23 124/74    Wt Readings from Last 3 Encounters:  11/12/23 146 lb (66.2 kg)  10/30/23 145 lb (65.8 kg)  09/03/23 147 lb (66.7 kg)    Physical Exam Constitutional:      General: He is not in acute distress.    Appearance: Normal appearance. He is well-developed.     Comments: NAD  Eyes:     Conjunctiva/sclera: Conjunctivae normal.     Pupils: Pupils are equal, round, and reactive to light.  Neck:     Thyroid : No thyromegaly.     Vascular: No JVD.  Cardiovascular:     Rate  and Rhythm: Normal rate and regular rhythm.     Heart sounds: Normal heart sounds. No murmur heard.    No friction rub. No gallop.  Pulmonary:     Effort: Pulmonary effort is normal. No respiratory distress.     Breath sounds: Normal breath sounds. No wheezing or rales.  Chest:     Chest wall: No tenderness.  Abdominal:     General: Bowel sounds are normal. There is no distension.     Palpations: Abdomen is soft. There is no mass.     Tenderness: There is no abdominal tenderness. There is no guarding or rebound.  Musculoskeletal:        General: No tenderness. Normal range of motion.     Cervical back: Normal range of motion.  Lymphadenopathy:     Cervical: No cervical adenopathy.  Skin:    General: Skin is warm and dry.     Findings: No rash.  Neurological:     Mental Status: He is alert and oriented to person, place, and time.     Cranial Nerves: No cranial nerve deficit.     Motor: No abnormal muscle tone.     Coordination: Coordination normal.     Gait: Gait normal.     Deep Tendon Reflexes: Reflexes are normal and symmetric.  Psychiatric:        Behavior: Behavior normal.        Thought Content: Thought content normal.        Judgment: Judgment normal.   Rectal exam-patient declined: Per gastroenterology Both testicles non-tender No obvious inguinal hernias palpated  Lab Results  Component Value Date   WBC 5.9 10/30/2023   HGB 14.5 10/30/2023   HCT 44.6 10/30/2023   PLT 197.0 10/30/2023   GLUCOSE 183 (H) 10/30/2023   CHOL 190 11/12/2023   TRIG 81.0 11/12/2023   HDL 68.50 11/12/2023   LDLDIRECT 74.0 04/27/2022   LDLCALC 105 (H) 11/12/2023   ALT 11 10/30/2023   AST 11 10/30/2023   NA 141 10/30/2023   K 4.6 10/30/2023   CL 102 10/30/2023   CREATININE 1.22 10/30/2023   BUN 18 10/30/2023   CO2 29 10/30/2023   TSH 4.03 11/12/2023   PSA 2.84 10/30/2023   INR 1.0 04/02/2007   HGBA1C 7.9 (H) 04/29/2023   MICROALBUR <0.7 11/12/2023    CT PELVIS W  CONTRAST Result Date: 07/21/2023 EXAMINATION: CT PELVIS W CONTRAST CLINICAL INDICATION: Male, 73 years old. Left inguinal hernia TECHNIQUE: Axial CT of the pelvis with 100 cc Isovue  300 intravenous contrast. Multiplanar reformations provided. Unless otherwise  specified, incidental thyroid , adrenal, renal lesions do not require dedicated imaging follow up. Additionally, any mentioned pulmonary nodules do not require dedicated imaging follow-up based on the Fleischner guidelines. COMPARISON: FINDINGS: The visualized abdominal aorta is normal in caliber. Scattered atherosclerotic changes are present. The urinary bladder is normal. The prostate is enlarged. Visualized loops of large and small bowel appear within normal limits. No free fluid or adenopathy. Small fat-containing right inguinal hernia noted. This measures 1.5 x 1.6 cm. No left inguinal hernia identified. Lumbar fusion noted. There are degenerative changes of the spine and bony pelvis. IMPRESSION: No left inguinal hernia. Small fat-containing right inguinal hernia. DOSE REDUCTION: This exam was performed according to our departmental dose-optimization program which includes automated exposure control, adjustment of the mA and/or kV according to patient size and/or use of iterative reconstruction technique. Electronically signed by: [place] Engel MD 07/21/2023 10:00 AM EDT RP Workstation: [id number]    Assessment & Plan:   Problem List Items Addressed This Visit     Diabetes type 2, controlled (HCC)   Cont on Janvia, Metformin , Glipizide , Losartan , Lipitor, ASA Elevated A1c.  Increase glimepiride  to 1 mg twice a day increase Trulicity  to 4.5 mg weekly      Relevant Orders   TSH (Completed)   Urinalysis (Completed)   Lipid panel (Completed)   CK (Completed)   Microalbumin / creatinine urine ratio (Completed)   History of colon polyps   Relevant Orders   Ambulatory referral to Gastroenterology   Hypothyroidism   Relevant Orders   TSH  (Completed)   Urinalysis (Completed)   Lipid panel (Completed)   CK (Completed)   Microalbumin / creatinine urine ratio (Completed)   Prostatitis syndrome   Prescribed Cipro  for presumed prostatitis.  Support underwear.  Call if rash appeared      Well adult exam - Primary   We discussed age appropriate health related issues, including available/recomended screening tests and vaccinations. We discussed a need for adhering to healthy diet and exercise. Labs/EKG were reviewed/ordered. All questions were answered. CT ca chest/heart 12/19 score 17 Zostavax suggested Colon is past due (2024) -referral to GI was placed Refused shots      Other Visit Diagnoses       Screening for colon cancer             Meds ordered this encounter  Medications   ciprofloxacin  (CIPRO ) 500 MG tablet    Sig: Take 1 tablet (500 mg total) by mouth 2 (two) times daily.    Dispense:  28 tablet    Refill:  0      Follow-up: Return in about 3 months (around 02/11/2024).  Marolyn Noel, MD

## 2023-11-12 NOTE — Patient Instructions (Addendum)
 Use elastic support briefs

## 2023-11-13 LAB — LIPID PANEL
Cholesterol: 190 mg/dL (ref 0–200)
HDL: 68.5 mg/dL (ref >39.00)
LDL Cholesterol: 105 mg/dL — ABNORMAL HIGH (ref 0–99)
NonHDL: 121.44
Total CHOL/HDL Ratio: 3
Triglycerides: 81 mg/dL (ref 0.0–149.0)
VLDL: 16.2 mg/dL (ref 0.0–40.0)

## 2023-11-13 LAB — URINALYSIS
Bilirubin Urine: NEGATIVE
Hgb urine dipstick: NEGATIVE
Ketones, ur: NEGATIVE
Leukocytes,Ua: NEGATIVE
Nitrite: NEGATIVE
Specific Gravity, Urine: 1.02 (ref 1.000–1.030)
Total Protein, Urine: NEGATIVE
Urine Glucose: >=1000 — AB
Urobilinogen, UA: 0.2 (ref 0.0–1.0)
pH: 5.5 (ref 5.0–8.0)

## 2023-11-13 LAB — TSH: TSH: 4.03 u[IU]/mL (ref 0.35–5.50)

## 2023-11-13 LAB — CK: Total CK: 121 U/L (ref 17–232)

## 2023-11-13 LAB — MICROALBUMIN / CREATININE URINE RATIO
Creatinine,U: 90.8 mg/dL
Microalb Creat Ratio: UNDETERMINED mg/g (ref 0.0–30.0)
Microalb, Ur: <0.7 mg/dL

## 2023-11-15 ENCOUNTER — Ambulatory Visit: Admitting: "Endocrinology

## 2023-11-16 ENCOUNTER — Other Ambulatory Visit: Payer: Self-pay | Admitting: "Endocrinology

## 2023-11-16 ENCOUNTER — Other Ambulatory Visit: Payer: Self-pay | Admitting: Internal Medicine

## 2023-11-16 DIAGNOSIS — E1165 Type 2 diabetes mellitus with hyperglycemia: Secondary | ICD-10-CM

## 2023-11-17 NOTE — Assessment & Plan Note (Signed)
 Prescribed Cipro  for presumed prostatitis.  Support underwear.  Call if rash appeared

## 2023-11-17 NOTE — Assessment & Plan Note (Signed)
Cont on Janvia, Metformin, Glipizide, Losartan, Lipitor, ASA Elevated A1c.  Increase glimepiride to 1 mg twice a day increase Trulicity to 4.5 mg weekly

## 2023-11-18 ENCOUNTER — Ambulatory Visit: Payer: Self-pay | Admitting: Internal Medicine

## 2023-11-18 NOTE — Telephone Encounter (Signed)
 Requested Prescriptions   Pending Prescriptions Disp Refills   JARDIANCE  25 MG TABS tablet [Pharmacy Med Name: JARDIANCE  25 MG TABLET] 90 tablet 1    Sig: TAKE 1 TABLET BY MOUTH DAILY BEFORE BREAKFAST.

## 2023-11-26 ENCOUNTER — Other Ambulatory Visit

## 2023-12-02 ENCOUNTER — Ambulatory Visit: Admitting: "Endocrinology

## 2023-12-09 ENCOUNTER — Ambulatory Visit: Payer: Self-pay

## 2023-12-09 ENCOUNTER — Ambulatory Visit: Admitting: Internal Medicine

## 2023-12-09 ENCOUNTER — Encounter: Payer: Self-pay | Admitting: Internal Medicine

## 2023-12-09 VITALS — BP 130/84 | HR 80 | Temp 98.0°F | Ht 66.0 in | Wt 147.0 lb

## 2023-12-09 DIAGNOSIS — M542 Cervicalgia: Secondary | ICD-10-CM | POA: Diagnosis not present

## 2023-12-09 MED ORDER — KETOROLAC TROMETHAMINE 60 MG/2ML IM SOLN
60.0000 mg | Freq: Once | INTRAMUSCULAR | Status: AC
  Administered 2023-12-09: 60 mg via INTRAMUSCULAR

## 2023-12-09 MED ORDER — METHYLPREDNISOLONE ACETATE 80 MG/ML IJ SUSP
80.0000 mg | Freq: Once | INTRAMUSCULAR | Status: AC
  Administered 2023-12-09: 80 mg via INTRAMUSCULAR

## 2023-12-09 NOTE — Patient Instructions (Addendum)
     You received a steroid injection today and an anti-inflammatory injection.      Medications changes include :   None      Return if symptoms worsen or fail to improve.

## 2023-12-09 NOTE — Telephone Encounter (Signed)
 FYI Only or Action Required?: FYI only for provider.  Patient was last seen in primary care on 11/12/2023 by [name], [name], MD.  Called Nurse Triage reporting Neck Pain.  Symptoms began several days ago.  Interventions attempted: Prescription medications: Tizanadine.  Symptoms are: gradually worsening.  Triage Disposition: See HCP Within 4 Hours (Or PCP Triage)  Patient/caregiver understands and will follow disposition?: Yes    Copied from CRM [id number]. Topic: Clinical - Red Word Triage >> Dec 09, 2023  8:14 AM Suzen RAMAN wrote: Red Word that prompted transfer to Nurse Triage: painful  spasm in neck x3 days requesting an appt Reason for Disposition  [1] SEVERE neck pain (e.g., excruciating, unable to do any normal activities) AND [2] not improved after 2 hours of pain medicine  Answer Assessment - Initial Assessment Questions Denies neck stiffness  1. ONSET: When did the pain begin?      Friday 2. LOCATION: Where does it hurt?      Both sides  3. PATTERN Does the pain come and go, or has it been constant since it started?      Constant 4. SEVERITY: How bad is the pain?  (Scale 0-10; or none or slight stiffness, mild, moderate, severe)     Patient states he can't look left or right without pain, rates pain 8/10 5. RADIATION: Does the pain go anywhere else, shoot into your arms?     No 6. CORD SYMPTOMS: Any weakness or numbness of the arms or legs?     No 7. CAUSE: What do you think is causing the neck pain?     Patient thinks he slept on it wrong and it has gotten worse 8. NECK OVERUSE: Any recent activities that involved turning or twisting the neck?     Slept on it wrong 9. OTHER SYMPTOMS: Do you have any other symptoms? (e.g., headache, fever, chest pain, difficulty breathing, neck swelling)     No  Protocols used: Neck Pain or Stiffness-A-AH

## 2023-12-09 NOTE — Progress Notes (Signed)
 Subjective:    Patient ID: Ricardo [name], male    DOB: Mar 13, 1950, 73 y.o.   MRN: [id number]      HPI Ricardo [name] is here for  Chief Complaint  Patient presents with   Neck Pain    Neck pain and tip of both shoulder blades pain (unable to turn his neck at one point)   Discussed the use of AI scribe software for clinical note transcription with the patient, who gave verbal consent to proceed.  History of Present Illness Ricardo [name] is a 73 year old male who presents with neck pain and muscle spasms.  He has been experiencing severe neck pain and muscle spasms for three days, beginning on Friday morning. The pain is severe, affects both sides of his neck, and extends to the area between his shoulder blades. No radiation of pain to the arms, numbness, tingling, headaches, lightheadedness, dizziness, or weakness in the arms.  He recalls waking up around 4 AM on Friday with a 'little crick' in his neck, which progressively worsened throughout the day. He suspects the pain may have been caused by sleeping in an awkward position. He has experienced similar episodes in the past, which have occasionally required emergency room visits and treatment with injections.  He has taken ibuprofen , which provided some relief, though the pain persists. Standing up straight without moving his neck seems to slightly alleviate the pain. He has not yet tried heat or ice therapy but notes that a heating pad has helped in the past.  In previous episodes, he has received injections, including a combination of steroids and Toradol , which have been effective in relieving his symptoms.  He mentions a past episode in the spring where his entire back was in spasm, causing significant distress and requiring treatment at an emergency facility. He has used muscle relaxers and anti-inflammatory medications like meloxicam  in the past with varying degrees of success.     Medications and allergies reviewed  with patient and updated if appropriate.  Current Outpatient Medications on File Prior to Visit  Medication Sig Dispense Refill   alfuzosin (UROXATRAL) 10 MG 24 hr tablet Take 10 mg by mouth daily.     aspirin 81 MG tablet Take 81 mg by mouth at bedtime.     atorvastatin  (LIPITOR) 20 MG tablet TAKE 1 TABLET BY MOUTH EVERY DAY 90 tablet 3   Blood Glucose Monitoring Suppl (ONETOUCH VERIO) w/Device KIT Check sugar 3-4 times daily. E11.65 1 kit 0   CINNAMON PO Take 1 tablet by mouth daily. With Berberine     ciprofloxacin  (CIPRO ) 500 MG tablet Take 1 tablet (500 mg total) by mouth 2 (two) times daily. 28 tablet 0   Continuous Glucose Receiver (FREESTYLE LIBRE 3 READER) DEVI As directed 1 each 1   Continuous Glucose Sensor (FREESTYLE LIBRE 3 PLUS SENSOR) MISC Change sensor every 15 days. 2 each 11   Dulaglutide  (TRULICITY ) 1.5 MG/0.5ML SOAJ Inject 1.5 mg into the skin once a week. 6 mL 1   dutasteride (AVODART) 0.5 MG capsule Take 0.5 mg by mouth daily.     insulin  lispro (HUMALOG  KWIKPEN) 100 UNIT/ML KwikPen 151 - 190: 1 unit, 191 - 230: 2 units, 231 - 270: 3 units, 271 - 310: 4 units, 311 - 350: 5 units, 351 - 390: 6 units,  391 - 430: 7 units 15 mL 11   JARDIANCE  25 MG TABS tablet TAKE 1 TABLET BY MOUTH DAILY BEFORE BREAKFAST. 90 tablet 1  Lancets (ONETOUCH ULTRASOFT) lancets Use as instructed.Check sugar 3-4 times daily. E11.65 100 each 12   levothyroxine  (SYNTHROID ) 100 MCG tablet TAKE 1 TABLET BY MOUTH EVERY DAY 90 tablet 2   MAGNESIUM PO Take 1 tablet by mouth daily.     metFORMIN  (GLUCOPHAGE ) 1000 MG tablet Take 1 tablet (1,000 mg total) by mouth 2 (two) times daily with a meal. 180 tablet 3   ONETOUCH VERIO test strip USE TO TEST 3 TO 4 TIMES DAILY AS DIRECTED 400 strip 3   valACYclovir  (VALTREX ) 500 MG tablet Take 1 tablet (500 mg total) by mouth daily. (Patient taking differently: Take 500 mg by mouth daily as needed.) 90 tablet 0   VITAMIN D  PO Take 1 capsule by mouth daily.      glimepiride  (AMARYL ) 1 MG tablet Take 2 tablets (2 mg total) by mouth 2 (two) times daily. 120 tablet 2   No current facility-administered medications on file prior to visit.    Review of Systems     Objective:   Vitals:   12/09/23 1439  BP: 130/84  Pulse: 80  Temp: 98 F (36.7 C)  SpO2: 97%   BP Readings from Last 3 Encounters:  12/09/23 130/84  11/12/23 (!) 140/90  10/30/23 (!) 132/92   Wt Readings from Last 3 Encounters:  12/09/23 147 lb (66.7 kg)  11/12/23 146 lb (66.2 kg)  10/30/23 145 lb (65.8 kg)   Body mass index is 23.73 kg/m.    Physical Exam Constitutional:      General: He is not in acute distress.    Appearance: Normal appearance. He is not ill-appearing.  HENT:     Head: Normocephalic and atraumatic.  Musculoskeletal:     Comments: Mild tenderness with palpation of b/l SCM, mild tenderness posterior neck  - no c-spine tenderness, dec ROM due to pain  Skin:    General: Skin is warm and dry.     Findings: No rash.  Neurological:     Mental Status: He is alert.            Assessment & Plan:    Assessment and Plan Assessment & Plan Acute neck pain with muscle spasm Severe bilateral neck pain with muscle spasm affecting daily activities. Previous episodes responded to injections. No neurological symptoms - no headaches, radiculopathy, weakness, numbness/tingling. - Administer Depo-Medrol  80 mg IM x 1 and Toradol  60 mg IM x 1 injections. - Advise heat therapy at home. - Recommend ibuprofen  as needed. - Instruct to contact if further prescription is needed.

## 2023-12-15 ENCOUNTER — Other Ambulatory Visit: Payer: Self-pay | Admitting: "Endocrinology

## 2023-12-30 ENCOUNTER — Other Ambulatory Visit

## 2023-12-30 ENCOUNTER — Ambulatory Visit: Admitting: Family Medicine

## 2024-01-01 ENCOUNTER — Ambulatory Visit: Admitting: "Endocrinology

## 2024-01-15 ENCOUNTER — Ambulatory Visit: Admitting: Family Medicine

## 2024-01-26 ENCOUNTER — Other Ambulatory Visit: Payer: Self-pay | Admitting: "Endocrinology

## 2024-01-26 DIAGNOSIS — E1165 Type 2 diabetes mellitus with hyperglycemia: Secondary | ICD-10-CM

## 2024-01-27 NOTE — Telephone Encounter (Signed)
 Requested Prescriptions   Pending Prescriptions Disp Refills   ONETOUCH VERIO test strip [Pharmacy Med Name: ONE TOUCH VERIO TEST STRIP] 400 strip 3    Sig: USE TO TEST 3 TO 4 TIMES DAILY AS DIRECTED

## 2024-01-27 NOTE — Progress Notes (Unsigned)
 [name] [address] [place] [address] Phone: [phone] Subjective:   Ricardo [name], am serving as a scribe for Dr. Arthea [name].  I'm seeing this patient by the request  of:  Ricardo [name], Ricardo GAILS, MD  CC: Low back pain  YEP:Dlagzrupcz  10/30/2023 Known history of fusion of the back.  Will get repeat x-rays.  No concern though with patient's history of pulmonary fibrosis, as well as hypothyroidism, elevated PSA, as well as urinary tract infections within the last 6 months make me concerned that there is some other underlying systemic illness that could be potentially contributing.  Discussed icing regimen and home exercises discussed after the labs to make any other significant changes necessary.  May need to treat in multiple different ways.  Follow-up again in 2 months otherwise and patient will tell us  more if other changes occur or symptoms.     Update 01/28/2024 Ricardo [name] is a 73 y.o. male coming in with complaint of lumbar spine pain. Patient states that his back is doing ok.   R knee was painful last week but is doing fine this week.   Was found to have a significantly low B12.  Previous x-rays taken in September of the lumbar spine were independently visualized by me showing progression of the arthritis from 2021.  Patient's previous interventions including a posterior fixation at L4-L5 appeared stable  Past Medical History:  Diagnosis Date   ARTHRITIS, HIP 08/10/2008   BURSITIS, RIGHT SHOULDER 05/20/2008   Degeneration of lumbar or lumbosacral intervertebral disc 11/27/2006   DEGENERATION, CERVICAL DISC 11/27/2006   DIABETES MELLITUS, TYPE II 04/10/2007   Dyspnea    Epicondylitis    bilateral   Fatigue    HERPES GENITALIS 09/17/2007   Hyperlipemia    Hypertension    off of htn meds x 1.5 years per pt   HYPOTHYROIDISM 10/28/2007   Lateral epicondylitis  of elbow 05/05/2007   MRSA infection    Ear   PONV  (postoperative nausea and vomiting)    Pulmonary fibrosis (HCC)    Sciatica 10/28/2007   SHOULDER PAIN, RIGHT 08/18/2008   Past Surgical History:  Procedure Laterality Date   ACHILLES TENDON REPAIR     left   COLONOSCOPY     ELBOW SURGERY  2005   left   herniated lumbar disc  1997, 2011   INGUINAL HERNIA REPAIR Left 05/06/2023   Procedure: OPEN LEFT INGUINAL HERNIA REPAIR WITH MESH;  Surgeon: Tanda Locus, MD;  Location: [facility];  Service: General;  Laterality: Left;   KNEE ARTHROSCOPY  2007   left   ROTATOR CUFF REPAIR  1997   right   Social History   Socioeconomic History   Marital status: Married    Spouse name: Not on file   Number of children: Not on file   Years of education: Not on file   Highest education level: Not on file  Occupational History   Not on file  Tobacco Use   Smoking status: Never   Smokeless tobacco: Never  Vaping Use   Vaping status: Never Used  Substance and Sexual Activity   Alcohol use: Yes    Alcohol/week: 3.0 standard drinks of alcohol    Types: 3 Cans of beer per week    Comment: occ    Drug use: No   Sexual activity: Yes  Other Topics Concern   Not on file  Social History Narrative   Not on file  Social Drivers of Corporate Investment Banker Strain: Not on file  Food Insecurity: Low Risk  (01/01/2023)   Received from [facility]   Hunger Vital Sign    Within the past 12 months, you worried that your food would run out before you got money to buy more: Never true    Within the past 12 months, the food you bought just didn't last and you didn't have money to get more. : Never true  Transportation Needs: No Transportation Needs (01/01/2023)   Received from [facility]    In the past 12 months, has lack of reliable transportation kept you from medical appointments, meetings, work or from getting things needed for daily living? : No  Physical Activity: Not on file  Stress: Not on file  Social Connections: Not on  file   No Known Allergies Family History  Problem Relation Age of Onset   Diabetes Mother    Heart disease Mother    Heart disease Father    Diabetes Sister    Colon cancer Neg Hx    Esophageal cancer Neg Hx    Rectal cancer Neg Hx    Stomach cancer Neg Hx     Current Outpatient Medications (Endocrine & Metabolic):    Dulaglutide  (TRULICITY ) 1.5 MG/0.5ML SOAJ, Inject 1.5 mg into the skin once a week.   glimepiride  (AMARYL ) 1 MG tablet, TAKE 2 TABLETS (2 MG TOTAL) BY MOUTH 2 (TWO) TIMES DAILY.   insulin  lispro (HUMALOG  KWIKPEN) 100 UNIT/ML KwikPen, 151 - 190: 1 unit, 191 - 230: 2 units, 231 - 270: 3 units, 271 - 310: 4 units, 311 - 350: 5 units, 351 - 390: 6 units,  391 - 430: 7 units   JARDIANCE  25 MG TABS tablet, TAKE 1 TABLET BY MOUTH DAILY BEFORE BREAKFAST.   levothyroxine  (SYNTHROID ) 100 MCG tablet, TAKE 1 TABLET BY MOUTH EVERY DAY   metFORMIN  (GLUCOPHAGE ) 1000 MG tablet, Take 1 tablet (1,000 mg total) by mouth 2 (two) times daily with a meal.  Current Outpatient Medications (Cardiovascular):    atorvastatin  (LIPITOR) 20 MG tablet, TAKE 1 TABLET BY MOUTH EVERY DAY   Current Outpatient Medications (Analgesics):    aspirin 81 MG tablet, Take 81 mg by mouth at bedtime.   Current Outpatient Medications (Other):    alfuzosin (UROXATRAL) 10 MG 24 hr tablet, Take 10 mg by mouth daily.   Blood Glucose Monitoring Suppl (ONETOUCH VERIO) w/Device KIT, Check sugar 3-4 times daily. E11.65   CINNAMON PO, Take 1 tablet by mouth daily. With Berberine   ciprofloxacin  (CIPRO ) 500 MG tablet, Take 1 tablet (500 mg total) by mouth 2 (two) times daily.   Continuous Glucose Receiver (FREESTYLE LIBRE 3 READER) DEVI, As directed   Continuous Glucose Sensor (FREESTYLE LIBRE 3 PLUS SENSOR) MISC, Change sensor every 15 days.   dutasteride (AVODART) 0.5 MG capsule, Take 0.5 mg by mouth daily.   Lancets (ONETOUCH ULTRASOFT) lancets, Use as instructed.Check sugar 3-4 times daily. E11.65   MAGNESIUM PO,  Take 1 tablet by mouth daily.   ONETOUCH VERIO test strip, USE TO TEST 3 TO 4 TIMES DAILY AS DIRECTED   valACYclovir  (VALTREX ) 500 MG tablet, Take 1 tablet (500 mg total) by mouth daily. (Patient taking differently: Take 500 mg by mouth daily as needed.)   VITAMIN D  PO, Take 1 capsule by mouth daily.    Objective  Blood pressure 122/84, pulse 62, height 5' 6 (1.676 m), weight 145 lb (65.8 kg), SpO2 98%.  General: No apparent distress alert and oriented x3 mood and affect normal, dressed appropriately.  HEENT: Pupils equal, extraocular movements intact  Respiratory: Patient's speak in full sentences and does not appear short of breath  Cardiovascular: No lower extremity edema, non tender, no erythema  Low back exam shows patient does have very mild loss of lordosis.  Right knee does have mild varus deformity but nontender on exam with full range of motion.    Impression and Recommendations:    The above documentation has been reviewed and is accurate and complete Tmya Wigington M Gedalya Jim, DO

## 2024-01-29 ENCOUNTER — Ambulatory Visit: Admitting: Family Medicine

## 2024-01-29 VITALS — BP 122/84 | HR 62 | Ht 66.0 in | Wt 145.0 lb

## 2024-01-29 DIAGNOSIS — S83241D Other tear of medial meniscus, current injury, right knee, subsequent encounter: Secondary | ICD-10-CM

## 2024-01-29 DIAGNOSIS — M1711 Unilateral primary osteoarthritis, right knee: Secondary | ICD-10-CM | POA: Diagnosis not present

## 2024-01-29 NOTE — Assessment & Plan Note (Signed)
 Doing great with conservative therapy, last injection was back in January.  Discussed icing regimen to very slowly.  Follow-up again as needed

## 2024-01-29 NOTE — Assessment & Plan Note (Signed)
 Doing significantly well.  Doing well with no injection needed.  Follow-up as needed

## 2024-02-04 ENCOUNTER — Encounter: Payer: Self-pay | Admitting: Gastroenterology

## 2024-02-07 ENCOUNTER — Other Ambulatory Visit

## 2024-02-11 ENCOUNTER — Ambulatory Visit: Admitting: "Endocrinology

## 2024-03-04 ENCOUNTER — Ambulatory Visit: Admitting: *Deleted

## 2024-03-04 VITALS — Ht 66.0 in | Wt 140.0 lb

## 2024-03-04 DIAGNOSIS — Z860100 Personal history of colon polyps, unspecified: Secondary | ICD-10-CM

## 2024-03-04 MED ORDER — NA SULFATE-K SULFATE-MG SULF 17.5-3.13-1.6 GM/177ML PO SOLN: 1.0000 | Freq: Once | ORAL | 0 refills | Status: AC

## 2024-03-04 NOTE — Progress Notes (Signed)
 Pt's name and DOB verified at the beginning of the pre-visit with 2 identifiers  Pt denies any difficulty with ambulating,sitting, laying down or rolling side to side  Pt has no issues moving head neck or swallowing  No egg or soy allergy known to patient   No isHX of PONV  No FH of Malignant Hyperthermia  Pt is not on home 02   Pt is not on blood thinners   Pt denies issues with constipation    Pt is not on dialysis  Pt denise any abnormal heart rhythms   Pt denies any upcoming cardiac testing  Patient's chart reviewed by Norleen Schillings CNRA prior to pre-visit and patient appropriate for the [facility].  Pre-visit completed and red dot placed by patient's name on their procedure day (on provider's schedule).    Visit in person  Pt states weight is 140 lb   Pt given  both [facility] main # and MD on call # prior to instructions.  Informed pt to come in at the time discussed and is shown on PV instructions.  Pt instructed to use [employer] or GoodRx for a price reduction on prep  Instructed pt where to find PV instructions in My Chart and copy of instructions given Instructed pt on all aspects of written instructions including med holds clothing to wear and foods to eat and not eat as well as after procedure legal restrictions and to call MD on call if needed.. Pt states understanding. Instructed pt to review instructions again prior to procedure and call main # given if has any questions or any issues. Pt states they will.

## 2024-03-09 ENCOUNTER — Encounter: Payer: Self-pay | Admitting: Gastroenterology

## 2024-03-09 ENCOUNTER — Other Ambulatory Visit

## 2024-03-10 LAB — MICROALBUMIN / CREATININE URINE RATIO
Creatinine, Urine: 83 mg/dL (ref 20–320)
Microalb, Ur: <0.2 mg/dL

## 2024-03-10 LAB — HEMOGLOBIN A1C
Hgb A1c MFr Bld: 8.1 % — ABNORMAL HIGH
Mean Plasma Glucose: 186 mg/dL
eAG (mmol/L): 10.3 mmol/L

## 2024-03-10 LAB — LIPID PANEL
Cholesterol: 206 mg/dL — ABNORMAL HIGH
HDL: 73 mg/dL
LDL Cholesterol (Calc): 116 mg/dL — ABNORMAL HIGH
Non-HDL Cholesterol (Calc): 133 mg/dL — ABNORMAL HIGH
Total CHOL/HDL Ratio: 2.8 (calc)
Triglycerides: 74 mg/dL

## 2024-03-11 ENCOUNTER — Encounter: Payer: Self-pay | Admitting: "Endocrinology

## 2024-03-11 ENCOUNTER — Ambulatory Visit (INDEPENDENT_AMBULATORY_CARE_PROVIDER_SITE_OTHER): Admitting: "Endocrinology

## 2024-03-11 VITALS — BP 120/84 | HR 68 | Ht 66.0 in | Wt 145.0 lb

## 2024-03-11 DIAGNOSIS — Z7984 Long term (current) use of oral hypoglycemic drugs: Secondary | ICD-10-CM

## 2024-03-11 DIAGNOSIS — E1165 Type 2 diabetes mellitus with hyperglycemia: Secondary | ICD-10-CM | POA: Diagnosis not present

## 2024-03-11 DIAGNOSIS — E7800 Pure hypercholesterolemia, unspecified: Secondary | ICD-10-CM

## 2024-03-11 MED ORDER — EMPAGLIFLOZIN 25 MG PO TABS: 25.0000 mg | ORAL_TABLET | Freq: Every day | ORAL | 1 refills | Status: AC

## 2024-03-11 MED ORDER — GLIMEPIRIDE 1 MG PO TABS: 2.0000 mg | ORAL_TABLET | Freq: Two times a day (BID) | ORAL | 0 refills | Status: AC

## 2024-03-11 NOTE — Patient Instructions (Addendum)
 Will recommend the following: metformin  1 g twice a day glimepiride  1 mg: 2 pills twice a day: skip if blood sugar us  running less than 80, take 1 pill if blood sugar is less than 100 jardiance  25 mg every day   ________ Goals of DM therapy:  Morning Fasting blood sugar: 80-140  Blood sugar before meals: 80-140 Bed time blood sugar: 100-150  A1C <7%, limited only by hypoglycemia  1.Diabetes medications and their side effects discussed, including hypoglycemia    2. Check blood glucose:  a) Always check blood sugars before driving. Please see below (under hypoglycemia) on how to manage b) Check a minimum of 3 times/day or more as needed when having symptoms of hypoglycemia.   c) Try to check blood glucose before sleeping/in the middle of the night to ensure that it is remaining stable and not dropping less than 100 d) Check blood glucose more often if sick  3. Diet: a) 3 meals per day schedule b: Restrict carbs to 60-70 grams (4 servings) per meal c) Colorful vegetables - 3 servings a day, and low sugar fruit 2 servings/day Plate control method: 1/4 plate protein, 1/4 starch, 1/2 green, yellow, or red vegetables d) Avoid carbohydrate snacks unless hypoglycemic episode, or increased physical activity  4. Regular exercise as tolerated, preferably 3 or more hours a week  5. Hypoglycemia: a)  Do not drive or operate machinery without first testing blood glucose to assure it is over 90 mg%, or if dizzy, lightheaded, not feeling normal, etc, or  if foot or leg is numb or weak. b)  If blood glucose less than 70, take four 5gm Glucose tabs or 15-30 gm Glucose gel.  Repeat every 15 min as needed until blood sugar is >100 mg/dl. If hypoglycemia persists then call 911.   6. Sick day management: a) Check blood glucose more often b) Continue usual therapy if blood sugars are elevated.   7. Contact the doctor immediately if blood glucose is frequently <60 mg/dl, or an episode of severe  hypoglycemia occurs (where someone had to give you glucose/  glucagon or if you passed out from a low blood glucose), or if blood glucose is persistently >350 mg/dl, for further management  8. A change in level of physical activity or exercise and a change in diet may also affect your blood sugar. Check blood sugars more often and call if needed.  Instructions: 1. Bring glucose meter, blood glucose records on every visit for review 2. Continue to follow up with primary care physician and other providers for medical care 3. Yearly eye  and foot exam 4. Please get blood work done prior to the next appointment

## 2024-03-11 NOTE — Progress Notes (Signed)
 "   Outpatient Endocrinology Note Ricardo Birmingham, MD  03/11/2024   NASIR BRIGHT 12-27-1950 [id number]  Referring Provider: Garald [name], MD Primary Care Provider: Garald [name], MD Reason for consultation: Subjective   Assessment & Plan  Diagnoses and all orders for this visit:  Uncontrolled type 2 diabetes mellitus with hyperglycemia, without long-term current use of insulin  (HCC) -     empagliflozin  (JARDIANCE ) 25 MG TABS tablet; Take 1 tablet (25 mg total) by mouth daily before breakfast.  Long term (current) use of oral hypoglycemic drugs  Pure hypercholesterolemia -     Lipid panel  Other orders -     glimepiride  (AMARYL ) 1 MG tablet; Take 2 tablets (2 mg total) by mouth 2 (two) times daily.    Diabetes Type II complicated by neuropathy,  Lab Results  Component Value Date   GFR 59.56 (L) 10/30/2023   Hba1c goal less than 7, current Hba1c is  Lab Results  Component Value Date   HGBA1C 8.1 (H) 03/09/2024   Will recommend the following: metformin  1 g twice a day glimepiride  1 mg: 2 pills twice a day: skip if blood sugar us  running less than 80, take 1 pill if blood sugar is less than 100 jardiance  25 mg every day   Self stopped Trulicity  1.5 and 3 mg weekly due to nausea, patient says he is waiting for his A1C  Did not start Humalog   Was prescribed humalog  based on sliding scale  Correction scale:   151 - 190: 1 unit 191 - 230: 2 units 231 - 270: 3 units 271 - 310: 4 units 311 - 350: 5 units 351 - 390: 6 units 391 - 430: 7 units   Self stopped Trulicity  3 mg weekly due to nausea, mounjaro  not covered   No known contraindications/side effects to any of above medications  -Last LD and Tg are as follows: Lab Results  Component Value Date   LDLCALC 116 (H) 03/09/2024    Lab Results  Component Value Date   TRIG 74 03/09/2024   -On atorvastatin  20 mg every day-non complaint -03/11/24: doesn't want to increase dose wants to be  complaint and repeat lab in 3 mo -Follow low fat diet and exercise   -Blood pressure goal <140/90 - Microalbumin/creatinine goal is < 30 -Last MA/Cr is as follows: Lab Results  Component Value Date   MICROALBUR <0.2 03/09/2024   -not on ACE/ARB  -diet changes including salt restriction -limit eating outside -counseled BP targets per standards of diabetes care -uncontrolled blood pressure can lead to retinopathy, nephropathy and cardiovascular and atherosclerotic heart disease  Reviewed and counseled on: -A1C target -Blood sugar targets -Complications of uncontrolled diabetes  -Checking blood sugar before meals and bedtime and bring log next visit -All medications with mechanism of action and side effects -Hypoglycemia management: rule of 15's, Glucagon Emergency Kit and medical alert ID -low-carb low-fat plate-method diet -At least 20 minutes of physical activity per day -Annual dilated retinal eye exam and foot exam -compliance and follow up needs -follow up as scheduled or earlier if problem gets worse  Call if blood sugar is less than 70 or consistently above 250    Take a 15 gm snack of carbohydrate at bedtime before you go to sleep if your blood sugar is less than 100.    If you are going to fast after midnight for a test or procedure, ask your physician for instructions on how to reduce/decrease your insulin  dose.  Call if blood sugar is less than 70 or consistently above 250  -Treating a low sugar by rule of 15  (15 gms of sugar every 15 min until sugar is more than 70) If you feel your sugar is low, test your sugar to be sure If your sugar is low (less than 70), then take 15 grams of a fast acting Carbohydrate (3-4 glucose tablets or glucose gel or 4 ounces of juice or regular soda) Recheck your sugar 15 min after treating low to make sure it is more than 70 If sugar is still less than 70, treat again with 15 grams of carbohydrate          Don't drive the hour of  hypoglycemia  If unconscious/unable to eat or drink by mouth, use glucagon injection or nasal spray baqsimi and call 911. Can repeat again in 15 min if still unconscious.  Return in about 3 months (around 06/09/2024) for visit and 8 am labs before next visit.   I have reviewed current medications, nurse's notes, allergies, vital signs, past medical and surgical history, family medical history, and social history for this encounter. Counseled patient on symptoms, examination findings, lab findings, imaging results, treatment decisions and monitoring and prognosis. The patient understood the recommendations and agrees with the treatment plan. All questions regarding treatment plan were fully answered.  Ricardo Birmingham, MD  03/11/2024   History of Present Illness HILARY PUNDT is a 74 y.o. year old male who presents for follow up of Type II diabetes mellitus.  Gianlucca C Stumpo was first diagnosed in 2009.   Diabetes education +  Home diabetes regimen: metformin  1 g twice a day glimepiride  1 mg 2 pills bid jardiance  25 mg every day   Past history: At diagnosis he was having symptoms of fatigue.  He was started on glipizide  initially and subsequently metformin  was added.  Further details are not available However he has been somewhat irregular with his follow-up and did not have an A1c with his PCP between 8/13 and 11/15 He was referred here because of rising A1c of 7.5 He had been started on Trulicity  in 4/17 because of gradually increasing A1c He stopped taking Trulicity  in 2018 because of out-of-pocket expense glipizide  ER 2.5 mg  COMPLICATIONS -  MI/Stroke -  retinopathy +  neuropathy -  nephropathy  BLOOD SUGAR DATA CGM interpretation: At today's visit, we reviewed her CGM downloads. The full report is scanned in the media. Reviewing the CGM trends, BG are in target 84% of the time, 3% low (false lows as per patient).   Physical Exam  BP 120/84   Pulse 68   Ht 5' 6  (1.676 m)   Wt 145 lb (65.8 kg)   SpO2 98%   BMI 23.40 kg/m    Constitutional: well developed, well nourished Head: normocephalic, atraumatic Eyes: sclera anicteric, no redness Neck: supple Lungs: normal respiratory effort Neurology: alert and oriented Skin: dry, no appreciable rashes Musculoskeletal: no appreciable defects Psychiatric: normal mood and affect Diabetic Foot Exam - Simple   Simple Foot Form Diabetic Foot exam was performed with the following findings: Yes 03/11/2024 10:18 AM  Visual Inspection No deformities, no ulcerations, no other skin breakdown bilaterally: Yes Sensation Testing Intact to touch and monofilament testing bilaterally: Yes Pulse Check See comments: Yes Comments +B/L dorsalis pedis      Current Medications Patient's Medications  New Prescriptions   No medications on file  Previous Medications   ALFUZOSIN (UROXATRAL) 10 MG 24  HR TABLET    Take 10 mg by mouth daily.   ASPIRIN 81 MG TABLET    Take 81 mg by mouth at bedtime.   ATORVASTATIN  (LIPITOR) 20 MG TABLET    TAKE 1 TABLET BY MOUTH EVERY DAY   BLOOD GLUCOSE MONITORING SUPPL (ONETOUCH VERIO) W/DEVICE KIT    Check sugar 3-4 times daily. E11.65   CINNAMON PO    Take 1 tablet by mouth daily. With Berberine   CIPROFLOXACIN  (CIPRO ) 500 MG TABLET    Take 1 tablet (500 mg total) by mouth 2 (two) times daily.   CONTINUOUS GLUCOSE RECEIVER (FREESTYLE LIBRE 3 READER) DEVI    As directed   CONTINUOUS GLUCOSE SENSOR (FREESTYLE LIBRE 3 PLUS SENSOR) MISC    Change sensor every 15 days.   DUTASTERIDE (AVODART) 0.5 MG CAPSULE    Take 0.5 mg by mouth daily.   INSULIN  LISPRO (HUMALOG  KWIKPEN) 100 UNIT/ML KWIKPEN    151 - 190: 1 unit, 191 - 230: 2 units, 231 - 270: 3 units, 271 - 310: 4 units, 311 - 350: 5 units, 351 - 390: 6 units,  391 - 430: 7 units   LANCETS (ONETOUCH ULTRASOFT) LANCETS    Use as instructed.Check sugar 3-4 times daily. E11.65   LEVOTHYROXINE  (SYNTHROID ) 100 MCG TABLET    TAKE 1 TABLET BY  MOUTH EVERY DAY   MAGNESIUM PO    Take 1 tablet by mouth daily.   METFORMIN  (GLUCOPHAGE ) 1000 MG TABLET    Take 1 tablet (1,000 mg total) by mouth 2 (two) times daily with a meal.   ONETOUCH VERIO TEST STRIP    USE TO TEST 3 TO 4 TIMES DAILY AS DIRECTED   VALACYCLOVIR  (VALTREX ) 500 MG TABLET    Take 1 tablet (500 mg total) by mouth daily.   VITAMIN D  PO    Take 1 capsule by mouth daily.  Modified Medications   Modified Medication Previous Medication   EMPAGLIFLOZIN  (JARDIANCE ) 25 MG TABS TABLET JARDIANCE  25 MG TABS tablet      Take 1 tablet (25 mg total) by mouth daily before breakfast.    TAKE 1 TABLET BY MOUTH DAILY BEFORE BREAKFAST.   GLIMEPIRIDE  (AMARYL ) 1 MG TABLET glimepiride  (AMARYL ) 1 MG tablet      Take 2 tablets (2 mg total) by mouth 2 (two) times daily.    TAKE 2 TABLETS (2 MG TOTAL) BY MOUTH 2 (TWO) TIMES DAILY.  Discontinued Medications   DULAGLUTIDE  (TRULICITY ) 1.5 MG/0.5ML SOAJ    Inject 1.5 mg into the skin once a week.    Allergies No Known Allergies  Past Medical History Past Medical History:  Diagnosis Date   ARTHRITIS, HIP 08/10/2008   BURSITIS, RIGHT SHOULDER 05/20/2008   Degeneration of lumbar or lumbosacral intervertebral disc 11/27/2006   DEGENERATION, CERVICAL DISC 11/27/2006   DIABETES MELLITUS, TYPE II 04/10/2007   Dyspnea    Epicondylitis    bilateral   Fatigue    HERPES GENITALIS 09/17/2007   Hyperlipemia    Hypertension    off of htn meds x 1.5 years per pt   HYPOTHYROIDISM 10/28/2007   Lateral epicondylitis  of elbow 05/05/2007   MRSA infection    Ear   PONV (postoperative nausea and vomiting)    Pulmonary fibrosis (HCC)    Sciatica 10/28/2007   SHOULDER PAIN, RIGHT 08/18/2008    Past Surgical History Past Surgical History:  Procedure Laterality Date   ACHILLES TENDON REPAIR     left   COLONOSCOPY  ELBOW SURGERY  2005   left   herniated lumbar disc  1997, 2011   INGUINAL HERNIA REPAIR Left 05/06/2023   Procedure: OPEN LEFT  INGUINAL HERNIA REPAIR WITH MESH;  Surgeon: Tanda Locus, MD;  Location: [facility];  Service: General;  Laterality: Left;   KNEE ARTHROSCOPY  2007   left   ROTATOR CUFF REPAIR  1997   right    Family History family history includes Diabetes in his mother and sister; Heart disease in his father and mother.  Social History Social History   Socioeconomic History   Marital status: Married    Spouse name: Not on file   Number of children: Not on file   Years of education: Not on file   Highest education level: Not on file  Occupational History   Not on file  Tobacco Use   Smoking status: Never   Smokeless tobacco: Never  Vaping Use   Vaping status: Never Used  Substance and Sexual Activity   Alcohol use: Yes    Alcohol/week: 3.0 standard drinks of alcohol    Types: 3 Cans of beer per week    Comment: occ    Drug use: No   Sexual activity: Yes  Other Topics Concern   Not on file  Social History Narrative   Not on file   Social Drivers of Health   Tobacco Use: Low Risk (03/11/2024)   Patient History    Smoking Tobacco Use: Never    Smokeless Tobacco Use: Never    Passive Exposure: Not on file  Financial Resource Strain: Not on file  Food Insecurity: Low Risk (01/01/2023)   Received from [facility]   Epic    Within the past 12 months, you worried that your food would run out before you got money to buy more: Never true    Within the past 12 months, the food you bought just didn't last and you didn't have money to get more. : Never true  Transportation Needs: No Transportation Needs (01/01/2023)   Received from [facility]    In the past 12 months, has lack of reliable transportation kept you from medical appointments, meetings, work or from getting things needed for daily living? : No  Physical Activity: Not on file  Stress: Not on file  Social Connections: Not on file  Intimate Partner Violence: Not on file  Depression (PHQ2-9): Low Risk (11/12/2023)    Depression (PHQ2-9)    PHQ-2 Score: 0  Alcohol Screen: Not on file  Housing: Unknown (06/06/2023)   Received from [facility]   Epic    Unable to Pay for Housing in the Last Year: Not on file    Number of Times Moved in the Last Year: Not on file    At any time in the past 12 months, were you homeless or living in a shelter (including now)?: No  Utilities: Low Risk (01/01/2023)   Received from [facility]   Utilities    In the past 12 months has the electric, gas, oil, or water  company threatened to shut off services in your home? : No  Health Literacy: Not on file    Lab Results  Component Value Date   HGBA1C 8.1 (H) 03/09/2024   HGBA1C 7.9 (H) 04/29/2023   HGBA1C 7.9 (A) 04/05/2023   Lab Results  Component Value Date   CHOL 206 (H) 03/09/2024   Lab Results  Component Value Date   HDL 73 03/09/2024  Lab Results  Component Value Date   LDLCALC 116 (H) 03/09/2024   Lab Results  Component Value Date   TRIG 74 03/09/2024   Lab Results  Component Value Date   CHOLHDL 2.8 03/09/2024   Lab Results  Component Value Date   CREATININE 1.22 10/30/2023   Lab Results  Component Value Date   GFR 59.56 (L) 10/30/2023   Lab Results  Component Value Date   MICROALBUR <0.2 03/09/2024      Component Value Date/Time   NA 141 10/30/2023 0825   NA 145 (H) 02/05/2018 1342   K 4.6 10/30/2023 0825   CL 102 10/30/2023 0825   CO2 29 10/30/2023 0825   GLUCOSE 183 (H) 10/30/2023 0825   BUN 18 10/30/2023 0825   BUN 13 02/05/2018 1342   CREATININE 1.22 10/30/2023 0825   CALCIUM  9.6 10/30/2023 0825   CALCIUM  9.7 10/30/2023 0825   PROT 7.1 10/30/2023 0825   ALBUMIN 4.5 10/30/2023 0825   AST 11 10/30/2023 0825   ALT 11 10/30/2023 0825   ALKPHOS 36 (L) 10/30/2023 0825   BILITOT 1.0 10/30/2023 0825   GFRNONAA >60 04/29/2023 0859   GFRAA 74 02/05/2018 1342      Latest Ref Rng & Units 10/30/2023    8:25 AM 09/03/2023    2:11 PM 04/29/2023    8:59 AM  BMP   Glucose 70 - 99 mg/dL 816  635  830   BUN 6 - 23 mg/dL 18  17  20    Creatinine 0.40 - 1.50 mg/dL 8.77  8.64  8.82   Sodium 135 - 145 mEq/L 141  137  136   Potassium 3.5 - 5.1 mEq/L 4.6  4.3  4.3   Chloride 96 - 112 mEq/L 102  101  102   CO2 19 - 32 mEq/L 29  28  26    Calcium  8.6 - 10.3 mg/dL 8.4 - 89.4 mg/dL 9.6    9.7  6.1    9.0  9.1        Component Value Date/Time   WBC 5.9 10/30/2023 0825   RBC 4.91 10/30/2023 0825   HGB 14.5 10/30/2023 0825   HCT 44.6 10/30/2023 0825   PLT 197.0 10/30/2023 0825   MCV 90.8 10/30/2023 0825   MCH 29.8 04/29/2023 0859   MCHC 32.4 10/30/2023 0825   RDW 13.8 10/30/2023 0825   LYMPHSABS 1.2 10/30/2023 0825   MONOABS 0.5 10/30/2023 0825   EOSABS 0.3 10/30/2023 0825   BASOSABS 0.1 10/30/2023 0825     Parts of this note may have been dictated using voice recognition software. There may be variances in spelling and vocabulary which are unintentional. Not all errors are proofread. Please notify the dino if any discrepancies are noted or if the meaning of any statement is not clear.   "

## 2024-03-13 ENCOUNTER — Ambulatory Visit: Admitting: "Endocrinology

## 2024-03-16 ENCOUNTER — Encounter: Payer: Self-pay | Admitting: "Endocrinology

## 2024-03-17 ENCOUNTER — Ambulatory Visit

## 2024-03-17 VITALS — BP 142/70 | HR 82 | Ht 66.0 in | Wt 143.4 lb

## 2024-03-17 DIAGNOSIS — Z0000 Encounter for general adult medical examination without abnormal findings: Secondary | ICD-10-CM

## 2024-03-17 NOTE — Progress Notes (Cosign Needed Addendum)
 "  Chief Complaint  Patient presents with   Medicare Wellness     Subjective:   Ricardo [name] is a 74 y.o. male who presents for a [employer] Visit.  Visit info / Clinical Intake: Medicare Wellness Visit Type:: Initial Annual Wellness Visit Persons participating in visit and providing information:: patient Medicare Wellness Visit Mode:: In-person (required for WTM) Interpreter Needed?: No Pre-visit prep was completed: yes AWV questionnaire completed by patient prior to visit?: no Living arrangements:: lives with spouse/significant other Patient's Overall Health Status Rating: good Typical amount of pain: none Does pain affect daily life?: no Are you currently prescribed opioids?: no  Dietary Habits and Nutritional Risks How many meals a day?: 3 Eats fruit and vegetables daily?: yes Most meals are obtained by: having others provide food; preparing own meals In the last 2 weeks, have you had any of the following?: none Diabetic:: (!) yes Any non-healing wounds?: no How often do you check your BS?: continuous glucose monitor Would you like to be referred to a Nutritionist or for Diabetic Management? : no  Functional Status Activities of Daily Living (to include ambulation/medication): Independent Ambulation: Independent with device- listed below Home Assistive Devices/Equipment: Eyeglasses Medication Administration: Independent Home Management (perform basic housework or laundry): Independent Manage your own finances?: yes Primary transportation is: driving Concerns about vision?: no *vision screening is required for WTM* Concerns about hearing?: no  Fall Screening Falls in the past year?: 0 Number of falls in past year: 0 Was there an injury with Fall?: 0 Fall Risk Category Calculator: 0 Patient Fall Risk Level: Low Fall Risk  Fall Risk Patient at Risk for Falls Due to: No Fall Risks Fall risk Follow up: Falls evaluation completed; Falls prevention  discussed  Home and Transportation Safety: All rugs have non-skid backing?: N/A, no rugs All stairs or steps have railings?: yes (outside) Grab bars in the bathtub or shower?: (!) no Have non-skid surface in bathtub or shower?: yes Good home lighting?: yes Regular seat belt use?: yes Hospital stays in the last year:: no  Cognitive Assessment Difficulty concentrating, remembering, or making decisions? : no Will 6CIT or Mini Cog be Completed: yes What year is it?: 0 points What month is it?: 0 points Give patient an address phrase to remember (5 components): [address] [place], [place] About what time is it?: 0 points Count backwards from 20 to 1: 0 points Say the months of the year in reverse: 0 points Repeat the address phrase from earlier: 0 points 6 CIT Score: 0 points  Advance Directives (For Healthcare) Does Patient Have a Medical Advance Directive?: Yes Does patient want to make changes to medical advance directive?: Yes (Inpatient - patient requests chaplain consult to change a medical advance directive) Type of Advance Directive: Healthcare Power of [place]; Living will Copy of Healthcare Power of Attorney in Chart?: No - copy requested Copy of Living Will in Chart?: No - copy requested Would patient like information on creating a medical advance directive?: No - Patient declined  Reviewed/Updated  Reviewed/Updated: Reviewed All (Medical, Surgical, Family, Medications, Allergies, Care Teams, Patient Goals)    Allergies (verified) Patient has no known allergies.   Current Medications (verified) Outpatient Encounter Medications as of 03/17/2024  Medication Sig   alfuzosin (UROXATRAL) 10 MG 24 hr tablet Take 10 mg by mouth daily.   aspirin 81 MG tablet Take 81 mg by mouth at bedtime.   atorvastatin  (LIPITOR) 20 MG tablet TAKE 1 TABLET BY MOUTH EVERY DAY  Blood Glucose Monitoring Suppl (ONETOUCH VERIO) w/Device KIT Check sugar 3-4 times daily. E11.65   CINNAMON PO  Take 1 tablet by mouth daily. With Berberine   Continuous Glucose Receiver (FREESTYLE LIBRE 3 READER) DEVI As directed   Continuous Glucose Sensor (FREESTYLE LIBRE 3 PLUS SENSOR) MISC Change sensor every 15 days.   dutasteride (AVODART) 0.5 MG capsule Take 0.5 mg by mouth daily.   empagliflozin  (JARDIANCE ) 25 MG TABS tablet Take 1 tablet (25 mg total) by mouth daily before breakfast.   glimepiride  (AMARYL ) 1 MG tablet Take 2 tablets (2 mg total) by mouth 2 (two) times daily.   insulin  lispro (HUMALOG  KWIKPEN) 100 UNIT/ML KwikPen 151 - 190: 1 unit, 191 - 230: 2 units, 231 - 270: 3 units, 271 - 310: 4 units, 311 - 350: 5 units, 351 - 390: 6 units,  391 - 430: 7 units   Lancets (ONETOUCH ULTRASOFT) lancets Use as instructed.Check sugar 3-4 times daily. E11.65   levothyroxine  (SYNTHROID ) 100 MCG tablet TAKE 1 TABLET BY MOUTH EVERY DAY   MAGNESIUM PO Take 1 tablet by mouth daily.   metFORMIN  (GLUCOPHAGE ) 1000 MG tablet Take 1 tablet (1,000 mg total) by mouth 2 (two) times daily with a meal.   ONETOUCH VERIO test strip USE TO TEST 3 TO 4 TIMES DAILY AS DIRECTED   valACYclovir  (VALTREX ) 500 MG tablet Take 1 tablet (500 mg total) by mouth daily. (Patient taking differently: Take 500 mg by mouth as needed.)   VITAMIN D  PO Take 1 capsule by mouth daily.   ciprofloxacin  (CIPRO ) 500 MG tablet Take 1 tablet (500 mg total) by mouth 2 (two) times daily. (Patient not taking: Reported on 03/17/2024)   No facility-administered encounter medications on file as of 03/17/2024.    History: Past Medical History:  Diagnosis Date   ARTHRITIS, HIP 08/10/2008   BURSITIS, RIGHT SHOULDER 05/20/2008   Degeneration of lumbar or lumbosacral intervertebral disc 11/27/2006   DEGENERATION, CERVICAL DISC 11/27/2006   DIABETES MELLITUS, TYPE II 04/10/2007   Dyspnea    Epicondylitis    bilateral   Fatigue    HERPES GENITALIS 09/17/2007   Hyperlipemia    Hypertension    off of htn meds x 1.5 years per pt   HYPOTHYROIDISM  10/28/2007   Lateral epicondylitis  of elbow 05/05/2007   MRSA infection    Ear   PONV (postoperative nausea and vomiting)    Pulmonary fibrosis (HCC)    Sciatica 10/28/2007   SHOULDER PAIN, RIGHT 08/18/2008   Past Surgical History:  Procedure Laterality Date   ACHILLES TENDON REPAIR     left   COLONOSCOPY     ELBOW SURGERY  2005   left   herniated lumbar disc  1997, 2011   INGUINAL HERNIA REPAIR Left 05/06/2023   Procedure: OPEN LEFT INGUINAL HERNIA REPAIR WITH MESH;  Surgeon: Tanda Locus, MD;  Location: [facility];  Service: General;  Laterality: Left;   KNEE ARTHROSCOPY  2007   left   ROTATOR CUFF REPAIR  1997   right   Family History  Problem Relation Age of Onset   Diabetes Mother    Heart disease Mother    Heart disease Father    Diabetes Sister    Colon cancer Neg Hx    Esophageal cancer Neg Hx    Rectal cancer Neg Hx    Stomach cancer Neg Hx    Social History   Occupational History   Not on file  Tobacco Use   Smoking status:  Never   Smokeless tobacco: Never  Vaping Use   Vaping status: Never Used  Substance and Sexual Activity   Alcohol use: Yes    Alcohol/week: 4.0 standard drinks of alcohol    Types: 1 Glasses of wine, 3 Cans of beer per week    Comment: occ    Drug use: No   Sexual activity: Yes   Tobacco Counseling Counseling given: No  SDOH Screenings   Food Insecurity: No Food Insecurity (03/17/2024)  Housing: Unknown (03/17/2024)  Transportation Needs: No Transportation Needs (03/17/2024)  Utilities: Not At Risk (03/17/2024)  Depression (PHQ2-9): Low Risk (03/17/2024)  Physical Activity: Sufficiently Active (03/17/2024)  Social Connections: Unknown (03/17/2024)  Stress: No Stress Concern Present (03/17/2024)  Tobacco Use: Low Risk (03/17/2024)  Health Literacy: Adequate Health Literacy (03/17/2024)   See flowsheets for full screening details  Depression Screen PHQ 2 & 9 Depression Scale- Over the past 2 weeks, how often have you been bothered  by any of the following problems? Little interest or pleasure in doing things: 0 Feeling down, depressed, or hopeless (PHQ Adolescent also includes...irritable): 0 PHQ-2 Total Score: 0 Trouble falling or staying asleep, or sleeping too much: 0 Feeling tired or having little energy: 0 Poor appetite or overeating (PHQ Adolescent also includes...weight loss): 0 Feeling bad about yourself - or that you are a failure or have let yourself or your family down: 0 Trouble concentrating on things, such as reading the newspaper or watching television (PHQ Adolescent also includes...like school work): 0 Moving or speaking so slowly that other people could have noticed. Or the opposite - being so fidgety or restless that you have been moving around a lot more than usual: 0 Thoughts that you would be better off dead, or of hurting yourself in some way: 0 PHQ-9 Total Score: 0 If you checked off any problems, how difficult have these problems made it for you to do your work, take care of things at home, or get along with other people?: Not difficult at all  Depression Treatment Depression Interventions/Treatment : EYV7-0 Score <4 Follow-up Not Indicated     Goals Addressed               This Visit's Progress     Patient Stated (pt-stated)        Patient stated he plans to stay active exercising and watch sugar intake.  Also plans to join a gym             Objective:    Today's Vitals   03/17/24 1508  BP: (!) 142/70  Pulse: 82  SpO2: 98%  Weight: 143 lb 6.4 oz (65 kg)  Height: 5' 6 (1.676 m)   Body mass index is 23.15 kg/m.  Hearing/Vision screen Hearing Screening - Comments:: Denies hearing difficulties   Vision Screening - Comments:: Wears eyeglasses for reading - up to date with routine eye exams with KYM Salinas of [facility] Immunizations and Health Maintenance Health Maintenance  Topic Date Due   Zoster Vaccines- Shingrix (1 of 2) Never done   COVID-19 Vaccine (3 - Pfizer  risk series) 04/21/2020   Colonoscopy  04/06/2022   Influenza Vaccine  05/26/2024 (Originally 09/27/2023)   DTaP/Tdap/Td (2 - Tdap) 11/11/2024 (Originally 07/29/2017)   Pneumococcal Vaccine: 50+ Years (1 of 2 - PCV) 11/11/2024 (Originally 02/13/1970)   OPHTHALMOLOGY EXAM  04/01/2024   HEMOGLOBIN A1C  09/06/2024   Diabetic kidney evaluation - eGFR measurement  10/29/2024   Diabetic kidney evaluation - Urine ACR  03/09/2025   FOOT EXAM  03/11/2025   Medicare Annual Wellness (AWV)  03/17/2025   Hepatitis C Screening  Completed   Meningococcal B Vaccine  Aged Out        Assessment/Plan:  This is a routine wellness examination for [place].  Colonoscopy status: scheduled w/Dr Leigh for 03/18/2024  I have recommended that this patient have a immunization for Influenza, Pneumonia, and Shingles but he declines at this time. I have discussed the risks and benefits of this procedure with him. The patient verbalizes understanding.   Patient Care Team: [name], [name], MD as PCP - General (Internal Medicine) Claudene Arthea HERO, DO as Consulting Physician (Family Medicine) Janit Lamar PARAS, MD (Urology) Fleeta Zerita DASEN, MD as Consulting Physician (Ophthalmology)  I have personally reviewed and noted the following in the patients chart:   Medical and social history Use of alcohol, tobacco or illicit drugs  Current medications and supplements including opioid prescriptions. Functional ability and status Nutritional status Physical activity Advanced directives List of other physicians Hospitalizations, surgeries, and ER visits in previous 12 months Vitals Screenings to include cognitive, depression, and falls Referrals and appointments  No orders of the defined types were placed in this encounter.  In addition, I have reviewed and discussed with patient certain preventive protocols, quality metrics, and best practice recommendations. A written personalized care plan for preventive  services as well as general preventive health recommendations were provided to patient.   Verdie HERO Saba, CMA   03/17/2024   Return in 1 year (on 03/17/2025).  After Visit Summary: (In Person-Declined) Patient declined AVS at this time.  Nurse Notes: scheduled an appt w/PCP for 03/2024; scheduled 2027 AWV appt  Medical screening examination/treatment/procedure(s) were performed by non-physician practitioner and as supervising physician I was immediately available for consultation/collaboration.  I agree with above. Karlynn Noel, MD "

## 2024-03-17 NOTE — Patient Instructions (Signed)
 Mr. Ricardo [name],  Thank you for taking the time for your Medicare Wellness Visit. I appreciate your continued commitment to your health goals. Please review the care plan we discussed, and feel free to reach out if I can assist you further.  Please note that Annual Wellness Visits do not include a physical exam. Some assessments may be limited, especially if the visit was conducted virtually. If needed, we may recommend an in-person follow-up with your provider.  Ongoing Care Seeing your primary care provider every 3 to 6 months helps us  monitor your health and provide consistent, personalized care.   Referrals If a referral was made during today's visit and you haven't received any updates within two weeks, please contact the referred provider directly to check on the status.  Recommended Screenings:  Health Maintenance  Topic Date Due   Zoster (Shingles) Vaccine (1 of 2) Never done   COVID-19 Vaccine (3 - Pfizer risk series) 04/21/2020   Colon Cancer Screening  04/06/2022   Flu Shot  05/26/2024*   DTaP/Tdap/Td vaccine (2 - Tdap) 11/11/2024*   Pneumococcal Vaccine for age over 55 (1 of 2 - PCV) 11/11/2024*   Eye exam for diabetics  04/01/2024   Hemoglobin A1C  09/06/2024   Yearly kidney function blood test for diabetes  10/29/2024   Kidney health urinalysis for diabetes  03/09/2025   Complete foot exam   03/11/2025   Medicare Annual Wellness Visit  03/17/2025   Hepatitis C Screening  Completed   Meningitis B Vaccine  Aged Out  *Topic was postponed. The date shown is not the original due date.       05/06/2023    5:47 AM  Advanced Directives  Does Patient Have a Medical Advance Directive? No  Would patient like information on creating a medical advance directive? No - Patient declined    Vision: Annual vision screenings are recommended for early detection of glaucoma, cataracts, and diabetic retinopathy. These exams can also reveal signs of chronic conditions such as diabetes and  high blood pressure.  Dental: Annual dental screenings help detect early signs of oral cancer, gum disease, and other conditions linked to overall health, including heart disease and diabetes.

## 2024-03-18 ENCOUNTER — Ambulatory Visit: Admitting: Gastroenterology

## 2024-03-18 ENCOUNTER — Encounter: Payer: Self-pay | Admitting: Gastroenterology

## 2024-03-18 VITALS — BP 131/70 | HR 55 | Temp 97.4°F | Resp 10 | Ht 66.0 in | Wt 140.0 lb

## 2024-03-18 DIAGNOSIS — K648 Other hemorrhoids: Secondary | ICD-10-CM

## 2024-03-18 DIAGNOSIS — Z860101 Personal history of adenomatous and serrated colon polyps: Secondary | ICD-10-CM

## 2024-03-18 DIAGNOSIS — Z1211 Encounter for screening for malignant neoplasm of colon: Secondary | ICD-10-CM | POA: Diagnosis not present

## 2024-03-18 DIAGNOSIS — D125 Benign neoplasm of sigmoid colon: Secondary | ICD-10-CM

## 2024-03-18 DIAGNOSIS — D120 Benign neoplasm of cecum: Secondary | ICD-10-CM | POA: Diagnosis not present

## 2024-03-18 DIAGNOSIS — K635 Polyp of colon: Secondary | ICD-10-CM | POA: Diagnosis not present

## 2024-03-18 DIAGNOSIS — D123 Benign neoplasm of transverse colon: Secondary | ICD-10-CM

## 2024-03-18 DIAGNOSIS — D122 Benign neoplasm of ascending colon: Secondary | ICD-10-CM | POA: Diagnosis not present

## 2024-03-18 DIAGNOSIS — Z860100 Personal history of colon polyps, unspecified: Secondary | ICD-10-CM

## 2024-03-18 MED ORDER — SODIUM CHLORIDE 0.9 % IV SOLN: 500.0000 mL | Freq: Once | INTRAVENOUS | Status: DC

## 2024-03-18 NOTE — Op Note (Signed)
 [facility] Patient Name: Ricardo [name] Procedure Date: 03/18/2024 8:29 AM MRN: [id number] Endoscopist: Elspeth P. Leigh , MD, [id number] Age: 74 Referring MD:  Date of Birth: [birth year] Gender: Male Account #: [id number] Procedure:                Colonoscopy Indications:              High risk colon cancer surveillance: Personal                            history of colonic polyps - last exam 03/2019 - 5                            adenomas. Double prep used for this exam. Medicines:                Monitored Anesthesia Care Procedure:                Pre-Anesthesia Assessment:                           - Prior to the procedure, a History and Physical                            was performed, and patient medications and                            allergies were reviewed. The patient's tolerance of                            previous anesthesia was also reviewed. The risks                            and benefits of the procedure and the sedation                            options and risks were discussed with the patient.                            All questions were answered, and informed consent                            was obtained. Prior Anticoagulants: The patient has                            taken no anticoagulant or antiplatelet agents. ASA                            Grade Assessment: II - A patient with mild systemic                            disease. After reviewing the risks and benefits,                            the patient was deemed in satisfactory condition to  undergo the procedure.                           After obtaining informed consent, the colonoscope                            was passed under direct vision. Throughout the                            procedure, the patient's blood pressure, pulse, and                            oxygen saturations were monitored continuously. The                            [id number]-[id number]  Colonoscope [id number] was introduced                            through the anus and advanced to the the cecum,                            identified by appendiceal orifice and ileocecal                            valve. The colonoscopy was performed without                            difficulty. The patient tolerated the procedure                            well. The quality of the bowel preparation was                            adequate. The ileocecal valve, appendiceal orifice,                            and rectum were photographed. Scope In: 8:37:59 AM Scope Out: 9:05:18 AM Scope Withdrawal Time: 0 hours 22 minutes 15 seconds  Total Procedure Duration: 0 hours 27 minutes 19 seconds  Findings:                 The perianal and digital rectal examinations were                            normal.                           A large amount of liquid stool was found in the                            entire colon, making visualization difficult.                            Lavage of the colon was performed using copious  amounts of fluid, resulting in clearance with                            adequate visualization.                           A 3 mm polyp was found in the cecum. The polyp was                            sessile. The polyp was removed with a cold snare.                            Resection and retrieval were complete.                           A 3 mm polyp was found in the ascending colon. The                            polyp was sessile. The polyp was removed with a                            cold snare. Resection and retrieval were complete.                           Three sessile polyps were found in the transverse                            colon. The polyps were 2 to 3 mm in size. These                            polyps were removed with a cold snare. Resection                            and retrieval were complete.                           A 3 mm  polyp was found in the sigmoid colon. The                            polyp was sessile. The polyp was removed with a                            cold snare. Resection and retrieval were complete.                           Internal hemorrhoids were found during retroflexion.                           The exam was otherwise without abnormality. Complications:            No immediate complications. Estimated blood loss:  Minimal. Estimated Blood Loss:     Estimated blood loss was minimal. Impression:               - Stool in the entire examined colon leading to                            extensive lavage with adequate views.                           - One 3 mm polyp in the cecum, removed with a cold                            snare. Resected and retrieved.                           - One 3 mm polyp in the ascending colon, removed                            with a cold snare. Resected and retrieved.                           - Three 2 to 3 mm polyps in the transverse colon,                            removed with a cold snare. Resected and retrieved.                           - One 3 mm polyp in the sigmoid colon, removed with                            a cold snare. Resected and retrieved.                           - Internal hemorrhoids.                           - The examination was otherwise normal. Recommendation:           - Patient has a contact number available for                            emergencies. The signs and symptoms of potential                            delayed complications were discussed with the                            patient. Return to normal activities tomorrow.                            Written discharge instructions were provided to the                            patient.                           -  Resume previous diet.                           - Continue present medications.                           - Await pathology  results. Elspeth P. Monik Lins, MD 03/18/2024 9:11:09 AM This report has been signed electronically.

## 2024-03-18 NOTE — Patient Instructions (Signed)

## 2024-03-18 NOTE — Progress Notes (Signed)
 Called to room to assist during endoscopic procedure.  Patient ID and intended procedure confirmed with present staff. Received instructions for my participation in the procedure from the performing physician.

## 2024-03-18 NOTE — Progress Notes (Signed)
 [facility] History and Physical   Primary Care Physician:  Ricardo [name], MD   Reason for Procedure:   History of polyps  Plan:    colonoscopy     HPI: Ricardo [name] is a 74 y.o. male  here for colonoscopy surveillance - 5 adenomas removed 03/2019.   Patient denies any new bowel symptoms at this time - he does have chronic constipation. No family history of colon cancer known. Otherwise feels well without any cardiopulmonary symptoms.   I have discussed risks / benefits of anesthesia and endoscopic procedure with Ricardo [name] and they wish to proceed with the exams as outlined today.   The patient was provided an opportunity to ask questions and all were answered. The patient agreed with the plan.    Past Medical History:  Diagnosis Date   ARTHRITIS, HIP 08/10/2008   BURSITIS, RIGHT SHOULDER 05/20/2008   Degeneration of lumbar or lumbosacral intervertebral disc 11/27/2006   DEGENERATION, CERVICAL DISC 11/27/2006   DIABETES MELLITUS, TYPE II 04/10/2007   Dyspnea    Epicondylitis    bilateral   Fatigue    HERPES GENITALIS 09/17/2007   Hyperlipemia    Hypertension    off of htn meds x 1.5 years per pt   HYPOTHYROIDISM 10/28/2007   Lateral epicondylitis  of elbow 05/05/2007   MRSA infection    Ear   PONV (postoperative nausea and vomiting)    Pulmonary fibrosis (HCC)    Sciatica 10/28/2007   SHOULDER PAIN, RIGHT 08/18/2008    Past Surgical History:  Procedure Laterality Date   ACHILLES TENDON REPAIR     left   COLONOSCOPY     ELBOW SURGERY  2005   left   herniated lumbar disc  1997, 2011   INGUINAL HERNIA REPAIR Left 05/06/2023   Procedure: OPEN LEFT INGUINAL HERNIA REPAIR WITH MESH;  Surgeon: Tanda Locus, MD;  Location: [facility];  Service: General;  Laterality: Left;   KNEE ARTHROSCOPY  2007   left   ROTATOR CUFF REPAIR  1997   right    Prior to Admission medications  Medication Sig Start Date End Date Taking? Authorizing  Provider  alfuzosin (UROXATRAL) 10 MG 24 hr tablet Take 10 mg by mouth daily. 04/07/23  Yes [provider]  atorvastatin  (LIPITOR) 20 MG tablet TAKE 1 TABLET BY MOUTH EVERY DAY 11/18/23  Yes [name], Ricardo V, MD  CINNAMON PO Take 1 tablet by mouth daily. With Berberine   Yes [provider]  dutasteride (AVODART) 0.5 MG capsule Take 0.5 mg by mouth daily.   Yes [provider]  levothyroxine  (SYNTHROID ) 100 MCG tablet TAKE 1 TABLET BY MOUTH EVERY DAY 06/13/23  Yes [name], Ricardo B, [name]  MAGNESIUM PO Take 1 tablet by mouth daily.   Yes [provider]  aspirin 81 MG tablet Take 81 mg by mouth at bedtime. Patient not taking: Reported on 03/18/2024    [provider]  Blood Glucose Monitoring Suppl (ONETOUCH VERIO) w/Device KIT Check sugar 3-4 times daily. E11.65 05/24/20   Ricardo Pacific, MD  ciprofloxacin  (CIPRO ) 500 MG tablet Take 1 tablet (500 mg total) by mouth 2 (two) times daily. Patient not taking: Reported on 03/17/2024 11/12/23   [name], [name], MD  Continuous Glucose Receiver (FREESTYLE LIBRE 3 READER) DEVI As directed 03/23/23   [name], Ricardo V, MD  Continuous Glucose Sensor (FREESTYLE LIBRE 3 PLUS SENSOR) MISC Change sensor every 15 days. 03/23/23   [name], Ricardo V, MD  empagliflozin  (JARDIANCE )  25 MG TABS tablet Take 1 tablet (25 mg total) by mouth daily before breakfast. 03/11/24   Ricardo Ernst, MD  glimepiride  (AMARYL ) 1 MG tablet Take 2 tablets (2 mg total) by mouth 2 (two) times daily. 03/11/24 04/10/24  [name], Komal, MD  insulin  lispro (HUMALOG  KWIKPEN) 100 UNIT/ML KwikPen 151 - 190: 1 unit, 191 - 230: 2 units, 231 - 270: 3 units, 271 - 310: 4 units, 311 - 350: 5 units, 351 - 390: 6 units,  391 - 430: 7 units Patient not taking: Reported on 03/18/2024 02/05/23   Ricardo Ernst, MD  Lancets (ONETOUCH ULTRASOFT) lancets Use as instructed.Check sugar 3-4 times daily. E11.65 05/30/20   Ricardo Pacific, MD  metFORMIN  (GLUCOPHAGE ) 1000 MG  tablet Take 1 tablet (1,000 mg total) by mouth 2 (two) times daily with a meal. 12/06/22   [name], Komal, MD  ONETOUCH VERIO test strip USE TO TEST 3 TO 4 TIMES DAILY AS DIRECTED 01/27/24   [name], Ernst, MD  valACYclovir  (VALTREX ) 500 MG tablet Take 1 tablet (500 mg total) by mouth daily. Patient taking differently: Take 500 mg by mouth as needed. 10/24/21   [name], [name], MD  VITAMIN D  PO Take 1 capsule by mouth daily. Patient not taking: Reported on 03/18/2024    [provider]    Current Outpatient Medications  Medication Sig Dispense Refill   alfuzosin (UROXATRAL) 10 MG 24 hr tablet Take 10 mg by mouth daily.     atorvastatin  (LIPITOR) 20 MG tablet TAKE 1 TABLET BY MOUTH EVERY DAY 90 tablet 3   CINNAMON PO Take 1 tablet by mouth daily. With Berberine     dutasteride (AVODART) 0.5 MG capsule Take 0.5 mg by mouth daily.     levothyroxine  (SYNTHROID ) 100 MCG tablet TAKE 1 TABLET BY MOUTH EVERY DAY 90 tablet 2   MAGNESIUM PO Take 1 tablet by mouth daily.     aspirin 81 MG tablet Take 81 mg by mouth at bedtime. (Patient not taking: Reported on 03/18/2024)     Blood Glucose Monitoring Suppl (ONETOUCH VERIO) w/Device KIT Check sugar 3-4 times daily. E11.65 1 kit 0   ciprofloxacin  (CIPRO ) 500 MG tablet Take 1 tablet (500 mg total) by mouth 2 (two) times daily. (Patient not taking: Reported on 03/17/2024) 28 tablet 0   Continuous Glucose Receiver (FREESTYLE LIBRE 3 READER) DEVI As directed 1 each 1   Continuous Glucose Sensor (FREESTYLE LIBRE 3 PLUS SENSOR) MISC Change sensor every 15 days. 2 each 11   empagliflozin  (JARDIANCE ) 25 MG TABS tablet Take 1 tablet (25 mg total) by mouth daily before breakfast. 90 tablet 1   glimepiride  (AMARYL ) 1 MG tablet Take 2 tablets (2 mg total) by mouth 2 (two) times daily. 360 tablet 0   insulin  lispro (HUMALOG  KWIKPEN) 100 UNIT/ML KwikPen 151 - 190: 1 unit, 191 - 230: 2 units, 231 - 270: 3 units, 271 - 310: 4 units, 311 - 350: 5 units, 351 - 390:  6 units,  391 - 430: 7 units (Patient not taking: Reported on 03/18/2024) 15 mL 11   Lancets (ONETOUCH ULTRASOFT) lancets Use as instructed.Check sugar 3-4 times daily. E11.65 100 each 12   metFORMIN  (GLUCOPHAGE ) 1000 MG tablet Take 1 tablet (1,000 mg total) by mouth 2 (two) times daily with a meal. 180 tablet 3   ONETOUCH VERIO test strip USE TO TEST 3 TO 4 TIMES DAILY AS DIRECTED 400 strip 3   valACYclovir  (VALTREX ) 500 MG tablet Take 1 tablet (500 mg  total) by mouth daily. (Patient taking differently: Take 500 mg by mouth as needed.) 90 tablet 0   VITAMIN D  PO Take 1 capsule by mouth daily. (Patient not taking: Reported on 03/18/2024)     Current Facility-Administered Medications  Medication Dose Route Frequency Provider Last Rate Last Admin   0.9 %  sodium chloride  infusion  500 mL Intravenous Once Patrece Tallie, Elspeth SQUIBB, MD        Allergies as of 03/18/2024   (No Known Allergies)    Family History  Problem Relation Age of Onset   Diabetes Mother    Heart disease Mother    Heart disease Father    Diabetes Sister    Colon cancer Neg Hx    Esophageal cancer Neg Hx    Rectal cancer Neg Hx    Stomach cancer Neg Hx     Social History   Socioeconomic History   Marital status: Married    Spouse name: Not on file   Number of children: Not on file   Years of education: Not on file   Highest education level: Not on file  Occupational History   Not on file  Tobacco Use   Smoking status: Never   Smokeless tobacco: Never  Vaping Use   Vaping status: Never Used  Substance and Sexual Activity   Alcohol use: Yes    Alcohol/week: 4.0 standard drinks of alcohol    Types: 1 Glasses of wine, 3 Cans of beer per week    Comment: occ    Drug use: No   Sexual activity: Yes  Other Topics Concern   Not on file  Social History Narrative   Married   Social Drivers of Health   Tobacco Use: Low Risk (03/18/2024)   Patient History    Smoking Tobacco Use: Never    Smokeless Tobacco Use:  Never    Passive Exposure: Not on file  Financial Resource Strain: Not on file  Food Insecurity: No Food Insecurity (03/17/2024)   Epic    Worried About Programme Researcher, Broadcasting/film/video in the Last Year: Never true    Ran Out of Food in the Last Year: Never true  Transportation Needs: No Transportation Needs (03/17/2024)   Epic    Lack of Transportation (Medical): No    Lack of Transportation (Non-Medical): No  Physical Activity: Sufficiently Active (03/17/2024)   Exercise Vital Sign    Days of Exercise per Week: 5 days    Minutes of Exercise per Session: 30 min  Stress: No Stress Concern Present (03/17/2024)   [employer] of Occupational Health - Occupational Stress Questionnaire    Feeling of Stress: Not at all  Social Connections: Unknown (03/17/2024)   Social Connection and Isolation Panel    Frequency of Communication with Friends and Family: More than three times a week    Frequency of Social Gatherings with Friends and Family: Twice a week    Attends Religious Services: Not on Insurance Claims Handler of Clubs or Organizations: No    Attends Banker Meetings: Never    Marital Status: Married  Catering Manager Violence: Not At Risk (03/17/2024)   Epic    Fear of Current or Ex-Partner: No    Emotionally Abused: No    Physically Abused: No    Sexually Abused: No  Depression (PHQ2-9): Low Risk (03/17/2024)   Depression (PHQ2-9)    PHQ-2 Score: 0  Alcohol Screen: Not on file  Housing: Unknown (03/17/2024)   Epic  Unable to Pay for Housing in the Last Year: No    Number of Times Moved in the Last Year: Not on file    Homeless in the Last Year: No  Utilities: Not At Risk (03/17/2024)   Epic    Threatened with loss of utilities: No  Health Literacy: Adequate Health Literacy (03/17/2024)   B1300 Health Literacy    Frequency of need for help with medical instructions: Never    Review of Systems: All other review of systems negative except as mentioned in the HPI.  Physical  Exam: Vital signs BP 132/80   Pulse 70   Temp (!) 97.4 F (36.3 C) (Skin)   Ht 5' 6 (1.676 m)   Wt 140 lb (63.5 kg)   SpO2 96%   BMI 22.60 kg/m   General:   Alert,  Well-developed, pleasant and cooperative in NAD Lungs:  Clear throughout to auscultation.   Heart:  Regular rate and rhythm Abdomen:  Soft, nontender and nondistended.   Neuro/Psych:  Alert and cooperative. Normal mood and affect. A and O x 3  Marcey Naval, MD [facility]

## 2024-03-18 NOTE — Progress Notes (Signed)
 Sedate, gd SR, tolerated procedure well, VSS, report to RN

## 2024-03-19 ENCOUNTER — Telehealth: Payer: Self-pay

## 2024-03-19 NOTE — Telephone Encounter (Signed)
 No answer on follow up call - voice mail message left

## 2024-03-23 LAB — SURGICAL PATHOLOGY

## 2024-03-24 ENCOUNTER — Ambulatory Visit: Payer: Self-pay | Admitting: Gastroenterology

## 2024-03-26 NOTE — Progress Notes (Unsigned)
 " [facility] [address] [place] [address] Phone: [phone] Subjective:    I'm seeing this patient by the request  of:  [name], [name], MD  CC:   YEP:[id number]  01/29/2024 Doing significantly well.  Doing well with no injection needed.  Follow-up as needed     Doing great with conservative therapy, last injection was back in January.  Discussed icing regimen to very slowly.  Follow-up again as needed    Update 04/01/2024 Ricardo [name] is a 74 y.o. male coming in with complaint of R knee pain. Patient states       Past Medical History:  Diagnosis Date   ARTHRITIS, HIP 08/10/2008   BURSITIS, RIGHT SHOULDER 05/20/2008   Degeneration of lumbar or lumbosacral intervertebral disc 11/27/2006   DEGENERATION, CERVICAL DISC 11/27/2006   DIABETES MELLITUS, TYPE II 04/10/2007   Dyspnea    Epicondylitis    bilateral   Fatigue    HERPES GENITALIS 09/17/2007   Hyperlipemia    Hypertension    off of htn meds x 1.5 years per pt   HYPOTHYROIDISM 10/28/2007   Lateral epicondylitis  of elbow 05/05/2007   MRSA infection    Ear   PONV (postoperative nausea and vomiting)    Pulmonary fibrosis (HCC)    Sciatica 10/28/2007   SHOULDER PAIN, RIGHT 08/18/2008   Past Surgical History:  Procedure Laterality Date   ACHILLES TENDON REPAIR     left   COLONOSCOPY     ELBOW SURGERY  2005   left   herniated lumbar disc  1997, 2011   INGUINAL HERNIA REPAIR Left 05/06/2023   Procedure: OPEN LEFT INGUINAL HERNIA REPAIR WITH MESH;  Surgeon: Tanda Locus, MD;  Location: [facility];  Service: General;  Laterality: Left;   KNEE ARTHROSCOPY  2007   left   ROTATOR CUFF REPAIR  1997   right   Social History   Socioeconomic History   Marital status: Married    Spouse name: Not on file   Number of children: Not on file   Years of education: Not on file   Highest education level: Not on file  Occupational History   Not on file  Tobacco Use    Smoking status: Never   Smokeless tobacco: Never  Vaping Use   Vaping status: Never Used  Substance and Sexual Activity   Alcohol use: Yes    Alcohol/week: 4.0 standard drinks of alcohol    Types: 1 Glasses of wine, 3 Cans of beer per week    Comment: occ    Drug use: No   Sexual activity: Yes  Other Topics Concern   Not on file  Social History Narrative   Married   Social Drivers of Health   Tobacco Use: Low Risk (03/18/2024)   Patient History    Smoking Tobacco Use: Never    Smokeless Tobacco Use: Never    Passive Exposure: Not on file  Financial Resource Strain: Not on file  Food Insecurity: No Food Insecurity (03/17/2024)   Epic    Worried About Programme Researcher, Broadcasting/film/video in the Last Year: Never true    Ran Out of Food in the Last Year: Never true  Transportation Needs: No Transportation Needs (03/17/2024)   Epic    Lack of Transportation (Medical): No    Lack of Transportation (Non-Medical): No  Physical Activity: Sufficiently Active (03/17/2024)   Exercise Vital Sign    Days of Exercise per Week: 5 days  Minutes of Exercise per Session: 30 min  Stress: No Stress Concern Present (03/17/2024)   [employer] of Occupational Health - Occupational Stress Questionnaire    Feeling of Stress: Not at all  Social Connections: Unknown (03/17/2024)   Social Connection and Isolation Panel    Frequency of Communication with Friends and Family: More than three times a week    Frequency of Social Gatherings with Friends and Family: Twice a week    Attends Religious Services: Not on Insurance Claims Handler of Clubs or Organizations: No    Attends Banker Meetings: Never    Marital Status: Married  Depression (PHQ2-9): Low Risk (03/17/2024)   Depression (PHQ2-9)    PHQ-2 Score: 0  Alcohol Screen: Not on file  Housing: Unknown (03/17/2024)   Epic    Unable to Pay for Housing in the Last Year: No    Number of Times Moved in the Last Year: Not on file    Homeless in the  Last Year: No  Utilities: Not At Risk (03/17/2024)   Epic    Threatened with loss of utilities: No  Health Literacy: Adequate Health Literacy (03/17/2024)   B1300 Health Literacy    Frequency of need for help with medical instructions: Never   Allergies[1] Family History  Problem Relation Age of Onset   Diabetes Mother    Heart disease Mother    Heart disease Father    Diabetes Sister    Colon cancer Neg Hx    Esophageal cancer Neg Hx    Rectal cancer Neg Hx    Stomach cancer Neg Hx     Current Outpatient Medications (Endocrine & Metabolic):    empagliflozin  (JARDIANCE ) 25 MG TABS tablet, Take 1 tablet (25 mg total) by mouth daily before breakfast.   glimepiride  (AMARYL ) 1 MG tablet, Take 2 tablets (2 mg total) by mouth 2 (two) times daily.   insulin  lispro (HUMALOG  KWIKPEN) 100 UNIT/ML KwikPen, 151 - 190: 1 unit, 191 - 230: 2 units, 231 - 270: 3 units, 271 - 310: 4 units, 311 - 350: 5 units, 351 - 390: 6 units,  391 - 430: 7 units (Patient not taking: Reported on 03/18/2024)   levothyroxine  (SYNTHROID ) 100 MCG tablet, TAKE 1 TABLET BY MOUTH EVERY DAY   metFORMIN  (GLUCOPHAGE ) 1000 MG tablet, Take 1 tablet (1,000 mg total) by mouth 2 (two) times daily with a meal.  Current Outpatient Medications (Cardiovascular):    atorvastatin  (LIPITOR) 20 MG tablet, TAKE 1 TABLET BY MOUTH EVERY DAY  Current Outpatient Medications (Analgesics):    aspirin 81 MG tablet, Take 81 mg by mouth at bedtime. (Patient not taking: Reported on 03/18/2024)  Current Outpatient Medications (Other):    alfuzosin (UROXATRAL) 10 MG 24 hr tablet, Take 10 mg by mouth daily.   Blood Glucose Monitoring Suppl (ONETOUCH VERIO) w/Device KIT, Check sugar 3-4 times daily. E11.65   CINNAMON PO, Take 1 tablet by mouth daily. With Berberine   ciprofloxacin  (CIPRO ) 500 MG tablet, Take 1 tablet (500 mg total) by mouth 2 (two) times daily. (Patient not taking: Reported on 03/17/2024)   Continuous Glucose Receiver (FREESTYLE LIBRE  3 READER) DEVI, As directed   Continuous Glucose Sensor (FREESTYLE LIBRE 3 PLUS SENSOR) MISC, Change sensor every 15 days.   dutasteride (AVODART) 0.5 MG capsule, Take 0.5 mg by mouth daily.   Lancets (ONETOUCH ULTRASOFT) lancets, Use as instructed.Check sugar 3-4 times daily. E11.65   MAGNESIUM PO, Take 1 tablet by mouth daily.  ONETOUCH VERIO test strip, USE TO TEST 3 TO 4 TIMES DAILY AS DIRECTED   valACYclovir  (VALTREX ) 500 MG tablet, Take 1 tablet (500 mg total) by mouth daily. (Patient taking differently: Take 500 mg by mouth as needed.)   VITAMIN D  PO, Take 1 capsule by mouth daily. (Patient not taking: Reported on 03/18/2024)   Reviewed prior external information including notes and imaging from  primary care provider As well as notes that were available from care everywhere and other healthcare systems.  Past medical history, social, surgical and family history all reviewed in electronic medical record.  No pertanent information unless stated regarding to the chief complaint.   Review of Systems:  No headache, visual changes, nausea, vomiting, diarrhea, constipation, dizziness, abdominal pain, skin rash, fevers, chills, night sweats, weight loss, swollen lymph nodes, body aches, joint swelling, chest pain, shortness of breath, mood changes. POSITIVE muscle aches  Objective  There were no vitals taken for this visit.   General: No apparent distress alert and oriented x3 mood and affect normal, dressed appropriately.  HEENT: Pupils equal, extraocular movements intact  Respiratory: Patient's speak in full sentences and does not appear short of breath  Cardiovascular: No lower extremity edema, non tender, no erythema      Impression and Recommendations:           [1] No Known Allergies  "

## 2024-04-01 ENCOUNTER — Ambulatory Visit: Admitting: Family Medicine

## 2024-04-15 ENCOUNTER — Ambulatory Visit: Admitting: Internal Medicine

## 2024-06-02 ENCOUNTER — Other Ambulatory Visit

## 2024-06-08 ENCOUNTER — Ambulatory Visit: Admitting: "Endocrinology

## 2024-11-12 ENCOUNTER — Encounter: Admitting: Internal Medicine

## 2025-03-19 ENCOUNTER — Ambulatory Visit
